# Patient Record
Sex: Female | Born: 1935
Health system: Southern US, Community
[De-identification: ages and names within clinical notes are randomized; demographics above are authoritative.]

## PROBLEM LIST (undated history)

## (undated) DIAGNOSIS — M51369 Other intervertebral disc degeneration, lumbar region without mention of lumbar back pain or lower extremity pain: Secondary | ICD-10-CM

## (undated) DIAGNOSIS — M5030 Other cervical disc degeneration, unspecified cervical region: Secondary | ICD-10-CM

## (undated) DIAGNOSIS — G2581 Restless legs syndrome: Secondary | ICD-10-CM

## (undated) DIAGNOSIS — R7301 Impaired fasting glucose: Secondary | ICD-10-CM

## (undated) DIAGNOSIS — M47814 Spondylosis without myelopathy or radiculopathy, thoracic region: Secondary | ICD-10-CM

## (undated) DIAGNOSIS — H3530 Unspecified macular degeneration: Secondary | ICD-10-CM

## (undated) DIAGNOSIS — I10 Essential (primary) hypertension: Secondary | ICD-10-CM

## (undated) DIAGNOSIS — E785 Hyperlipidemia, unspecified: Secondary | ICD-10-CM

## (undated) DIAGNOSIS — M5136 Other intervertebral disc degeneration, lumbar region: Secondary | ICD-10-CM

## (undated) DIAGNOSIS — M797 Fibromyalgia: Secondary | ICD-10-CM

## (undated) DIAGNOSIS — E041 Nontoxic single thyroid nodule: Secondary | ICD-10-CM

## (undated) HISTORY — PX: CATARACT EXTRACTION: SUR2

## (undated) HISTORY — DX: Essential (primary) hypertension: I10

## (undated) HISTORY — DX: Other intervertebral disc degeneration, lumbar region: M51.36

## (undated) HISTORY — DX: Hyperlipidemia, unspecified: E78.5

## (undated) HISTORY — PX: TOTAL ABDOMINAL HYSTERECTOMY: SHX209

## (undated) HISTORY — DX: Fibromyalgia: M79.7

## (undated) HISTORY — DX: Restless legs syndrome: G25.81

## (undated) HISTORY — DX: Spondylosis without myelopathy or radiculopathy, thoracic region: M47.814

## (undated) HISTORY — DX: Impaired fasting glucose: R73.01

## (undated) HISTORY — DX: Other intervertebral disc degeneration, lumbar region without mention of lumbar back pain or lower extremity pain: M51.369

## (undated) HISTORY — DX: Unspecified macular degeneration: H35.30

## (undated) HISTORY — DX: Nontoxic single thyroid nodule: E04.1

## (undated) HISTORY — DX: Other cervical disc degeneration, unspecified cervical region: M50.30

---

## 1998-03-10 ENCOUNTER — Other Ambulatory Visit: Admission: RE | Admit: 1998-03-10 | Discharge: 1998-03-10 | Payer: Self-pay | Admitting: Obstetrics and Gynecology

## 1998-03-25 ENCOUNTER — Encounter: Payer: Self-pay | Admitting: Gynecology

## 1998-03-31 ENCOUNTER — Inpatient Hospital Stay (HOSPITAL_COMMUNITY): Admission: RE | Admit: 1998-03-31 | Discharge: 1998-04-02 | Payer: Self-pay | Admitting: Obstetrics and Gynecology

## 2001-07-05 ENCOUNTER — Other Ambulatory Visit: Admission: RE | Admit: 2001-07-05 | Discharge: 2001-07-05 | Payer: Self-pay | Admitting: Obstetrics and Gynecology

## 2001-09-10 ENCOUNTER — Encounter: Payer: Self-pay | Admitting: Cardiology

## 2001-09-10 ENCOUNTER — Encounter: Admission: RE | Admit: 2001-09-10 | Discharge: 2001-09-10 | Payer: Self-pay | Admitting: Cardiology

## 2002-09-24 ENCOUNTER — Encounter: Payer: Self-pay | Admitting: Cardiology

## 2002-09-24 ENCOUNTER — Encounter: Admission: RE | Admit: 2002-09-24 | Discharge: 2002-09-24 | Payer: Self-pay | Admitting: Cardiology

## 2014-07-15 DIAGNOSIS — I10 Essential (primary) hypertension: Secondary | ICD-10-CM | POA: Diagnosis not present

## 2014-07-15 DIAGNOSIS — E041 Nontoxic single thyroid nodule: Secondary | ICD-10-CM | POA: Diagnosis not present

## 2014-07-15 DIAGNOSIS — E119 Type 2 diabetes mellitus without complications: Secondary | ICD-10-CM | POA: Diagnosis not present

## 2014-07-15 DIAGNOSIS — E785 Hyperlipidemia, unspecified: Secondary | ICD-10-CM | POA: Diagnosis not present

## 2014-11-27 DIAGNOSIS — H3531 Nonexudative age-related macular degeneration: Secondary | ICD-10-CM | POA: Diagnosis not present

## 2014-11-27 DIAGNOSIS — Z961 Presence of intraocular lens: Secondary | ICD-10-CM | POA: Diagnosis not present

## 2014-11-27 DIAGNOSIS — H26492 Other secondary cataract, left eye: Secondary | ICD-10-CM | POA: Diagnosis not present

## 2014-11-27 DIAGNOSIS — H35341 Macular cyst, hole, or pseudohole, right eye: Secondary | ICD-10-CM | POA: Diagnosis not present

## 2015-01-19 DIAGNOSIS — Z9181 History of falling: Secondary | ICD-10-CM | POA: Diagnosis not present

## 2015-01-19 DIAGNOSIS — E041 Nontoxic single thyroid nodule: Secondary | ICD-10-CM | POA: Diagnosis not present

## 2015-01-19 DIAGNOSIS — E119 Type 2 diabetes mellitus without complications: Secondary | ICD-10-CM | POA: Diagnosis not present

## 2015-01-19 DIAGNOSIS — Z6825 Body mass index (BMI) 25.0-25.9, adult: Secondary | ICD-10-CM | POA: Diagnosis not present

## 2015-01-19 DIAGNOSIS — E785 Hyperlipidemia, unspecified: Secondary | ICD-10-CM | POA: Diagnosis not present

## 2015-01-19 DIAGNOSIS — Z139 Encounter for screening, unspecified: Secondary | ICD-10-CM | POA: Diagnosis not present

## 2015-01-19 DIAGNOSIS — Z1389 Encounter for screening for other disorder: Secondary | ICD-10-CM | POA: Diagnosis not present

## 2015-01-19 DIAGNOSIS — I10 Essential (primary) hypertension: Secondary | ICD-10-CM | POA: Diagnosis not present

## 2015-03-20 DIAGNOSIS — Z23 Encounter for immunization: Secondary | ICD-10-CM | POA: Diagnosis not present

## 2015-07-27 DIAGNOSIS — Z6825 Body mass index (BMI) 25.0-25.9, adult: Secondary | ICD-10-CM | POA: Diagnosis not present

## 2015-07-27 DIAGNOSIS — E041 Nontoxic single thyroid nodule: Secondary | ICD-10-CM | POA: Diagnosis not present

## 2015-07-27 DIAGNOSIS — R7301 Impaired fasting glucose: Secondary | ICD-10-CM | POA: Diagnosis not present

## 2015-07-27 DIAGNOSIS — I10 Essential (primary) hypertension: Secondary | ICD-10-CM | POA: Diagnosis not present

## 2015-07-27 DIAGNOSIS — E785 Hyperlipidemia, unspecified: Secondary | ICD-10-CM | POA: Diagnosis not present

## 2015-11-19 DIAGNOSIS — L820 Inflamed seborrheic keratosis: Secondary | ICD-10-CM | POA: Diagnosis not present

## 2015-12-10 DIAGNOSIS — H353131 Nonexudative age-related macular degeneration, bilateral, early dry stage: Secondary | ICD-10-CM | POA: Diagnosis not present

## 2015-12-10 DIAGNOSIS — H35341 Macular cyst, hole, or pseudohole, right eye: Secondary | ICD-10-CM | POA: Diagnosis not present

## 2015-12-10 DIAGNOSIS — Z961 Presence of intraocular lens: Secondary | ICD-10-CM | POA: Diagnosis not present

## 2016-01-25 DIAGNOSIS — Z1389 Encounter for screening for other disorder: Secondary | ICD-10-CM | POA: Diagnosis not present

## 2016-01-25 DIAGNOSIS — I10 Essential (primary) hypertension: Secondary | ICD-10-CM | POA: Diagnosis not present

## 2016-01-25 DIAGNOSIS — R7301 Impaired fasting glucose: Secondary | ICD-10-CM | POA: Diagnosis not present

## 2016-01-25 DIAGNOSIS — E041 Nontoxic single thyroid nodule: Secondary | ICD-10-CM | POA: Diagnosis not present

## 2016-01-25 DIAGNOSIS — E785 Hyperlipidemia, unspecified: Secondary | ICD-10-CM | POA: Diagnosis not present

## 2016-01-25 DIAGNOSIS — Z9181 History of falling: Secondary | ICD-10-CM | POA: Diagnosis not present

## 2016-04-08 DIAGNOSIS — Z23 Encounter for immunization: Secondary | ICD-10-CM | POA: Diagnosis not present

## 2016-07-27 DIAGNOSIS — E041 Nontoxic single thyroid nodule: Secondary | ICD-10-CM | POA: Diagnosis not present

## 2016-07-27 DIAGNOSIS — Z6825 Body mass index (BMI) 25.0-25.9, adult: Secondary | ICD-10-CM | POA: Diagnosis not present

## 2016-07-27 DIAGNOSIS — E785 Hyperlipidemia, unspecified: Secondary | ICD-10-CM | POA: Diagnosis not present

## 2016-07-27 DIAGNOSIS — I10 Essential (primary) hypertension: Secondary | ICD-10-CM | POA: Diagnosis not present

## 2016-07-27 DIAGNOSIS — R7301 Impaired fasting glucose: Secondary | ICD-10-CM | POA: Diagnosis not present

## 2016-12-29 DIAGNOSIS — H353132 Nonexudative age-related macular degeneration, bilateral, intermediate dry stage: Secondary | ICD-10-CM | POA: Diagnosis not present

## 2016-12-29 DIAGNOSIS — H43811 Vitreous degeneration, right eye: Secondary | ICD-10-CM | POA: Diagnosis not present

## 2016-12-29 DIAGNOSIS — Z961 Presence of intraocular lens: Secondary | ICD-10-CM | POA: Diagnosis not present

## 2016-12-29 DIAGNOSIS — H35341 Macular cyst, hole, or pseudohole, right eye: Secondary | ICD-10-CM | POA: Diagnosis not present

## 2017-01-25 DIAGNOSIS — Z139 Encounter for screening, unspecified: Secondary | ICD-10-CM | POA: Diagnosis not present

## 2017-01-25 DIAGNOSIS — I10 Essential (primary) hypertension: Secondary | ICD-10-CM | POA: Diagnosis not present

## 2017-01-25 DIAGNOSIS — Z6824 Body mass index (BMI) 24.0-24.9, adult: Secondary | ICD-10-CM | POA: Diagnosis not present

## 2017-01-25 DIAGNOSIS — R7301 Impaired fasting glucose: Secondary | ICD-10-CM | POA: Diagnosis not present

## 2017-01-25 DIAGNOSIS — E785 Hyperlipidemia, unspecified: Secondary | ICD-10-CM | POA: Diagnosis not present

## 2017-01-25 DIAGNOSIS — E041 Nontoxic single thyroid nodule: Secondary | ICD-10-CM | POA: Diagnosis not present

## 2017-05-04 DIAGNOSIS — Z23 Encounter for immunization: Secondary | ICD-10-CM | POA: Diagnosis not present

## 2017-07-31 DIAGNOSIS — R7301 Impaired fasting glucose: Secondary | ICD-10-CM | POA: Diagnosis not present

## 2017-07-31 DIAGNOSIS — Z6824 Body mass index (BMI) 24.0-24.9, adult: Secondary | ICD-10-CM | POA: Diagnosis not present

## 2017-07-31 DIAGNOSIS — E785 Hyperlipidemia, unspecified: Secondary | ICD-10-CM | POA: Diagnosis not present

## 2017-07-31 DIAGNOSIS — I10 Essential (primary) hypertension: Secondary | ICD-10-CM | POA: Diagnosis not present

## 2017-07-31 DIAGNOSIS — E041 Nontoxic single thyroid nodule: Secondary | ICD-10-CM | POA: Diagnosis not present

## 2017-07-31 DIAGNOSIS — Z9181 History of falling: Secondary | ICD-10-CM | POA: Diagnosis not present

## 2017-07-31 DIAGNOSIS — Z1389 Encounter for screening for other disorder: Secondary | ICD-10-CM | POA: Diagnosis not present

## 2017-07-31 DIAGNOSIS — R079 Chest pain, unspecified: Secondary | ICD-10-CM | POA: Diagnosis not present

## 2017-07-31 DIAGNOSIS — Z1331 Encounter for screening for depression: Secondary | ICD-10-CM | POA: Diagnosis not present

## 2017-08-14 DIAGNOSIS — K219 Gastro-esophageal reflux disease without esophagitis: Secondary | ICD-10-CM | POA: Diagnosis not present

## 2017-08-14 DIAGNOSIS — R42 Dizziness and giddiness: Secondary | ICD-10-CM | POA: Diagnosis not present

## 2017-09-15 DIAGNOSIS — G2581 Restless legs syndrome: Secondary | ICD-10-CM | POA: Insufficient documentation

## 2017-09-15 DIAGNOSIS — M5030 Other cervical disc degeneration, unspecified cervical region: Secondary | ICD-10-CM | POA: Insufficient documentation

## 2017-09-15 DIAGNOSIS — E782 Mixed hyperlipidemia: Secondary | ICD-10-CM | POA: Insufficient documentation

## 2017-09-15 DIAGNOSIS — I10 Essential (primary) hypertension: Secondary | ICD-10-CM | POA: Insufficient documentation

## 2017-09-15 DIAGNOSIS — M47814 Spondylosis without myelopathy or radiculopathy, thoracic region: Secondary | ICD-10-CM | POA: Insufficient documentation

## 2017-09-15 DIAGNOSIS — E041 Nontoxic single thyroid nodule: Secondary | ICD-10-CM | POA: Insufficient documentation

## 2017-09-15 DIAGNOSIS — M797 Fibromyalgia: Secondary | ICD-10-CM | POA: Insufficient documentation

## 2017-09-15 DIAGNOSIS — M5136 Other intervertebral disc degeneration, lumbar region: Secondary | ICD-10-CM | POA: Insufficient documentation

## 2017-09-15 DIAGNOSIS — R7301 Impaired fasting glucose: Secondary | ICD-10-CM | POA: Insufficient documentation

## 2017-09-15 DIAGNOSIS — H3530 Unspecified macular degeneration: Secondary | ICD-10-CM | POA: Insufficient documentation

## 2017-09-19 DIAGNOSIS — E785 Hyperlipidemia, unspecified: Secondary | ICD-10-CM | POA: Diagnosis not present

## 2017-09-19 DIAGNOSIS — N959 Unspecified menopausal and perimenopausal disorder: Secondary | ICD-10-CM | POA: Diagnosis not present

## 2017-09-19 DIAGNOSIS — Z0000 Encounter for general adult medical examination without abnormal findings: Secondary | ICD-10-CM | POA: Diagnosis not present

## 2017-09-19 DIAGNOSIS — Z9181 History of falling: Secondary | ICD-10-CM | POA: Diagnosis not present

## 2017-09-19 DIAGNOSIS — Z1331 Encounter for screening for depression: Secondary | ICD-10-CM | POA: Diagnosis not present

## 2017-09-19 DIAGNOSIS — Z1231 Encounter for screening mammogram for malignant neoplasm of breast: Secondary | ICD-10-CM | POA: Diagnosis not present

## 2017-09-19 DIAGNOSIS — Z136 Encounter for screening for cardiovascular disorders: Secondary | ICD-10-CM | POA: Diagnosis not present

## 2017-09-26 ENCOUNTER — Encounter: Payer: Self-pay | Admitting: Internal Medicine

## 2017-09-26 ENCOUNTER — Ambulatory Visit: Payer: Medicare HMO | Admitting: Internal Medicine

## 2017-09-26 VITALS — BP 156/62 | HR 76 | Ht 59.0 in | Wt 131.2 lb

## 2017-09-26 DIAGNOSIS — R079 Chest pain, unspecified: Secondary | ICD-10-CM | POA: Diagnosis not present

## 2017-09-26 DIAGNOSIS — E782 Mixed hyperlipidemia: Secondary | ICD-10-CM

## 2017-09-26 DIAGNOSIS — I10 Essential (primary) hypertension: Secondary | ICD-10-CM | POA: Diagnosis not present

## 2017-09-26 HISTORY — DX: Chest pain, unspecified: R07.9

## 2017-09-26 MED ORDER — CARVEDILOL 12.5 MG PO TABS: 12.5000 mg | ORAL_TABLET | Freq: Two times a day (BID) | ORAL | 5 refills | Status: DC

## 2017-09-26 NOTE — Progress Notes (Signed)
OFFICE CONSULT NOTE  Chief Complaint:  Chest pain  Primary Care Physician: Karen Fusi, MD  HPI:  Karen Mendez is a 84 y.o. female who is being seen today for the evaluation of chest pain at the request of Karen Fusi, MD. This is a pleasant female with a history of HTN, fibromyalgia, and chronic back pain who presents for evaluation of chest pain. She reports over the past several weeks having increased chest pressure.  She says sometimes is worse after eating, but has noticed that when walking up and down her driveway.  She does have significant chronic pain.  She has not been interested in taking any medications for it.  She has long-standing hypertension has been on labetalol and amiloride.  Recently her PCP started her on low-dose aspirin and set her up for evaluation.  EKG today shows sinus rhythm without ischemic changes and first-degree AV block.  He felt that her symptoms might be related to reflux and recommended omeprazole, but she was hesitant to take the medication.  Family history significant for heart disease in both parents which she is at live by more than 20 years.  Recent lab work was reviewed from March 2019 showed total cholesterol 210, HDL 68, LDL 120 and triglycerides 782.  PMHx:  Past Medical History:  Diagnosis Date  . Degeneration of cervical intervertebral disc   . Elevated fasting blood sugar   . Fibromyalgia   . Hyperlipidemia   . Hypertension   . Lumbar disc narrowing   . Macular degeneration   . Restless leg syndrome   . Thoracic spondylosis   . Uninodular goiter     FAMHx:  Family History  Problem Relation Age of Onset  . Heart attack Mother   . Hypertension Mother   . Heart attack Father     SOCHx:   reports that she has never smoked. She has never used smokeless tobacco. She reports that she does not drink alcohol or use drugs.  ALLERGIES:  Allergies  Allergen Reactions  . Etodolac Other (See Comments)  . Nefazodone Other  (See Comments)  . Penicillins Rash  . Sulfamethoxazole Rash    ROS: Pertinent items noted in HPI and remainder of comprehensive ROS otherwise negative.  HOME MEDS: Current Outpatient Medications on File Prior to Visit  Medication Sig Dispense Refill  . amiloride-hydrochlorothiazide (MODURETIC) 5-50 MG tablet Take 1 tablet by mouth daily.    Marland Kitchen aspirin EC 81 MG tablet Take 81 mg by mouth daily.    . Calcium Carbonate-Vitamin D (CALTRATE 600+D) 600-400 MG-UNIT tablet Take 1 tablet by mouth daily.    . Multiple Vitamins-Minerals (PRESERVISION/LUTEIN PO) Take 1 capsule by mouth 2 (two) times daily.     No current facility-administered medications on file prior to visit.     LABS/IMAGING: No results found for this or any previous visit (from the past 48 hour(s)). No results found.  LIPID PANEL: No results found for: CHOL, TRIG, HDL, CHOLHDL, VLDL, LDLCALC, LDLDIRECT  WEIGHTS: Wt Readings from Last 3 Encounters:  09/26/17 131 lb 3.2 oz (59.5 kg)    VITALS: BP (!) 156/62 (BP Location: Right Arm)   Pulse 76   Ht  (1.499 m)   Wt 131 lb 3.2 oz (59.5 kg)   BMI 26.50 kg/m   EXAM: General appearance: alert, appears stated age and no distress Neck: no carotid bruit, no JVD and thyroid not enlarged, symmetric, no tenderness/mass/nodules Lungs: clear to auscultation bilaterally Heart: regular rate and  rhythm Abdomen: soft, non-tender; bowel sounds normal; no masses,  no organomegaly Extremities: extremities normal, atraumatic, no cyanosis or edema Pulses: 2+ and symmetric Skin: Skin color, texture, turgor normal. No rashes or lesions Neurologic: Grossly normal Psych: Pleasant  EKG: Sinus rhythm with sinus arrhythmia first-degree AV block at 76- personally reviewed  ASSESSMENT: 1. Atypical chest pain 2. Possible GERD 3. Uncontrolled hypertension  PLAN: 1.   Karen Mendez is describing somewhat atypical chest pain.  The typical features are that it somewhat worse with  exertion relieved by rest, but also occurs without exertion, sometimes after eating and has been present recently more frequently.  Some of the symptoms might be significant for GERD and I agree that she should try medication, but she was reluctant to take a PPI after reading side effects.  I recommended an H2 blocker over-the-counter.  Blood pressure is not at goal today.  She may benefit from more cardioselective beta-blocker such as carvedilol.  I recommend stopping labetalol and switching her to carvedilol 12.5 mg twice daily.  I encouraged her to take daily aspirin.  She may also benefit from low-dose statin therapy, but she is not interested at this time.  I recommended against noninvasive stress testing as she is not a good candidate for cardiac catheterization given her age and we would not likely pursue additional testing if she had abnormal stress testing.  If this is angina, medical therapy would be preferred.  Thanks for the kind referral.  Plan to see her back in 1 to 2 months and will reassess her symptoms at that time.  Karen Nose, MD, [facility], FACP  [facility]  Medical Director of the Advanced Lipid Disorders &  Cardiovascular Risk Reduction Clinic Diplomate of the American Board of Clinical Lipidology Attending Cardiologist  Direct Dial: [phone]  Fax: [phone]  Website:  [email or web address]  Karen Mendez 09/26/2017, 1:12 PM

## 2017-09-26 NOTE — Patient Instructions (Addendum)
Medication Instructions:   STOP labetalol  START carvedilol 12.5mg  twice daily TAKE aspirin  every day TRY pepcid or zantac daily - over the counter   Labwork:  NONE  Testing/Procedures:  NONE  Follow-Up:  Your physician recommends that you schedule a follow-up appointment in: TWO MONTHS with Dr. Rennis Golden.   If you need a refill on your cardiac medications before your next appointment, please call your pharmacy.  Any Other Special Instructions Will Be Listed Below (If Applicable).

## 2017-10-03 ENCOUNTER — Encounter: Payer: Self-pay | Admitting: *Deleted

## 2017-11-29 ENCOUNTER — Ambulatory Visit: Payer: Medicare HMO | Admitting: Internal Medicine

## 2018-01-24 ENCOUNTER — Ambulatory Visit: Payer: Medicare HMO | Admitting: Internal Medicine

## 2018-01-24 ENCOUNTER — Encounter: Payer: Self-pay | Admitting: Internal Medicine

## 2018-01-24 VITALS — BP 122/70 | HR 69 | Ht 63.0 in | Wt 133.2 lb

## 2018-01-24 DIAGNOSIS — I2089 Other forms of angina pectoris: Secondary | ICD-10-CM

## 2018-01-24 DIAGNOSIS — E782 Mixed hyperlipidemia: Secondary | ICD-10-CM

## 2018-01-24 DIAGNOSIS — I10 Essential (primary) hypertension: Secondary | ICD-10-CM | POA: Diagnosis not present

## 2018-01-24 DIAGNOSIS — I208 Other forms of angina pectoris: Secondary | ICD-10-CM

## 2018-01-24 NOTE — Progress Notes (Signed)
OFFICE CONSULT NOTE  Chief Complaint:  Chest pain  Primary Care Physician: [name], MD  HPI:  Karen Mendez is a 84 y.o. female who is being seen today for the evaluation of chest pain at the request of [name], MD. This is a pleasant female with a history of HTN, fibromyalgia, and chronic back pain who presents for evaluation of chest pain. She reports over the past several weeks having increased chest pressure.  She says sometimes is worse after eating, but has noticed that when walking up and down her driveway.  She does have significant chronic pain.  She has not been interested in taking any medications for it.  She has long-standing hypertension has been on labetalol and amiloride.  Recently her PCP started her on low-dose aspirin and set her up for evaluation.  EKG today shows sinus rhythm without ischemic changes and first-degree AV block.  He felt that her symptoms might be related to reflux and recommended omeprazole, but she was hesitant to take the medication.  Family history significant for heart disease in both parents which she is at live by more than 20 years.  Recent lab work was reviewed from March 2019 showed total cholesterol 210, HDL 68, LDL 120 and triglycerides 130.  01/25/2018  Karen Mendez returns today for follow-up.  Overall she seems to be doing well.  She occasionally gets some chest discomfort which may be her fibromyalgia.  EKG today shows a sinus rhythm at 69 with small P waves.  No new ischemic changes.  Blood pressures well controlled.  She is on carvedilol and combination amiloride/hydrochlorothiazide.  Also takes low-dose aspirin.   PMHx:  Past Medical History:  Diagnosis Date  . Degeneration of cervical intervertebral disc   . Elevated fasting blood sugar   . Fibromyalgia   . Hyperlipidemia   . Hypertension   . Lumbar disc narrowing   . Macular degeneration   . Restless leg syndrome   . Thoracic spondylosis   . Uninodular  goiter     FAMHx:  Family History  Problem Relation Age of Onset  . Heart attack Mother   . Hypertension Mother   . Heart attack Father   . Congestive Heart Failure Sister   . Lung cancer Sister   . Melanoma Child     SOCHx:   reports that she has never smoked. She has never used smokeless tobacco. She reports that she does not drink alcohol or use drugs.  ALLERGIES:  Allergies  Allergen Reactions  . Etodolac Other (See Comments)  . Nefazodone Other (See Comments)  . Penicillins Rash  . Sulfamethoxazole Rash    ROS: Pertinent items noted in HPI and remainder of comprehensive ROS otherwise negative.  HOME MEDS: Current Outpatient Medications on File Prior to Visit  Medication Sig Dispense Refill  . amiloride-hydrochlorothiazide (MODURETIC) 5-50 MG tablet Take 1 tablet by mouth daily.    Marland Kitchen aspirin EC 81 MG tablet Take 81 mg by mouth daily.    . Calcium Carbonate-Vitamin D (CALTRATE 600+D) 600-400 MG-UNIT tablet Take 1 tablet by mouth daily.    . carvedilol (COREG) 12.5 MG tablet Take 1 tablet (12.5 mg total) by mouth 2 (two) times daily. 60 tablet 5  . Multiple Vitamins-Minerals (PRESERVISION/LUTEIN PO) Take 1 capsule by mouth 2 (two) times daily.     No current facility-administered medications on file prior to visit.     LABS/IMAGING: No results found for this or any previous visit (from the  past 48 hour(s)). No results found.  LIPID PANEL: No results found for: CHOL, TRIG, HDL, CHOLHDL, VLDL, LDLCALC, LDLDIRECT  WEIGHTS: Wt Readings from Last 3 Encounters:  01/24/18 133 lb 3.2 oz (60.4 kg)  09/26/17 131 lb 3.2 oz (59.5 kg)    VITALS: BP 122/70   Pulse 69   Ht 5\' 3"  (1.6 m)   Wt 133 lb 3.2 oz (60.4 kg)   BMI 23.60 kg/m   EXAM: General appearance: alert, appears stated age and no distress Neck: no carotid bruit, no JVD and thyroid not enlarged, symmetric, no tenderness/mass/nodules Lungs: clear to auscultation bilaterally Heart: regular rate and  rhythm Abdomen: soft, non-tender; bowel sounds normal; no masses,  no organomegaly Extremities: extremities normal, atraumatic, no cyanosis or edema Pulses: 2+ and symmetric Skin: Skin color, texture, turgor normal. No rashes or lesions Neurologic: Grossly normal Psych: Pleasant  EKG: Sinus rhythm with with poor R wave progression anteriorly at 69-personally reviewed  ASSESSMENT: 1. Atypical chest pain 2. Possible GERD 3. Hypertension  PLAN: 1.   Karen Mendez is really not describing any more chest pain.  I suspect this may be related to her fibromyalgia, however, cannot r/o ischemia given some exertional component. She is not a cath candidate and is not interested in an ischemia work-up.  She had some possible reflux symptoms which is being evaluated.  Her blood pressure is now much better controlled.  We will continue her current medications and she can see me back months or sooner as necessary.  Chrystie NoseKenneth C. Jaaziah Schulke, MD, [facility] SystemFACC, FACP  [place]  [facility]  Medical Director of the Advanced Lipid Disorders &  Cardiovascular Risk Reduction Clinic Diplomate of the American Board of Clinical Lipidology Attending Cardiologist  Direct Dial: [phone]  Fax: [phone]  Website:  [email or web address]  Hernan Turnage C Ahmar Pickrell 01/24/2018, 4:21 PM

## 2018-01-24 NOTE — Patient Instructions (Signed)
Your physician wants you to follow-up in: 6 months with Dr. Hilty. You will receive a reminder letter in the mail two months in advance. If you don't receive a letter, please call our office to schedule the follow-up appointment.    

## 2018-01-25 ENCOUNTER — Encounter: Payer: Self-pay | Admitting: Internal Medicine

## 2018-01-25 DIAGNOSIS — I208 Other forms of angina pectoris: Secondary | ICD-10-CM | POA: Insufficient documentation

## 2018-01-25 DIAGNOSIS — I2089 Other forms of angina pectoris: Secondary | ICD-10-CM

## 2018-01-25 HISTORY — DX: Other forms of angina pectoris: I20.89

## 2018-01-25 HISTORY — DX: Other forms of angina pectoris: I20.8

## 2018-02-06 DIAGNOSIS — R079 Chest pain, unspecified: Secondary | ICD-10-CM | POA: Diagnosis not present

## 2018-02-06 DIAGNOSIS — Z139 Encounter for screening, unspecified: Secondary | ICD-10-CM | POA: Diagnosis not present

## 2018-02-06 DIAGNOSIS — Z1339 Encounter for screening examination for other mental health and behavioral disorders: Secondary | ICD-10-CM | POA: Diagnosis not present

## 2018-02-06 DIAGNOSIS — E785 Hyperlipidemia, unspecified: Secondary | ICD-10-CM | POA: Diagnosis not present

## 2018-02-06 DIAGNOSIS — E041 Nontoxic single thyroid nodule: Secondary | ICD-10-CM | POA: Diagnosis not present

## 2018-02-06 DIAGNOSIS — I10 Essential (primary) hypertension: Secondary | ICD-10-CM | POA: Diagnosis not present

## 2018-02-06 DIAGNOSIS — R7301 Impaired fasting glucose: Secondary | ICD-10-CM | POA: Diagnosis not present

## 2018-03-08 DIAGNOSIS — H43811 Vitreous degeneration, right eye: Secondary | ICD-10-CM | POA: Diagnosis not present

## 2018-03-08 DIAGNOSIS — H26492 Other secondary cataract, left eye: Secondary | ICD-10-CM | POA: Diagnosis not present

## 2018-03-08 DIAGNOSIS — H35341 Macular cyst, hole, or pseudohole, right eye: Secondary | ICD-10-CM | POA: Diagnosis not present

## 2018-03-08 DIAGNOSIS — Z961 Presence of intraocular lens: Secondary | ICD-10-CM | POA: Diagnosis not present

## 2018-03-08 DIAGNOSIS — H353132 Nonexudative age-related macular degeneration, bilateral, intermediate dry stage: Secondary | ICD-10-CM | POA: Diagnosis not present

## 2018-03-23 ENCOUNTER — Other Ambulatory Visit: Payer: Self-pay | Admitting: Internal Medicine

## 2018-09-19 ENCOUNTER — Other Ambulatory Visit: Payer: Self-pay | Admitting: Internal Medicine

## 2018-09-19 NOTE — Telephone Encounter (Signed)
Carvedilol 12.5 mg refilled. 

## 2018-09-24 DIAGNOSIS — Z0000 Encounter for general adult medical examination without abnormal findings: Secondary | ICD-10-CM | POA: Diagnosis not present

## 2018-09-24 DIAGNOSIS — Z136 Encounter for screening for cardiovascular disorders: Secondary | ICD-10-CM | POA: Diagnosis not present

## 2018-09-24 DIAGNOSIS — Z1331 Encounter for screening for depression: Secondary | ICD-10-CM | POA: Diagnosis not present

## 2018-09-24 DIAGNOSIS — E785 Hyperlipidemia, unspecified: Secondary | ICD-10-CM | POA: Diagnosis not present

## 2018-09-24 DIAGNOSIS — Z9181 History of falling: Secondary | ICD-10-CM | POA: Diagnosis not present

## 2019-03-20 ENCOUNTER — Other Ambulatory Visit: Payer: Self-pay | Admitting: Internal Medicine

## 2019-03-20 MED ORDER — CARVEDILOL 12.5 MG PO TABS: 12.5000 mg | ORAL_TABLET | Freq: Two times a day (BID) | ORAL | 0 refills | Status: DC

## 2019-07-28 ENCOUNTER — Other Ambulatory Visit: Payer: Self-pay | Admitting: Internal Medicine

## 2019-07-30 ENCOUNTER — Telehealth: Payer: Self-pay | Admitting: Internal Medicine

## 2019-07-30 MED ORDER — CARVEDILOL 12.5 MG PO TABS: 12.5000 mg | ORAL_TABLET | Freq: Two times a day (BID) | ORAL | 0 refills | Status: DC

## 2019-07-30 NOTE — Telephone Encounter (Signed)
New message   Patient needs a new prescription for carvedilol (COREG) 12.5 MG tablet sent to [id number] - [place], [place] - [address]

## 2019-07-30 NOTE — Telephone Encounter (Signed)
Spoke with pt daughter, aware she will need to make an appointment for refills. She reports the patient is unable to leave the home due to pain and age. Virtual visit scheduled for 09/02/19 with dr Rennis Golden. Daughter reports she is hard of hearing but she will be there to assist with appointment. Refills to last until appointment sent to the pharmacy.

## 2019-09-02 ENCOUNTER — Telehealth (INDEPENDENT_AMBULATORY_CARE_PROVIDER_SITE_OTHER): Payer: Medicare HMO | Admitting: Internal Medicine

## 2019-09-02 ENCOUNTER — Encounter: Payer: Self-pay | Admitting: Internal Medicine

## 2019-09-02 VITALS — Ht 63.0 in | Wt 125.0 lb

## 2019-09-02 DIAGNOSIS — M797 Fibromyalgia: Secondary | ICD-10-CM

## 2019-09-02 DIAGNOSIS — M47814 Spondylosis without myelopathy or radiculopathy, thoracic region: Secondary | ICD-10-CM

## 2019-09-02 DIAGNOSIS — I208 Other forms of angina pectoris: Secondary | ICD-10-CM

## 2019-09-02 DIAGNOSIS — I10 Essential (primary) hypertension: Secondary | ICD-10-CM

## 2019-09-02 DIAGNOSIS — E782 Mixed hyperlipidemia: Secondary | ICD-10-CM

## 2019-09-02 NOTE — Progress Notes (Signed)
Virtual Visit via Telephone Note   This visit type was conducted due to national recommendations for restrictions regarding the COVID-19 Pandemic (e.g. social distancing) in an effort to limit this patient's exposure and mitigate transmission in our community.  Due to her co-morbid illnesses, this patient is at least at moderate risk for complications without adequate follow up.  This format is felt to be most appropriate for this patient at this time.  The patient did not have access to video technology/had technical difficulties with video requiring transitioning to audio format only (telephone).  All issues noted in this document were discussed and addressed.  No physical exam could be performed with this format.  Please refer to the patient's chart for her  consent to telehealth for [facility].   Evaluation Performed:  Telephone follow-up  Date:  09/02/2019   ID:  Karen Mendez, DOB 02/11/1923, MRN [id number]  Patient Location:  [address] [place] [address]  Provider location:   [address] [place], [place] [address]  PCP:  [name], MD  Cardiologist:  No primary care provider on file. Electrophysiologist:  None   Chief Complaint:  Torso burning  History of Present Illness:    Karen Mendez is a 86 y.o. female who presents via audio/video conferencing for a telehealth visit today.  Karen Mendez returns today for follow-up via telephone visit.  Overall she says she is doing well except she occasionally gets symptoms of burning across her torso.  She denies any rash or any lesion and does have a history of fibromyalgia.  She also has a thoracic spondylolysis, and therefore possibly could have been radiculopathy.  She has not discussed this with her primary provider and generally says she prays to have relief from the pain.  She is not able to travel into the office.  She was not able to get her blood pressure checked today.  She remains on blood  pressure medications.  I encouraged her to follow-up with her PCP but she reports she has not seen her primary care in some time.  The patient does not have symptoms concerning for COVID-19 infection (fever, chills, cough, or new SHORTNESS OF BREATH).    Prior CV studies:   The following studies were reviewed today:  Chart reviewed  PMHx:  Past Medical History:  Diagnosis Date  . Degeneration of cervical intervertebral disc   . Elevated fasting blood sugar   . Fibromyalgia   . Hyperlipidemia   . Hypertension   . Lumbar disc narrowing   . Macular degeneration   . Restless leg syndrome   . Thoracic spondylosis   . Uninodular goiter     Past Surgical History:  Procedure Laterality Date  . CATARACT EXTRACTION Right   . CATARACT EXTRACTION Left   . TOTAL ABDOMINAL HYSTERECTOMY      FAMHx:  Family History  Problem Relation Age of Onset  . Heart attack Mother   . Hypertension Mother   . Heart attack Father   . Congestive Heart Failure Sister   . Lung cancer Sister   . Melanoma Child     SOCHx:   reports that she has never smoked. She has never used smokeless tobacco. She reports that she does not drink alcohol or use drugs.  ALLERGIES:  Allergies  Allergen Reactions  . Etodolac Other (See Comments)  . Nefazodone Other (See Comments)  . Penicillins Rash  . Sulfamethoxazole Rash    MEDS:  Current Meds  Medication Sig  . amiloride-hydrochlorothiazide (MODURETIC) 5-50 MG tablet Take 1 tablet by mouth daily.  Marland Kitchen aspirin EC 81 MG tablet Take 81 mg by mouth daily.  . Calcium Carbonate-Vitamin D (CALTRATE 600+D) 600-400 MG-UNIT tablet Take 1 tablet by mouth daily.  . carvedilol (COREG) 12.5 MG tablet Take 1 tablet (12.5 mg total) by mouth 2 (two) times daily.  . Multiple Vitamins-Minerals (PRESERVISION/LUTEIN PO) Take 1 capsule by mouth 2 (two) times daily.     ROS: Pertinent items noted in HPI and remainder of comprehensive ROS otherwise negative.  Labs/Other  Tests and Data Reviewed:    Recent Labs: No results found for requested labs within last 8760 hours.   Recent Lipid Panel No results found for: CHOL, TRIG, HDL, CHOLHDL, LDLCALC, LDLDIRECT  Wt Readings from Last 3 Encounters:  09/02/19 125 lb (56.7 kg)  01/24/18 133 lb 3.2 oz (60.4 kg)  09/26/17 131 lb 3.2 oz (59.5 kg)     Exam:    Vital Signs:  Ht 5\' 3"  (1.6 m)   Wt 125 lb (56.7 kg)   BMI 22.14 kg/m    Exam deferred due to telephone visit  ASSESSMENT & PLAN:    1. Burning chest discomfort-possible stable angina 2. GERD 3. Hypertension  [name] describes persistent burning in her torso which might be stable angina versus GERD.  This has been a longstanding issue.  She also has hypertension but was not able to get a blood pressure check today.  She remains on blood pressure medications.  She has not seen her PCP for at least a year.  She is not able to get out of the house without family members.  Plan follow-up with me annually via virtual visit or sooner as necessary.  COVID-19 Education: The signs and symptoms of COVID-19 were discussed with the patient and how to seek care for testing (follow up with PCP or arrange E-visit).  The importance of social distancing was discussed today.  Patient Risk:   After full review of this patients clinical status, I feel that they are at least moderate risk at this time.  Time:   Today, I have spent 25 minutes with the patient with telehealth technology discussing chest discomfort, possible reflux symptoms, hypertension.     Medication Adjustments/Labs and Tests Ordered: Current medicines are reviewed at length with the patient today.  Concerns regarding medicines are outlined above.   Tests Ordered: No orders of the defined types were placed in this encounter.   Medication Changes: No orders of the defined types were placed in this encounter.   Disposition:  in 1 year(s)  Pixie Casino, MD, [facility] [place] Director of the Advanced Lipid Disorders &  Cardiovascular Risk Reduction Clinic Diplomate of the American Board of Clinical Lipidology Attending Cardiologist  Direct Dial: [phone]  Fax: [phone]  Website:  [email or web address]  Pixie Casino, MD  09/02/2019 1:38 PM

## 2019-09-02 NOTE — Patient Instructions (Addendum)
Medication Instructions:  Your physician recommends that you continue on your current medications as directed. Please refer to the Current Medication list given to you today.  *If you need a refill on your cardiac medications before your next appointment, please call your pharmacy*  Follow-Up: At [facility], you and your health needs are our priority.  As part of our continuing mission to provide you with exceptional heart care, we have created designated Provider Care Teams.  These Care Teams include your primary Cardiologist (physician) and Advanced Practice Providers (APPs -  Physician Assistants and Nurse Practitioners) who all work together to provide you with the care you need, when you need it.  We recommend signing up for the patient portal called "MyChart".  Sign up information is provided on this After Visit Summary.  MyChart is used to connect with patients for Virtual Visits (Telemedicine).  Patients are able to view lab/test results, encounter notes, upcoming appointments, etc.  Non-urgent messages can be sent to your provider as well.   To learn more about what you can do with MyChart, go to [email or web address].    Your next appointment:   12 month(s)  The format for your next appointment:   Either In Person or Virtual  Provider:   You may see Dr. Zoila Shutter or one of the following Advanced Practice Providers on your designated Care Team:    Azalee Course, PA-C  Micah Flesher, [place] or   Judy Pimple, [place]    Other Instructions

## 2019-11-01 ENCOUNTER — Encounter (HOSPITAL_COMMUNITY): Payer: Self-pay

## 2019-11-01 ENCOUNTER — Other Ambulatory Visit: Payer: Self-pay

## 2019-11-01 ENCOUNTER — Emergency Department (HOSPITAL_COMMUNITY)
Admission: EM | Admit: 2019-11-01 | Discharge: 2019-11-01 | Discharge status: Against Medical Advice | Payer: Medicare HMO | Source: Home / Self Care

## 2019-11-01 NOTE — ED Triage Notes (Signed)
Patient states she fell getting OOB last night and could not get up. Patient denies hitting her head or having LOC. Patient states she "hurts" all over."

## 2019-11-04 ENCOUNTER — Inpatient Hospital Stay (HOSPITAL_COMMUNITY)
Admission: EM | Admit: 2019-11-04 | Discharge: 2019-11-08 | DRG: 812 | Disposition: A | Discharge status: Skilled Nursing Facility | Payer: Medicare HMO | Attending: Family Medicine | Admitting: Family Medicine

## 2019-11-04 ENCOUNTER — Encounter (HOSPITAL_COMMUNITY): Payer: Self-pay

## 2019-11-04 ENCOUNTER — Emergency Department (HOSPITAL_COMMUNITY): Payer: Medicare HMO

## 2019-11-04 ENCOUNTER — Other Ambulatory Visit: Payer: Self-pay

## 2019-11-04 DIAGNOSIS — Z66 Do not resuscitate: Secondary | ICD-10-CM | POA: Diagnosis present

## 2019-11-04 DIAGNOSIS — Z79899 Other long term (current) drug therapy: Secondary | ICD-10-CM

## 2019-11-04 DIAGNOSIS — M549 Dorsalgia, unspecified: Secondary | ICD-10-CM | POA: Diagnosis present

## 2019-11-04 DIAGNOSIS — D531 Other megaloblastic anemias, not elsewhere classified: Principal | ICD-10-CM | POA: Diagnosis present

## 2019-11-04 DIAGNOSIS — M797 Fibromyalgia: Secondary | ICD-10-CM | POA: Diagnosis present

## 2019-11-04 DIAGNOSIS — Z743 Need for continuous supervision: Secondary | ICD-10-CM | POA: Diagnosis not present

## 2019-11-04 DIAGNOSIS — S2232XA Fracture of one rib, left side, initial encounter for closed fracture: Secondary | ICD-10-CM | POA: Diagnosis present

## 2019-11-04 DIAGNOSIS — D539 Nutritional anemia, unspecified: Secondary | ICD-10-CM

## 2019-11-04 DIAGNOSIS — S2232XD Fracture of one rib, left side, subsequent encounter for fracture with routine healing: Secondary | ICD-10-CM | POA: Diagnosis not present

## 2019-11-04 DIAGNOSIS — H3530 Unspecified macular degeneration: Secondary | ICD-10-CM | POA: Diagnosis present

## 2019-11-04 DIAGNOSIS — Y92009 Unspecified place in unspecified non-institutional (private) residence as the place of occurrence of the external cause: Secondary | ICD-10-CM | POA: Diagnosis not present

## 2019-11-04 DIAGNOSIS — I129 Hypertensive chronic kidney disease with stage 1 through stage 4 chronic kidney disease, or unspecified chronic kidney disease: Secondary | ICD-10-CM | POA: Diagnosis present

## 2019-11-04 DIAGNOSIS — W010XXA Fall on same level from slipping, tripping and stumbling without subsequent striking against object, initial encounter: Secondary | ICD-10-CM | POA: Diagnosis present

## 2019-11-04 DIAGNOSIS — Z808 Family history of malignant neoplasm of other organs or systems: Secondary | ICD-10-CM

## 2019-11-04 DIAGNOSIS — R296 Repeated falls: Secondary | ICD-10-CM | POA: Diagnosis not present

## 2019-11-04 DIAGNOSIS — R54 Age-related physical debility: Secondary | ICD-10-CM | POA: Diagnosis present

## 2019-11-04 DIAGNOSIS — Z20822 Contact with and (suspected) exposure to covid-19: Secondary | ICD-10-CM | POA: Diagnosis not present

## 2019-11-04 DIAGNOSIS — R519 Headache, unspecified: Secondary | ICD-10-CM | POA: Diagnosis not present

## 2019-11-04 DIAGNOSIS — N1832 Chronic kidney disease, stage 3b: Secondary | ICD-10-CM | POA: Diagnosis present

## 2019-11-04 DIAGNOSIS — D649 Anemia, unspecified: Secondary | ICD-10-CM | POA: Diagnosis not present

## 2019-11-04 DIAGNOSIS — E440 Moderate protein-calorie malnutrition: Secondary | ICD-10-CM | POA: Diagnosis present

## 2019-11-04 DIAGNOSIS — Z8249 Family history of ischemic heart disease and other diseases of the circulatory system: Secondary | ICD-10-CM

## 2019-11-04 DIAGNOSIS — Z681 Body mass index (BMI) 19 or less, adult: Secondary | ICD-10-CM

## 2019-11-04 DIAGNOSIS — M5030 Other cervical disc degeneration, unspecified cervical region: Secondary | ICD-10-CM | POA: Diagnosis not present

## 2019-11-04 DIAGNOSIS — Z7982 Long term (current) use of aspirin: Secondary | ICD-10-CM | POA: Diagnosis not present

## 2019-11-04 DIAGNOSIS — R279 Unspecified lack of coordination: Secondary | ICD-10-CM | POA: Diagnosis not present

## 2019-11-04 DIAGNOSIS — E538 Deficiency of other specified B group vitamins: Secondary | ICD-10-CM | POA: Diagnosis not present

## 2019-11-04 DIAGNOSIS — E785 Hyperlipidemia, unspecified: Secondary | ICD-10-CM | POA: Diagnosis present

## 2019-11-04 DIAGNOSIS — R0902 Hypoxemia: Secondary | ICD-10-CM | POA: Diagnosis not present

## 2019-11-04 DIAGNOSIS — Z801 Family history of malignant neoplasm of trachea, bronchus and lung: Secondary | ICD-10-CM

## 2019-11-04 DIAGNOSIS — W19XXXA Unspecified fall, initial encounter: Secondary | ICD-10-CM | POA: Diagnosis not present

## 2019-11-04 DIAGNOSIS — R531 Weakness: Secondary | ICD-10-CM | POA: Diagnosis not present

## 2019-11-04 DIAGNOSIS — S2242XA Multiple fractures of ribs, left side, initial encounter for closed fracture: Secondary | ICD-10-CM | POA: Diagnosis not present

## 2019-11-04 DIAGNOSIS — Z4789 Encounter for other orthopedic aftercare: Secondary | ICD-10-CM | POA: Diagnosis not present

## 2019-11-04 DIAGNOSIS — Z03818 Encounter for observation for suspected exposure to other biological agents ruled out: Secondary | ICD-10-CM | POA: Diagnosis not present

## 2019-11-04 DIAGNOSIS — M5136 Other intervertebral disc degeneration, lumbar region: Secondary | ICD-10-CM | POA: Diagnosis present

## 2019-11-04 DIAGNOSIS — J440 Chronic obstructive pulmonary disease with acute lower respiratory infection: Secondary | ICD-10-CM | POA: Diagnosis not present

## 2019-11-04 DIAGNOSIS — R1312 Dysphagia, oropharyngeal phase: Secondary | ICD-10-CM | POA: Diagnosis not present

## 2019-11-04 DIAGNOSIS — M519 Unspecified thoracic, thoracolumbar and lumbosacral intervertebral disc disorder: Secondary | ICD-10-CM | POA: Diagnosis not present

## 2019-11-04 DIAGNOSIS — I10 Essential (primary) hypertension: Secondary | ICD-10-CM | POA: Diagnosis present

## 2019-11-04 DIAGNOSIS — G63 Polyneuropathy in diseases classified elsewhere: Secondary | ICD-10-CM | POA: Diagnosis present

## 2019-11-04 DIAGNOSIS — R2681 Unsteadiness on feet: Secondary | ICD-10-CM | POA: Diagnosis not present

## 2019-11-04 DIAGNOSIS — G8929 Other chronic pain: Secondary | ICD-10-CM | POA: Diagnosis present

## 2019-11-04 DIAGNOSIS — I340 Nonrheumatic mitral (valve) insufficiency: Secondary | ICD-10-CM | POA: Diagnosis not present

## 2019-11-04 DIAGNOSIS — D638 Anemia in other chronic diseases classified elsewhere: Secondary | ICD-10-CM | POA: Diagnosis present

## 2019-11-04 DIAGNOSIS — M4802 Spinal stenosis, cervical region: Secondary | ICD-10-CM | POA: Diagnosis not present

## 2019-11-04 DIAGNOSIS — Z741 Need for assistance with personal care: Secondary | ICD-10-CM | POA: Diagnosis not present

## 2019-11-04 DIAGNOSIS — R5381 Other malaise: Secondary | ICD-10-CM | POA: Diagnosis not present

## 2019-11-04 DIAGNOSIS — S3993XA Unspecified injury of pelvis, initial encounter: Secondary | ICD-10-CM | POA: Diagnosis not present

## 2019-11-04 DIAGNOSIS — W19XXXD Unspecified fall, subsequent encounter: Secondary | ICD-10-CM | POA: Diagnosis not present

## 2019-11-04 DIAGNOSIS — R2689 Other abnormalities of gait and mobility: Secondary | ICD-10-CM | POA: Diagnosis not present

## 2019-11-04 DIAGNOSIS — R52 Pain, unspecified: Secondary | ICD-10-CM | POA: Diagnosis not present

## 2019-11-04 DIAGNOSIS — R0781 Pleurodynia: Secondary | ICD-10-CM | POA: Diagnosis not present

## 2019-11-04 DIAGNOSIS — M6281 Muscle weakness (generalized): Secondary | ICD-10-CM | POA: Diagnosis not present

## 2019-11-04 DIAGNOSIS — R41841 Cognitive communication deficit: Secondary | ICD-10-CM | POA: Diagnosis not present

## 2019-11-04 DIAGNOSIS — I959 Hypotension, unspecified: Secondary | ICD-10-CM | POA: Diagnosis not present

## 2019-11-04 LAB — IRON AND TIBC
Iron: 74 ug/dL (ref 28–170)
Saturation Ratios: 30 % (ref 10.4–31.8)
TIBC: 245 ug/dL — ABNORMAL LOW (ref 250–450)
UIBC: 171 ug/dL

## 2019-11-04 LAB — CBC WITH DIFFERENTIAL/PLATELET
Abs Immature Granulocytes: 0.05 10*3/uL (ref 0.00–0.07)
Basophils Absolute: 0 10*3/uL (ref 0.0–0.1)
Basophils Relative: 0 %
Eosinophils Absolute: 0 10*3/uL (ref 0.0–0.5)
Eosinophils Relative: 0 %
HCT: 16 % — ABNORMAL LOW (ref 36.0–46.0)
Hemoglobin: 5.4 g/dL — CL (ref 12.0–15.0)
Immature Granulocytes: 1 %
Lymphocytes Relative: 15 %
Lymphs Abs: 1 10*3/uL (ref 0.7–4.0)
MCH: 52.4 pg — ABNORMAL HIGH (ref 26.0–34.0)
MCHC: 33.8 g/dL (ref 30.0–36.0)
MCV: 155.3 fL — ABNORMAL HIGH (ref 80.0–100.0)
Monocytes Absolute: 0.2 10*3/uL (ref 0.1–1.0)
Monocytes Relative: 4 %
Neutro Abs: 5.3 10*3/uL (ref 1.7–7.7)
Neutrophils Relative %: 80 %
Platelets: 167 10*3/uL (ref 150–400)
RBC: 1.03 MIL/uL — ABNORMAL LOW (ref 3.87–5.11)
RDW: 19.8 % — ABNORMAL HIGH (ref 11.5–15.5)
WBC: 6.6 10*3/uL (ref 4.0–10.5)
nRBC: 0.5 % — ABNORMAL HIGH (ref 0.0–0.2)

## 2019-11-04 LAB — POC OCCULT BLOOD, ED: Fecal Occult Bld: NEGATIVE

## 2019-11-04 LAB — MAGNESIUM: Magnesium: 2.3 mg/dL (ref 1.7–2.4)

## 2019-11-04 LAB — COMPREHENSIVE METABOLIC PANEL
ALT: 13 U/L (ref 0–44)
AST: 18 U/L (ref 15–41)
Albumin: 3.7 g/dL (ref 3.5–5.0)
Alkaline Phosphatase: 33 U/L — ABNORMAL LOW (ref 38–126)
Anion gap: 9 (ref 5–15)
BUN: 41 mg/dL — ABNORMAL HIGH (ref 8–23)
CO2: 23 mmol/L (ref 22–32)
Calcium: 8.2 mg/dL — ABNORMAL LOW (ref 8.9–10.3)
Chloride: 106 mmol/L (ref 98–111)
Creatinine, Ser: 1.39 mg/dL — ABNORMAL HIGH (ref 0.44–1.00)
GFR calc Af Amer: 37 mL/min — ABNORMAL LOW (ref >60)
GFR calc non Af Amer: 32 mL/min — ABNORMAL LOW (ref >60)
Glucose, Bld: 126 mg/dL — ABNORMAL HIGH (ref 70–99)
Potassium: 4.3 mmol/L (ref 3.5–5.1)
Sodium: 138 mmol/L (ref 135–145)
Total Bilirubin: 2.4 mg/dL — ABNORMAL HIGH (ref 0.3–1.2)
Total Protein: 5.7 g/dL — ABNORMAL LOW (ref 6.5–8.1)

## 2019-11-04 LAB — RETICULOCYTES
Immature Retic Fract: 25.6 % — ABNORMAL HIGH (ref 2.3–15.9)
RBC.: 1.03 MIL/uL — ABNORMAL LOW (ref 3.87–5.11)
Retic Count, Absolute: 32.4 10*3/uL (ref 19.0–186.0)
Retic Ct Pct: 3.2 % — ABNORMAL HIGH (ref 0.4–3.1)

## 2019-11-04 LAB — PREPARE RBC (CROSSMATCH)

## 2019-11-04 LAB — FOLATE: Folate: 8.8 ng/mL (ref >5.9)

## 2019-11-04 LAB — PHOSPHORUS: Phosphorus: 3.8 mg/dL (ref 2.5–4.6)

## 2019-11-04 LAB — FERRITIN: Ferritin: 237 ng/mL (ref 11–307)

## 2019-11-04 LAB — CK: Total CK: 121 U/L (ref 38–234)

## 2019-11-04 LAB — SARS CORONAVIRUS 2 BY RT PCR (HOSPITAL ORDER, PERFORMED IN ~~LOC~~ HOSPITAL LAB): SARS Coronavirus 2: NEGATIVE

## 2019-11-04 LAB — VITAMIN B12: Vitamin B-12: <50 pg/mL — ABNORMAL LOW (ref 180–914)

## 2019-11-04 MED ORDER — CYANOCOBALAMIN 1000 MCG/ML IJ SOLN: 1000.0000 ug | Freq: Once | INTRAMUSCULAR | Status: DC

## 2019-11-04 MED ORDER — ADULT MULTIVITAMIN W/MINERALS CH
1.0000 | ORAL_TABLET | Freq: Every day | ORAL | Status: DC
  Administered 2019-11-05 – 2019-11-08 (×4): 1 via ORAL
  Filled 2019-11-04 (×4): qty 1

## 2019-11-04 MED ORDER — VITAMIN B-12 1000 MCG PO TABS
1000.0000 ug | ORAL_TABLET | Freq: Every day | ORAL | Status: DC
  Administered 2019-11-05: 1000 ug via ORAL
  Filled 2019-11-04: qty 1

## 2019-11-04 MED ORDER — SODIUM CHLORIDE 0.9 % IV BOLUS
500.0000 mL | Freq: Once | INTRAVENOUS | Status: AC
  Administered 2019-11-04: 500 mL via INTRAVENOUS

## 2019-11-04 MED ORDER — SODIUM CHLORIDE 0.9 % IV SOLN
10.0000 mL/h | Freq: Once | INTRAVENOUS | Status: AC
  Administered 2019-11-04: 10 mL/h via INTRAVENOUS

## 2019-11-04 MED ORDER — CALCIUM CARBONATE-VITAMIN D 600-400 MG-UNIT PO TABS: 1.0000 | ORAL_TABLET | Freq: Every day | ORAL | Status: DC

## 2019-11-04 MED ORDER — ACETAMINOPHEN 325 MG PO TABS
650.0000 mg | ORAL_TABLET | Freq: Once | ORAL | Status: AC
  Administered 2019-11-04: 650 mg via ORAL
  Filled 2019-11-04: qty 2

## 2019-11-04 MED ORDER — CALCIUM CARBONATE-VITAMIN D 500-200 MG-UNIT PO TABS
1.0000 | ORAL_TABLET | Freq: Every day | ORAL | Status: DC
  Administered 2019-11-05 – 2019-11-08 (×4): 1 via ORAL
  Filled 2019-11-04 (×4): qty 1

## 2019-11-04 MED ORDER — ACETAMINOPHEN 325 MG PO TABS
650.0000 mg | ORAL_TABLET | Freq: Four times a day (QID) | ORAL | Status: DC | PRN
  Administered 2019-11-06 – 2019-11-08 (×5): 650 mg via ORAL
  Filled 2019-11-04 (×6): qty 2

## 2019-11-04 NOTE — ED Triage Notes (Signed)
Multiple falls since last Friday. Lives at home by self. Walker to ambulate. Children frequently come by to check on patient. Pt sts generalized pain.

## 2019-11-04 NOTE — ED Provider Notes (Signed)
[facility] DEPT Provider Note   CSN: [id number] Arrival date & time: 11/04/19  1911     History Chief Complaint  Patient presents with  . Multiple Falls    Karen Mendez is a 86 y.o. female hx of HTN, HL, here presenting with recurrent falls.  Patient is from home and lives by herself.  Patient states that she has been falling over the last 3 to 4 days.  Patient walks with a walker and just feels weak and falls on the floor.  Family checks in on her today and she is complaining of some left rib pain so patient was brought in for evaluation.  Patient denies actual syncope.  Patient is not currently on blood thinners.  The history is provided by the patient.       Past Medical History:  Diagnosis Date  . Degeneration of cervical intervertebral disc   . Elevated fasting blood sugar   . Fibromyalgia   . Hyperlipidemia   . Hypertension   . Lumbar disc narrowing   . Macular degeneration   . Restless leg syndrome   . Thoracic spondylosis   . Uninodular goiter     Patient Active Problem List   Diagnosis Date Noted  . Stable angina ([place]) 01/25/2018  . Chest pain 09/26/2017  . Fibromyalgia   . Macular degeneration   . Essential hypertension   . Uninodular goiter   . Mixed hyperlipidemia   . Elevated fasting blood sugar   . Lumbar disc narrowing   . Restless leg syndrome   . Thoracic spondylosis   . Degeneration of cervical intervertebral disc     Past Surgical History:  Procedure Laterality Date  . CATARACT EXTRACTION Right   . CATARACT EXTRACTION Left   . TOTAL ABDOMINAL HYSTERECTOMY       OB History   No obstetric history on file.     Family History  Problem Relation Age of Onset  . Heart attack Mother   . Hypertension Mother   . Heart attack Father   . Congestive Heart Failure Sister   . Lung cancer Sister   . Melanoma Child     Social History   Tobacco Use  . Smoking status: Never Smoker  . Smokeless tobacco: Never  Used  Substance Use Topics  . Alcohol use: Never  . Drug use: Never    Home Medications Prior to Admission medications   Medication Sig Start Date End Date Taking? Authorizing Provider  amiloride-hydrochlorothiazide (MODURETIC) 5-50 MG tablet Take 1 tablet by mouth daily.    [provider]  aspirin EC 81 MG tablet Take 81 mg by mouth daily.    [provider]  Calcium Carbonate-Vitamin D (CALTRATE 600+D) 600-400 MG-UNIT tablet Take 1 tablet by mouth daily.    [provider]  carvedilol (COREG) 12.5 MG tablet Take 1 tablet (12.5 mg total) by mouth 2 (two) times daily. 07/30/19   Hilty, Nadean Corwin, MD  Multiple Vitamins-Minerals (PRESERVISION/LUTEIN PO) Take 1 capsule by mouth 2 (two) times daily.    [provider]    Allergies    Etodolac, Nefazodone, Penicillins, and Sulfamethoxazole  Review of Systems   Review of Systems  Neurological: Positive for weakness.  All other systems reviewed and are negative.   Physical Exam Updated Vital Signs BP (!) 102/40   Pulse 82   Temp 98.7 F (37.1 C) (Oral)   Resp 18   SpO2 100%   Physical Exam Vitals and nursing note  reviewed.  Constitutional:      Comments: Chronically ill, pale   HENT:     Head: Normocephalic.     Comments: No obvious scalp hematoma     Mouth/Throat:     Mouth: Mucous membranes are moist.  Eyes:     Extraocular Movements: Extraocular movements intact.     Pupils: Pupils are equal, round, and reactive to light.  Cardiovascular:     Rate and Rhythm: Normal rate and regular rhythm.     Pulses: Normal pulses.     Heart sounds: Normal heart sounds.  Pulmonary:     Effort: Pulmonary effort is normal.     Breath sounds: Normal breath sounds.     Comments: + L rib tenderness  Abdominal:     General: Abdomen is flat.     Palpations: Abdomen is soft.  Musculoskeletal:        General: Normal range of motion.     Cervical back: Normal range of motion.  Skin:    General: Skin  is warm.     Capillary Refill: Capillary refill takes less than 2 seconds.  Neurological:     General: No focal deficit present.     Mental Status: She is oriented to person, place, and time.  Psychiatric:        Mood and Affect: Mood normal.     ED Results / Procedures / Treatments   Labs (all labs ordered are listed, but only abnormal results are displayed) Labs Reviewed  CBC WITH DIFFERENTIAL/PLATELET - Abnormal; Notable for the following components:      Result Value   RBC 1.03 (*)    Hemoglobin 5.4 (*)    HCT 16.0 (*)    MCV 155.3 (*)    MCH 52.4 (*)    RDW 19.8 (*)    nRBC 0.5 (*)    All other components within normal limits  COMPREHENSIVE METABOLIC PANEL - Abnormal; Notable for the following components:   Glucose, Bld 126 (*)    BUN 41 (*)    Creatinine, Ser 1.39 (*)    Calcium 8.2 (*)    Total Protein 5.7 (*)    Alkaline Phosphatase 33 (*)    Total Bilirubin 2.4 (*)    GFR calc non Af Amer 32 (*)    GFR calc Af Amer 37 (*)    All other components within normal limits  CK  VITAMIN B12  FOLATE  IRON AND TIBC  FERRITIN  RETICULOCYTES  POC OCCULT BLOOD, ED  TYPE AND SCREEN  PREPARE RBC (CROSSMATCH)    EKG None  Radiology DG Ribs Unilateral W/Chest Left  Result Date: 11/04/2019 CLINICAL DATA:  86 year old female with multiple falls in the last 3 days. Left rib pain. EXAM: LEFT RIBS AND CHEST - 3+ VIEW COMPARISON:  None. FINDINGS: AP semi upright view of the chest. Lung volumes and mediastinal contours are within normal limits. Visualized tracheal air column is within normal limits. No pneumothorax, pulmonary edema, pleural effusion or confluent pulmonary opacity identified. Levoconvex thoracic and partially visible dextroconvex lumbar scoliosis. Bone mineralization is within normal limits for age. Rib marker is at the lower left rib costochondral junction. An acute costochondral fracture cannot be excluded, especially at the 8th/9th level. Furthermore, there is a  subtle, minimally displaced anterior left 4th rib fracture suspected (arrow). No other left rib fracture identified. Other visible osseous structures appear grossly intact. Paucity of bowel gas in the visible abdomen. IMPRESSION: 1. Minimally displaced anterior left 4th rib fracture  suspected. And cannot exclude acute costochondral fracture at the left 8th/9th level corresponding to the marked area of concern. 2. No superimposed acute cardiopulmonary abnormality. Electronically Signed   By: Odessa Fleming M.D.   On: 11/04/2019 20:09   DG Pelvis 1-2 Views  Result Date: 11/04/2019 CLINICAL DATA:  Status post multiple falls. EXAM: PELVIS - 1-2 VIEW COMPARISON:  None. FINDINGS: There is no evidence of pelvic fracture or diastasis. Mild degenerative changes seen involving both hips and the visualized portion of the lower lumbar spine. No pelvic bone lesions are seen. A 1.2 cm radiopaque foreign body is seen overlying the left iliac wing. There is mild vascular calcification. IMPRESSION: 1. No acute osseous abnormality. Electronically Signed   By: Aram Candela M.D.   On: 11/04/2019 20:08   CT Head Wo Contrast  Result Date: 11/04/2019 CLINICAL DATA:  Multiple falls since last week EXAM: CT HEAD WITHOUT CONTRAST TECHNIQUE: Contiguous axial images were obtained from the base of the skull through the vertex without intravenous contrast. COMPARISON:  None. FINDINGS: Brain: Hypodensities are seen throughout the periventricular white matter consistent with age-indeterminate small vessel ischemic changes, favor chronic. Otherwise no signs of acute infarct or hemorrhage. There is prominence of the lateral ventricles out of proportion to the degree of cerebral atrophy, which could reflect normal pressure hydrocephalus. Remaining midline structures are unremarkable. No acute extra-axial fluid collections. No mass effect. Vascular: No hyperdense vessel or unexpected calcification. Skull: Normal. Negative for fracture or focal  lesion. Sinuses/Orbits: No acute finding. Other: None. IMPRESSION: 1. Likely chronic small-vessel ischemic changes throughout the white matter. 2. Prominence of the lateral ventricles out of proportion to the degree of cerebral atrophy, which could reflect normal pressure hydrocephalus. 3. No acute infarct or hemorrhage. Electronically Signed   By: Sharlet Salina M.D.   On: 11/04/2019 20:22   CT Cervical Spine Wo Contrast  Result Date: 11/04/2019 CLINICAL DATA:  Multiple falls since last week EXAM: CT CERVICAL SPINE WITHOUT CONTRAST TECHNIQUE: Multidetector CT imaging of the cervical spine was performed without intravenous contrast. Multiplanar CT image reconstructions were also generated. COMPARISON:  None. FINDINGS: Alignment: There is mild anterolisthesis of C3 relative to C4, due to prominent facet hypertrophic changes. Slight straightening of the cervical spine due to multilevel spondylosis and facet hypertrophy. Skull base and vertebrae: No acute displaced cervical spine fracture. Soft tissues and spinal canal: No prevertebral fluid or swelling. No visible canal hematoma. Disc levels: There is extensive multilevel spondylosis most pronounced at C4-5, C5-6, and C6-7. There is mild symmetrical neural foraminal narrowing at these levels. Prominent facet hypertrophy at C3-4, along with a mild anterolisthesis described above, results in significant bilateral neural foraminal encroachment. Upper chest: Airway is patent.  Lung apices are clear. Other: Reconstructed images demonstrate no additional findings. IMPRESSION: 1. Extensive multilevel cervical spondylosis and facet hypertrophy. No acute fracture. Electronically Signed   By: Sharlet Salina M.D.   On: 11/04/2019 20:24    Procedures Procedures (including critical care time)  CRITICAL CARE Performed by: Richardean Canal   Total critical care time: 30 minutes  Critical care time was exclusive of separately billable procedures and treating other  patients.  Critical care was necessary to treat or prevent imminent or life-threatening deterioration.  Critical care was time spent personally by me on the following activities: development of treatment plan with patient and/or surrogate as well as nursing, discussions with consultants, evaluation of patient's response to treatment, examination of patient, obtaining history from patient or surrogate, ordering and  performing treatments and interventions, ordering and review of laboratory studies, ordering and review of radiographic studies, pulse oximetry and re-evaluation of patient's condition.   Medications Ordered in ED Medications  acetaminophen (TYLENOL) tablet 650 mg (has no administration in time range)  0.9 %  sodium chloride infusion (has no administration in time range)  sodium chloride 0.9 % bolus 500 mL (500 mLs Intravenous New Bag/Given 11/04/19 1950)    ED Course  I have reviewed the triage vital signs and the nursing notes.  Pertinent labs & imaging results that were available during my care of the patient were reviewed by me and considered in my medical decision making (see chart for details).    MDM Rules/Calculators/A&P                      DARCY BARBARA is a 86 y.o. female here with recurrent falls.  Patient does appear pale so I did consider symptomatic anemia. Also she has not been eating and drinking much and has been laying on the floor we will also consider rhabdomyolysis.  Will get CBC, CMP, CK level, CT head and CT cervical spine and chest x-ray.  8:52 PM Her hemoglobin is 5.4.  Patient's guaiac is negative and patient denies any black stools or blood in her stool. Patient has no hematuria or vomiting any blood.  I think likely anemia chronic disease.  We will add on anemia panel.  Will transfuse 2 units PRBC.  Of note, patient does have 1 rib fracture but is not hypoxic and has no oxygen requirement.  Hospitalist to admit for symptomatic anemia.  Final Clinical  Impression(s) / ED Diagnoses Final diagnoses:  None    Rx / DC Orders ED Discharge Orders    None       Charlynne Pander, MD 11/04/19 2054

## 2019-11-04 NOTE — H&P (Addendum)
[facility] History and Physical  Karen Mendez ZJQ:[id number] DOB: 1923/02/08 DOA: 11/04/2019  Referring EDP: Darl Householder PCP: Nicoletta Dress, MD   Chief Complaint: Fall  HPI: Karen Mendez is a 86 y.o. female with PMH of HTN, HLD, fibromyalgia and degenerative disc disease presents to ER after a fall and admitted for further workup; found to have Hgb 5.4.  History provided by patient and her daughter. Patient lives independently and able to perform all daily tasks. Daughter reports a few falls over the years but none for a long time. Patient fell on Friday and then last night. Last night's fall described as a slip on the hardwood floor. Patient unsure if she felt dizzy prior but denies LOC or hitting head. She laid on the floor all night until her daughter fell this morning. Overall not in much pain, some pain over left side which has been mostly relieved with Tylenol. Daughter reports noting more paleness and weakness over the last 6 weeks. Compared to last year at this time, patient eats a lot less that she did previously. Patient denies hematochezia, melena. Reports an episode of diarrhea several days ago but none in the last few days. Denies vomiting. Denies vaginal bleeding. Reports normal colonoscopies years ago. Only reports dizziness when standing up and with walking around, none at rest. Denies chest pain, SOB, fever, chills, cough, dysuria, confusion. She has chronic back pain.   In the ED: Vitals stable on room air. Labs remarkable for Hgb 5.4, RBC 1.03, Hct 16.0, MCV 155.3, Plt 167, Cr 1.39, Protein 5.7, CK WNL. Hemoccult negative.   Imaging of pelvis, left chest/ribs and CT Spine/Head remarkable for only acute non-displaced left 4th rib fracture.   EDP ordered 2 u PRBCs and called for admission for symptomatic anemia and further workup.  Review of Systems:  All other systems negative unless noted above in HPI.   Past Medical History:  Diagnosis Date  . Chest pain 09/26/2017   . Degeneration of cervical intervertebral disc   . Elevated fasting blood sugar   . Fibromyalgia   . Hyperlipidemia   . Hypertension   . Lumbar disc narrowing   . Macular degeneration   . Restless leg syndrome   . Stable angina ([place]) 01/25/2018  . Thoracic spondylosis   . Uninodular goiter    Past Surgical History:  Procedure Laterality Date  . CATARACT EXTRACTION Right   . CATARACT EXTRACTION Left   . TOTAL ABDOMINAL HYSTERECTOMY     Social History:  reports that she has never smoked. She has never used smokeless tobacco. She reports that she does not drink alcohol or use drugs.  Allergies  Allergen Reactions  . Etodolac Other (See Comments)  . Nefazodone Other (See Comments)  . Penicillins Rash  . Sulfamethoxazole Rash    Family History  Problem Relation Age of Onset  . Heart attack Mother   . Hypertension Mother   . Heart attack Father   . Congestive Heart Failure Sister   . Lung cancer Sister   . Melanoma Child     Prior to Admission medications   Medication Sig Start Date End Date Taking? Authorizing Provider  amiloride-hydrochlorothiazide (MODURETIC) 5-50 MG tablet Take 1 tablet by mouth daily.    [provider]  aspirin EC 81 MG tablet Take 81 mg by mouth daily.    [provider]  Calcium Carbonate-Vitamin D (CALTRATE 600+D) 600-400 MG-UNIT tablet Take 1 tablet by mouth daily.    [provider]  carvedilol (COREG) 12.5 MG tablet Take 1 tablet (12.5 mg total) by mouth 2 (two) times daily. 07/30/19   Hilty, Lisette Abu, MD  Multiple Vitamins-Minerals (PRESERVISION/LUTEIN PO) Take 1 capsule by mouth 2 (two) times daily.    [provider]   Physical Exam: Vitals:   11/04/19 1919 11/04/19 1924 11/04/19 2120  BP:  (!) 102/40 (!) 123/39  Pulse:  82 74  Resp:  18 17  Temp:  98.7 F (37.1 C)   TempSrc:  Oral   SpO2: 95% 100% 100%    Wt Readings from Last 3 Encounters:  11/01/19 56.7 kg  09/02/19 56.7 kg  01/24/18 60.4 kg      . General:  Appears calm and comfortable. AAOx4. Appears pale and frail.  . Eyes: EOMI, mild scleral icterus  . ENT: grossly normal hearing, lips & tongue . Neck: normal ROM . Cardiovascular: RRR, no m/r/g. No LE edema. Marland Kitchen Respiratory: CTA bilaterally, no w/r/r. Normal respiratory effort. . Abdomen: soft, ntnd . Skin: no rash or induration seen on limited exam; small scattered ecchymosis on arms presumed secondary to thin skin; no hematomas or large areas of bruising noted from fall  . Musculoskeletal: grossly normal tone BUE/BLE . Psychiatric: grossly normal mood and affect, speech fluent and appropriate . Neurologic: grossly non-focal.          Labs on Admission:  Basic Metabolic Panel: Recent Labs  Lab 11/04/19 1941  NA 138  K 4.3  CL 106  CO2 23  GLUCOSE 126*  BUN 41*  CREATININE 1.39*  CALCIUM 8.2*   Liver Function Tests: Recent Labs  Lab 11/04/19 1941  AST 18  ALT 13  ALKPHOS 33*  BILITOT 2.4*  PROT 5.7*  ALBUMIN 3.7   No results for input(s): LIPASE, AMYLASE in the last 168 hours. No results for input(s): AMMONIA in the last 168 hours. CBC: Recent Labs  Lab 11/04/19 1941  WBC 6.6  NEUTROABS 5.3  HGB 5.4*  HCT 16.0*  MCV 155.3*  PLT 167   Cardiac Enzymes: Recent Labs  Lab 11/04/19 1941  CKTOTAL 121    BNP (last 3 results) No results for input(s): BNP in the last 8760 hours.  ProBNP (last 3 results) No results for input(s): PROBNP in the last 8760 hours.  CBG: No results for input(s): GLUCAP in the last 168 hours.  Radiological Exams on Admission: DG Ribs Unilateral W/Chest Left  Result Date: 11/04/2019 CLINICAL DATA:  86 year old female with multiple falls in the last 3 days. Left rib pain. EXAM: LEFT RIBS AND CHEST - 3+ VIEW COMPARISON:  None. FINDINGS: AP semi upright view of the chest. Lung volumes and mediastinal contours are within normal limits. Visualized tracheal air column is within normal limits. No pneumothorax, pulmonary  edema, pleural effusion or confluent pulmonary opacity identified. Levoconvex thoracic and partially visible dextroconvex lumbar scoliosis. Bone mineralization is within normal limits for age. Rib marker is at the lower left rib costochondral junction. An acute costochondral fracture cannot be excluded, especially at the 8th/9th level. Furthermore, there is a subtle, minimally displaced anterior left 4th rib fracture suspected (arrow). No other left rib fracture identified. Other visible osseous structures appear grossly intact. Paucity of bowel gas in the visible abdomen. IMPRESSION: 1. Minimally displaced anterior left 4th rib fracture suspected. And cannot exclude acute costochondral fracture at the left 8th/9th level corresponding to the marked area of concern. 2. No superimposed acute cardiopulmonary abnormality. Electronically Signed   By: Odessa Fleming M.D.   On:  11/04/2019 20:09   DG Pelvis 1-2 Views  Result Date: 11/04/2019 CLINICAL DATA:  Status post multiple falls. EXAM: PELVIS - 1-2 VIEW COMPARISON:  None. FINDINGS: There is no evidence of pelvic fracture or diastasis. Mild degenerative changes seen involving both hips and the visualized portion of the lower lumbar spine. No pelvic bone lesions are seen. A 1.2 cm radiopaque foreign body is seen overlying the left iliac wing. There is mild vascular calcification. IMPRESSION: 1. No acute osseous abnormality. Electronically Signed   By: Aram Candela M.D.   On: 11/04/2019 20:08   CT Head Wo Contrast  Result Date: 11/04/2019 CLINICAL DATA:  Multiple falls since last week EXAM: CT HEAD WITHOUT CONTRAST TECHNIQUE: Contiguous axial images were obtained from the base of the skull through the vertex without intravenous contrast. COMPARISON:  None. FINDINGS: Brain: Hypodensities are seen throughout the periventricular white matter consistent with age-indeterminate small vessel ischemic changes, favor chronic. Otherwise no signs of acute infarct or hemorrhage.  There is prominence of the lateral ventricles out of proportion to the degree of cerebral atrophy, which could reflect normal pressure hydrocephalus. Remaining midline structures are unremarkable. No acute extra-axial fluid collections. No mass effect. Vascular: No hyperdense vessel or unexpected calcification. Skull: Normal. Negative for fracture or focal lesion. Sinuses/Orbits: No acute finding. Other: None. IMPRESSION: 1. Likely chronic small-vessel ischemic changes throughout the white matter. 2. Prominence of the lateral ventricles out of proportion to the degree of cerebral atrophy, which could reflect normal pressure hydrocephalus. 3. No acute infarct or hemorrhage. Electronically Signed   By: Sharlet Salina M.D.   On: 11/04/2019 20:22   CT Cervical Spine Wo Contrast  Result Date: 11/04/2019 CLINICAL DATA:  Multiple falls since last week EXAM: CT CERVICAL SPINE WITHOUT CONTRAST TECHNIQUE: Multidetector CT imaging of the cervical spine was performed without intravenous contrast. Multiplanar CT image reconstructions were also generated. COMPARISON:  None. FINDINGS: Alignment: There is mild anterolisthesis of C3 relative to C4, due to prominent facet hypertrophic changes. Slight straightening of the cervical spine due to multilevel spondylosis and facet hypertrophy. Skull base and vertebrae: No acute displaced cervical spine fracture. Soft tissues and spinal canal: No prevertebral fluid or swelling. No visible canal hematoma. Disc levels: There is extensive multilevel spondylosis most pronounced at C4-5, C5-6, and C6-7. There is mild symmetrical neural foraminal narrowing at these levels. Prominent facet hypertrophy at C3-4, along with a mild anterolisthesis described above, results in significant bilateral neural foraminal encroachment. Upper chest: Airway is patent.  Lung apices are clear. Other: Reconstructed images demonstrate no additional findings. IMPRESSION: 1. Extensive multilevel cervical  spondylosis and facet hypertrophy. No acute fracture. Electronically Signed   By: Sharlet Salina M.D.   On: 11/04/2019 20:24    EKG: None on admission; will obtain  Assessment/Plan Principal Problem:   Fall Active Problems:   Fibromyalgia   Essential hypertension   Lumbar disc narrowing   Degeneration of cervical intervertebral disc   Macrocytic anemia   Left rib fracture  86 y.o. female with PMH of HTN, HLD, fibromyalgia and degenerative disc disease presents to ER after a fall and admitted for further workup; found to have Hgb 5.4.  Fall Left Rib Fracture  - Presumed secondary to chronic macrocytic anemia that has slowly been worsening over time, possibly exacerbated by poor diet over last year - Rule out BP medication related, syncope, GI bleed  - Hemoccult negative and no hx of hematochezia, hematemesis, melena; patient reports she would likely decline colonoscopy regardless; GI not  consulted as more likely causes for anemia present  - Iron studies, folate and B12 pending  - Not syncopal by history but will also obtain Echo and keep on Tele for 24 hours  - PT and OT consults - Nutrition consults  - Tylenol for pain; declines opioids  - Will hold DVT ppx for now due to acute anemia and low likelihood of GI bleed; would need Heparin if added due to CrCl - Mag and Phos ordered on admission   Macrocytic Anemia - Hgb 5.4, RBC 1.03, Hct 16.0, MCV 155.3 - Iron studies, Folate and B12 pending - Patient amenable to blood transfusion, 2 units ordered with repeat CBC in AM - Nutrition consult, suspect B12 or folate deficiency   Likely CKD - no labs present for comparison - If CMP baseline for patient, has CKD Stage 3b which could also contribute to anemia with etiology of chronic disease - trend kidney function  HTN - As above, hold home Coreg and Amiloride-HCTZ as BP's low normal for age and DBPs running 39-40 - Monitor BP's, goal for age loosely <  140/90  Fibromyalgia Degenerative Disc Disease - Tylenol PRN as patient takes at home  Code Status: DNR/DNI. Declines chest compressions and intubation. Okay with NIV, pressors, ICU-level care.  DVT Prophylaxis: SCDs; unlikely to have acute bleed by history; likely chronic anemia, but will hold for now-would need Heparin if added due to CrCl Family Communication: Daughter, Efraim Kaufmann, at bedside Disposition Plan: Admit to inpatient. Patient requiring blood products and further workup. PT/OT consulted. Patient is at high risk for further decompensation due to age and co-morbidities.    Time spent: 70 minutes  Joselyn Arrow, MD [facility] Pager [phone]

## 2019-11-05 ENCOUNTER — Inpatient Hospital Stay (HOSPITAL_COMMUNITY): Payer: Medicare HMO

## 2019-11-05 DIAGNOSIS — S2232XD Fracture of one rib, left side, subsequent encounter for fracture with routine healing: Secondary | ICD-10-CM

## 2019-11-05 DIAGNOSIS — I10 Essential (primary) hypertension: Secondary | ICD-10-CM

## 2019-11-05 DIAGNOSIS — I340 Nonrheumatic mitral (valve) insufficiency: Secondary | ICD-10-CM

## 2019-11-05 DIAGNOSIS — E440 Moderate protein-calorie malnutrition: Secondary | ICD-10-CM | POA: Insufficient documentation

## 2019-11-05 DIAGNOSIS — D539 Nutritional anemia, unspecified: Secondary | ICD-10-CM

## 2019-11-05 DIAGNOSIS — W19XXXD Unspecified fall, subsequent encounter: Secondary | ICD-10-CM

## 2019-11-05 DIAGNOSIS — M5030 Other cervical disc degeneration, unspecified cervical region: Secondary | ICD-10-CM

## 2019-11-05 LAB — COMPREHENSIVE METABOLIC PANEL
ALT: 11 U/L (ref 0–44)
AST: 15 U/L (ref 15–41)
Albumin: 3.5 g/dL (ref 3.5–5.0)
Alkaline Phosphatase: 33 U/L — ABNORMAL LOW (ref 38–126)
Anion gap: 9 (ref 5–15)
BUN: 39 mg/dL — ABNORMAL HIGH (ref 8–23)
CO2: 23 mmol/L (ref 22–32)
Calcium: 8 mg/dL — ABNORMAL LOW (ref 8.9–10.3)
Chloride: 109 mmol/L (ref 98–111)
Creatinine, Ser: 1.45 mg/dL — ABNORMAL HIGH (ref 0.44–1.00)
GFR calc Af Amer: 35 mL/min — ABNORMAL LOW (ref >60)
GFR calc non Af Amer: 31 mL/min — ABNORMAL LOW (ref >60)
Glucose, Bld: 100 mg/dL — ABNORMAL HIGH (ref 70–99)
Potassium: 4.3 mmol/L (ref 3.5–5.1)
Sodium: 141 mmol/L (ref 135–145)
Total Bilirubin: 3.7 mg/dL — ABNORMAL HIGH (ref 0.3–1.2)
Total Protein: 5.5 g/dL — ABNORMAL LOW (ref 6.5–8.1)

## 2019-11-05 LAB — CBC
HCT: 27.7 % — ABNORMAL LOW (ref 36.0–46.0)
HCT: 28.3 % — ABNORMAL LOW (ref 36.0–46.0)
Hemoglobin: 9.6 g/dL — ABNORMAL LOW (ref 12.0–15.0)
Hemoglobin: 9.8 g/dL — ABNORMAL LOW (ref 12.0–15.0)
MCH: 36.6 pg — ABNORMAL HIGH (ref 26.0–34.0)
MCH: 36.6 pg — ABNORMAL HIGH (ref 26.0–34.0)
MCHC: 34.7 g/dL (ref 30.0–36.0)
MCHC: 34.6 g/dL (ref 30.0–36.0)
MCV: 105.7 fL — ABNORMAL HIGH (ref 80.0–100.0)
MCV: 105.6 fL — ABNORMAL HIGH (ref 80.0–100.0)
Platelets: 153 10*3/uL (ref 150–400)
Platelets: 163 10*3/uL (ref 150–400)
RBC: 2.62 MIL/uL — ABNORMAL LOW (ref 3.87–5.11)
RBC: 2.68 MIL/uL — ABNORMAL LOW (ref 3.87–5.11)
WBC: 7.2 10*3/uL (ref 4.0–10.5)
WBC: 8.8 10*3/uL (ref 4.0–10.5)
nRBC: 0 % (ref 0.0–0.2)
nRBC: 0 % (ref 0.0–0.2)

## 2019-11-05 LAB — ECHOCARDIOGRAM COMPLETE
Height: 62 in
Weight: 1499.13 oz

## 2019-11-05 LAB — ABO/RH: ABO/RH(D): A POS

## 2019-11-05 MED ORDER — ENSURE ENLIVE PO LIQD
237.0000 mL | Freq: Two times a day (BID) | ORAL | Status: DC
  Administered 2019-11-05 – 2019-11-07 (×4): 237 mL via ORAL

## 2019-11-05 NOTE — Evaluation (Signed)
Physical Therapy Evaluation Patient Details Name: Karen Mendez MRN: [id number] DOB: 1923/04/30 Today's Date: 11/05/2019   History of Present Illness  86 y.o. female with PMH of HTN, HLD, fibromyalgia and degenerative disc disease presents to ER after a fall and admitted for further worku.  Found to have Hgb 5.4-s/p transfusion, L 4th rib fracture.  Clinical Impression  On eval, pt required Min assist for mobility. She was only able to take a few ambulatory steps in the room with use of a RW. Pt presents with general weakness, decreased activity tolerance, and impaired gait and balance. Pt c/o dizziness with attempted ambulation. High fall risk with near fall. Poor safety awareness at times. Pt reports she lives at home alone. At this time she does not appear able to safely manage at home alone. Recommendation is for ST SNF.     Follow Up Recommendations SNF    Equipment Recommendations  None recommended by PT    Recommendations for Other Services       Precautions / Restrictions Precautions Precautions: Fall Restrictions Weight Bearing Restrictions: No      Mobility  Bed Mobility Overal bed mobility: Needs Assistance Bed Mobility: Sit to Supine       Sit to supine: Min guard   General bed mobility comments: increased time and effort. cues required  Transfers Overall transfer level: Needs assistance Equipment used: Rolling walker (2 wheeled) Transfers: Sit to/from Stand Sit to Stand: Min assist         General transfer comment: Assist to power up, stabilize, control descent. Poor safety awareness with pt attempting to sit before safely positioned. Cues for safety, hand placement.  Ambulation/Gait Ambulation/Gait assistance: Min assist Gait Distance (Feet): 4 Feet Assistive device: Rolling walker (2 wheeled) Gait Pattern/deviations: Step-through pattern;Decreased stride length;Trunk flexed     General Gait Details: Pt only able to tolerate walking 4 feet forwards  then backwards. She c/o fatigue, weakness, and dizziness-unable to safely attempt any further ambulation. High fall risk.  Stairs            Wheelchair Mobility    Modified Rankin (Stroke Patients Only)       Balance Overall balance assessment: Needs assistance Sitting-balance support: Bilateral upper extremity supported;Feet supported Sitting balance-Leahy Scale: Fair     Standing balance support: Bilateral upper extremity supported Standing balance-Leahy Scale: Poor Standing balance comment: high fall risk                             Pertinent Vitals/Pain Pain Assessment: Faces Faces Pain Scale: Hurts little more Pain Location: buttock, lower back Pain Descriptors / Indicators: Aching;Grimacing;Discomfort Pain Intervention(s): Repositioned;Monitored during session    Home Living Family/patient expects to be discharged to:: Private residence Living Arrangements: Alone Available Help at Discharge: Family;Available PRN/intermittently Type of Home: House       Home Layout: Two level;Able to live on main level with bedroom/bathroom Home Equipment: Walker - 4 wheels;Cane - single point;Shower seat;Grab bars - toilet;Grab bars - tub/shower      Prior Function Level of Independence: Needs assistance   Gait / Transfers Assistance Needed: reports mod I with cane or rollator, uses rollator more often  ADL's / Homemaking Assistance Needed: patient reports mod I with basic self care, has granddaughter that assists with groceries, laundry, cleaning        Hand Dominance   Dominant Hand: Right    Extremity/Trunk Assessment   Upper Extremity Assessment Upper Extremity  Assessment: Overall WFL for tasks assessed    Lower Extremity Assessment Lower Extremity Assessment: Generalized weakness    Cervical / Trunk Assessment Cervical / Trunk Assessment: Kyphotic  Communication   Communication: No difficulties  Cognition Arousal/Alertness:  Awake/alert Behavior During Therapy: WFL for tasks assessed/performed Overall Cognitive Status: No family/caregiver present to determine baseline cognitive functioning                                 General Comments: slow to respond at times. appears mildly confused.      General Comments General comments (skin integrity, edema, etc.): patient reporting dizziness BP in chair 127/59, unable to obtain reading in standing     Exercises     Assessment/Plan    PT Assessment Patient needs continued PT services  PT Problem List Decreased strength;Decreased mobility;Decreased balance;Decreased activity tolerance;Decreased knowledge of use of DME       PT Treatment Interventions DME instruction;Gait training;Therapeutic activities;Therapeutic exercise;Patient/family education;Balance training;Functional mobility training    PT Goals (Current goals can be found in the Care Plan section)  Acute Rehab PT Goals Patient Stated Goal: "i hope i won't be here long. i just want to go home." PT Goal Formulation: With patient Time For Goal Achievement: 11/19/19 Potential to Achieve Goals: Good    Frequency Min 3X/week   Barriers to discharge Decreased caregiver support      Co-evaluation               AM-PAC PT "6 Clicks" Mobility  Outcome Measure Help needed turning from your back to your side while in a flat bed without using bedrails?: A Little Help needed moving from lying on your back to sitting on the side of a flat bed without using bedrails?: A Little Help needed moving to and from a bed to a chair (including a wheelchair)?: A Little Help needed standing up from a chair using your arms (e.g., wheelchair or bedside chair)?: A Little Help needed to walk in hospital room?: A Lot Help needed climbing 3-5 steps with a railing? : A Lot 6 Click Score: 16    End of Session Equipment Utilized During Treatment: Gait belt Activity Tolerance: Patient limited by  fatigue Patient left: in bed;with call bell/phone within reach;with bed alarm set   PT Visit Diagnosis: Muscle weakness (generalized) (M62.81);Difficulty in walking, not elsewhere classified (R26.2);History of falling (Z91.81)    Time: 8182-9937 PT Time Calculation (min) (ACUTE ONLY): 13 min   Charges:   PT Evaluation $PT Eval Low Complexity: 1 Low            Keziah Drotar P, PT Acute Rehabilitation

## 2019-11-05 NOTE — Evaluation (Signed)
Occupational Therapy Evaluation Patient Details Name: Karen Mendez MRN: [id number] DOB: 28-Sep-1922 Today's Date: 11/05/2019    History of Present Illness 86 y.o. female with PMH of HTN, HLD, fibromyalgia and degenerative disc disease presents to ER after a fall and admitted for further workup; found to have Hgb 5.4. Pt s/p transfusion hem 9.6. Patient with L 4th rib fracture.   Clinical Impression   Patient with functional deficits listed below impacting safety and independence with self care. Patient in recliner upon arrival, reporting "my rear end is sore." Patient require multiple attempts to scoot forward in chair, reporting dizziness. BP taken 127/59. Patient min A to stand with cues for body mechanics, pt with kyphotic posture and decreased standing tolerance unable to obtain standing BP. Pt repositioned in chair for comfort. Will continue to follow.    Follow Up Recommendations  SNF    Equipment Recommendations  Other (comment)(TBD)       Precautions / Restrictions Precautions Precautions: Fall Restrictions Weight Bearing Restrictions: No      Mobility Bed Mobility               General bed mobility comments: pt in recliner upon arrival  Transfers Overall transfer level: Needs assistance Equipment used: Rolling walker (2 wheeled) Transfers: Sit to/from Stand Sit to Stand: Min assist         General transfer comment: min A to power up to standing, decreased strength and standing tolerance. sits quickly back into chair with poor safety awareness.     Balance Overall balance assessment: Needs assistance Sitting-balance support: Bilateral upper extremity supported;Feet supported Sitting balance-Leahy Scale: Fair     Standing balance support: Bilateral upper extremity supported;During functional activity Standing balance-Leahy Scale: Poor Standing balance comment: reliant on external support                           ADL either performed or  assessed with clinical judgement   ADL Overall ADL's : Needs assistance/impaired     Grooming: Set up;Sitting   Upper Body Bathing: Set up;Sitting   Lower Body Bathing: Moderate assistance;Sitting/lateral leans;Sit to/from stand Lower Body Bathing Details (indicate cue type and reason): poor activity tolerance Upper Body Dressing : Set up;Sitting   Lower Body Dressing: Moderate assistance;Sit to/from stand;Sitting/lateral leans   Toilet Transfer: Minimal assistance;RW;Cueing for safety;Cueing for sequencing Toilet Transfer Details (indicate cue type and reason): simulated with functional mobility, poor standing tolerance and kyphotic posture over walker Toileting- Clothing Manipulation and Hygiene: Moderate assistance;Sit to/from stand;Sitting/lateral lean       Functional mobility during ADLs: Minimal assistance;Rolling walker;Cueing for sequencing;Cueing for safety General ADL Comments: patient requiring increased assistance with self care due to decreased strength, activity tolerance, safety, balance                  Pertinent Vitals/Pain Pain Assessment: Faces Faces Pain Scale: Hurts little more Pain Location: buttock, lower back Pain Descriptors / Indicators: Aching;Grimacing;Discomfort Pain Intervention(s): Repositioned;Monitored during session     Hand Dominance Right   Extremity/Trunk Assessment Upper Extremity Assessment Upper Extremity Assessment: Generalized weakness   Lower Extremity Assessment Lower Extremity Assessment: Defer to PT evaluation       Communication Communication Communication: No difficulties   Cognition Arousal/Alertness: Awake/alert Behavior During Therapy: WFL for tasks assessed/performed Overall Cognitive Status: No family/caregiver present to determine baseline cognitive functioning  General Comments: patient oriented to self, place, situation. Knows the year but stated its Feburary.  When asked to recall month again ~5 mins later patient able to remember "I said Aggie Cosier but you told me its June."    General Comments  patient reporting dizziness BP in chair 127/59, unable to obtain reading in standing             Home Living Family/patient expects to be discharged to:: Private residence Living Arrangements: Alone Available Help at Discharge: Family;Available PRN/intermittently Type of Home: House       Home Layout: Two level;Able to live on main level with bedroom/bathroom     Bathroom Shower/Tub: Producer, television/film/video: Standard     Home Equipment: Environmental consultant - 4 wheels;Cane - single point;Shower seat;Grab bars - toilet;Grab bars - tub/shower          Prior Functioning/Environment Level of Independence: Needs assistance  Gait / Transfers Assistance Needed: reports mod I with cane or rollator, uses rollator more often ADL's / Homemaking Assistance Needed: patient reports mod I with basic self care, has granddaughter that assists with groceries, laundry, cleaning            OT Problem List: Decreased strength;Decreased activity tolerance;Impaired balance (sitting and/or standing);Decreased cognition;Decreased safety awareness;Decreased knowledge of use of DME or AE;Pain      OT Treatment/Interventions: Self-care/ADL training;Therapeutic exercise;Energy conservation;DME and/or AE instruction;Therapeutic activities;Cognitive remediation/compensation;Patient/family education;Balance training    OT Goals(Current goals can be found in the care plan section) Acute Rehab OT Goals Patient Stated Goal: "I feel weak" OT Goal Formulation: With patient Time For Goal Achievement: 11/19/19 Potential to Achieve Goals: Good  OT Frequency: Min 2X/week   Barriers to D/C: Decreased caregiver support  patient reports her DTR/GrandDTR work, unable stay 24/7          AM-PAC OT "6 Clicks" Daily Activity     Outcome Measure Help from another person eating  meals?: None Help from another person taking care of personal grooming?: A Little Help from another person toileting, which includes using toliet, bedpan, or urinal?: A Lot Help from another person bathing (including washing, rinsing, drying)?: A Lot Help from another person to put on and taking off regular upper body clothing?: A Little Help from another person to put on and taking off regular lower body clothing?: A Lot 6 Click Score: 16   End of Session Equipment Utilized During Treatment: Rolling walker  Activity Tolerance: Patient limited by fatigue Patient left: in chair;with call bell/phone within reach;with chair alarm set  OT Visit Diagnosis: Other abnormalities of gait and mobility (R26.89);Muscle weakness (generalized) (M62.81)                Time: 5364-6803 OT Time Calculation (min): 20 min Charges:  OT General Charges $OT Visit: 1 Visit OT Evaluation $OT Eval Moderate Complexity: 1 Mod  Marlyce Huge OT Pager: [phone]  Carmelia Roller 11/05/2019, 12:27 PM

## 2019-11-05 NOTE — Progress Notes (Signed)
Initial Nutrition Assessment  DOCUMENTATION CODES:   Non-severe (moderate) malnutrition in context of chronic illness, Underweight  INTERVENTION:  - continue Ensure Enlive BID, each supplement provides 350 kcal and 20 grams of protein. - will order Magic Cup BID with meals, each supplement provides 290 kcal and 9 grams of protein. - will order 1 tablet multivitamin with minerals/day.  NUTRITION DIAGNOSIS:   Moderate Malnutrition related to chronic illness as evidenced by mild muscle depletion, moderate fat depletion, moderate muscle depletion.  GOAL:   Patient will meet greater than or equal to 90% of their needs  MONITOR:   PO intake, Supplement acceptance, Labs, Weight trends  REASON FOR ASSESSMENT:   Malnutrition Screening Tool, Consult Assessment of nutrition requirement/status  ASSESSMENT:   86 y.o. female with medical history of HTN, HLD, fibromyalgia, and degenerative disc disease. She presented to the ED after a fall at home. In the ED, she was found to have Hgb of 5.4.  No intakes documented since admission. Patient laying in bed and daughter is on the phone at bedside. Limited discussion at this time. Patient does not have any overt chewing difficulties, no swallowing difficulties. She lives alone and prepares items for herself or receives items from family. She is able to self-feed without difficulty. Her appetite is significantly decreased over the past year and she is eating ~50% of the amount that she was a year ago.  Per chart review, weight yesterday was 94 lb and weight on 4/5 was documented as 125 lb. This would indicates 31 lb weight loss (24.8% body weight) in the past 2 months.  Per notes: - L rib fx - macrocytic anemia - s/p fall at home   Labs reviewed; vitamin B12: <50 pg/ml, BUN: 39 mg/dl, creatinine: 3.47 mg/dl, GFR: 31 ml/min. Medications reviewed; 1 tablet oscal-D/day, 1000 mcg oral cyanocobalamin/day.     NUTRITION - FOCUSED PHYSICAL  EXAM:    Most Recent Value  Orbital Region  Mild depletion  Upper Arm Region  Moderate depletion  Thoracic and Lumbar Region  Unable to assess  Buccal Region  Moderate depletion  Temple Region  Moderate depletion  Clavicle Bone Region  Moderate depletion  Clavicle and Acromion Bone Region  Moderate depletion  Scapular Bone Region  Unable to assess  Dorsal Hand  Moderate depletion  Patellar Region  Mild depletion  Anterior Thigh Region  Unable to assess  Posterior Calf Region  Mild depletion  Edema (RD Assessment)  None  Hair  Reviewed  Eyes  Reviewed  Mouth  Unable to assess  Skin  Reviewed  Nails  Reviewed       Diet Order:   Diet Order            Diet regular Room service appropriate? Yes; Fluid consistency: Thin  Diet effective now              EDUCATION NEEDS:   No education needs have been identified at this time  Skin:  Skin Assessment: Reviewed RN Assessment  Last BM:  PTA/unknown  Height:   Ht Readings from Last 1 Encounters:  11/04/19 5\' 2"  (1.575 m)    Weight:   Wt Readings from Last 1 Encounters:  11/04/19 42.5 kg    Estimated Nutritional Needs:  Kcal:  1300-1500 kcal Protein:  65-75 grams Fluid:  >/= 1.6 L/day     01/04/20, MS, RD, LDN, CNSC Inpatient Clinical Dietitian RD pager # available in [facility]  After hours/weekend pager # available in [facility]

## 2019-11-05 NOTE — Progress Notes (Addendum)
PROGRESS NOTE    Karen Mendez  OYD:[id number] DOB: [birth year] DOA: 11/04/2019 PCP: Nicoletta Dress, MD    Chief Complaint  Patient presents with  . Multiple Falls    Brief Narrative:  Karen Mendez is a 86 y.o. female with PMH of HTN, HLD, fibromyalgia and degenerative disc disease presents to ER after a fall and admitted for further workup; found to have Hgb 5.4.  History was provided by patient and her daughter. Patient lives independently and able to perform all daily tasks. Daughter reports a few falls over the years but none for a long time. Patient fell on Friday and then night before presentation. Described as a slip on the hardwood floor. Patient unsure if she felt dizzy prior but denies LOC or hitting head. She laid on the floor all night until her daughter found her in the morning. Has some pain over left side which has been mostly relieved with Tylenol. Daughter reports noting more paleness and weakness over the last 6 weeks. Compared to last year at this time, patient eats a lot less that she did previously. Patient denies hematochezia, melena. Reports an episode of diarrhea several days ago but none in the last few days. Denies vomiting. Denies vaginal bleeding. Reports normal colonoscopies years ago. Only reports dizziness when standing up and with walking around, none at rest. Denies chest pain, SOB, fever, chills, cough, dysuria, confusion. She has chronic back pain.   In the ED: Vitals stable on room air. Labs remarkable for Hgb 5.4, RBC 1.03, Hct 16.0, MCV 155.3, Plt 167, Cr 1.39, Protein 5.7, CK WNL. Hemoccult negative.   Imaging of pelvis, left chest/ribs and CT Spine/Head remarkable for only acute non-displaced left 4th rib fracture.   EDP ordered 2 u PRBCs and called for admission for symptomatic anemia.   Assessment & Plan:   Principal Problem:   Fall Active Problems:   Fibromyalgia   Essential hypertension   Lumbar disc narrowing   Degeneration of  cervical intervertebral disc   Macrocytic anemia   Left rib fracture   Malnutrition of moderate degree  Fall Left Rib Fracture  - Presumed secondary to weakness from the chronic macrocytic anemia that has slowly been worsening over time, possibly exacerbated by poor diet over last year - Rule out BP medication related, syncope, GI bleed  - Hemoccult negative and no hx of hematochezia, hematemesis, melena; patient reports she would likely decline colonoscopy regardless; GI not consulted as more likely causes for anemia present  - Iron studies, folate and B12 pending  -Orthostatic vitals negative.  Ordered Echo, monitor on Tele for now  - PT and OT consulted - Nutrition consult  - Tylenol for pain; declines opioids  - Will hold DVT ppx for now due to acute anemia and low likelihood of GI bleed; would need Heparin if added due to CrCl - Mag and Phos normal   Macrocytic Anemia - Hgb 5.4, RBC 1.03, Hct 16.0, MCV 155.3 - Iron studies, Folate and B12 pending - S/p 2 units PRBC, repeat posttransfusion labs. - Nutrition consult, suspect B12 or folate deficiency?  Likely CKD - no labs present for comparison - If CMP baseline for patient, has CKD Stage 3b which could also contribute to anemia with etiology of chronic disease - trend kidney function  HTN - As above, hold home Coreg and Amiloride-HCTZ as BP's low normal for age and DBPs running 39-50 Monitor BP's, goal for age loosely < 140/90  Fibromyalgia Degenerative Disc Disease -  Tylenol PRN as patient takes at home  Code Status: DNR/DNI. Declines chest compressions and intubation. Okay with NIV, pressors, ICU-level care.  DVT Prophylaxis: SCDs; unlikely to have acute bleed by history; likely chronic anemia, but will hold for now-would need Heparin if added due to CrCl Family Communication:  Discussed with daughter Efraim KaufmannMelissa over the phone. Disposition Plan:  Unknown at this time.  Patient requiring blood products and further  workup. PT/OT consulted. Patient is at high risk for further decompensation due to age and co-morbidities.  Disposition:   Status is: Inpatient  Remains inpatient appropriate because:Severe anemia, gait instability, fall   Dispo: The patient is from: Home              Anticipated d/c is to: Unknown at this time              Anticipated d/c date is: 2 days              Patient currently is not medically stable to d/c.   Consultants:   None   Procedures:   Echo ordered   Antimicrobials:   None    Subjective: She is complaining of dizziness and some rib pain.  Objective: Vitals:   11/05/19 1032 11/05/19 1034 11/05/19 1039 11/05/19 1325  BP: (!) 117/51 128/61 (!) 143/66 (!) 139/48  Pulse: 89 80 95 79  Resp:    18  Temp: 98.3 F (36.8 C)   98.3 F (36.8 C)  TempSrc: Oral   Oral  SpO2: 100%   100%  Weight:      Height:        Intake/Output Summary (Last 24 hours) at 11/05/2019 1709 Last data filed at 11/05/2019 0730 Gross per 24 hour  Intake 669 ml  Output --  Net 669 ml   Filed Weights   11/04/19 2254  Weight: 42.5 kg    Examination:  General exam: [place], complaining of dizziness Respiratory system: Decreased breath sound lower lobes otherwise clear to auscultation  Cardiovascular system: S1 & S2, no murmur. No pedal edema. Gastrointestinal system: Abdomen is nondistended, soft and nontender. No masses felt. Normal bowel sounds heard. Central nervous system: She has debility with generalized weakness.  Gait instability. Extremities: Upper extremity grip strength okay.  No lower extremity edema.  Has age related muscle wasting of the lower extremity thigh area. Skin: No rashes Psychiatry: Mood & affect appropriate.     Data Reviewed: I have personally reviewed following labs and imaging studies  CBC: Recent Labs  Lab 11/04/19 1941 11/05/19 0959 11/05/19 1337  WBC 6.6 7.2 8.8  NEUTROABS 5.3  --   --   HGB 5.4* 9.6* 9.8*  HCT  16.0* 27.7* 28.3*  MCV 155.3* 105.7* 105.6*  PLT 167 153 163    Basic Metabolic Panel: Recent Labs  Lab 11/04/19 1941 11/05/19 0959  NA 138 141  K 4.3 4.3  CL 106 109  CO2 23 23  GLUCOSE 126* 100*  BUN 41* 39*  CREATININE 1.39* 1.45*  CALCIUM 8.2* 8.0*  MG 2.3  --   PHOS 3.8  --     GFR: Estimated Creatinine Clearance: 15.6 mL/min (A) (by C-G formula based on SCr of 1.45 mg/dL (H)).  Liver Function Tests: Recent Labs  Lab 11/04/19 1941 11/05/19 0959  AST 18 15  ALT 13 11  ALKPHOS 33* 33*  BILITOT 2.4* 3.7*  PROT 5.7* 5.5*  ALBUMIN 3.7 3.5    CBG: No results for input(s): GLUCAP in the last  168 hours.   Recent Results (from the past 240 hour(s))  SARS Coronavirus 2 by RT PCR (hospital order, performed in [facility] hospital lab) Nasopharyngeal Nasopharyngeal Swab     Status: None   Collection Time: 11/04/19  8:53 PM   Specimen: Nasopharyngeal Swab  Result Value Ref Range Status   SARS Coronavirus 2 NEGATIVE NEGATIVE Final    Comment: (NOTE) SARS-CoV-2 target nucleic acids are NOT DETECTED. The SARS-CoV-2 RNA is generally detectable in upper and lower respiratory specimens during the acute phase of infection. The lowest concentration of SARS-CoV-2 viral copies this assay can detect is 250 copies / mL. A negative result does not preclude SARS-CoV-2 infection and should not be used as the sole basis for treatment or other patient management decisions.  A negative result may occur with improper specimen collection / handling, submission of specimen other than nasopharyngeal swab, presence of viral mutation(s) within the areas targeted by this assay, and inadequate number of viral copies (<250 copies / mL). A negative result must be combined with clinical observations, patient history, and epidemiological information. Fact Sheet for Patients:   BoilerBrush.com.cy Fact Sheet for Healthcare  Providers: https://pope.com/ This test is not yet approved or cleared  by the [place] FDA and has been authorized for detection and/or diagnosis of SARS-CoV-2 by FDA under an Emergency Use Authorization (EUA).  This EUA will remain in effect (meaning this test can be used) for the duration of the COVID-19 declaration under Section 564(b)(1) of the Act, 21 U.S.C. section 360bbb-3(b)(1), unless the authorization is terminated or revoked sooner. Performed at [facility], [address] [place], [place] [address]          Radiology Studies: DG Ribs Unilateral W/Chest Left  Result Date: 11/04/2019 CLINICAL DATA:  86 year old female with multiple falls in the last 3 days. Left rib pain. EXAM: LEFT RIBS AND CHEST - 3+ VIEW COMPARISON:  None. FINDINGS: AP semi upright view of the chest. Lung volumes and mediastinal contours are within normal limits. Visualized tracheal air column is within normal limits. No pneumothorax, pulmonary edema, pleural effusion or confluent pulmonary opacity identified. Levoconvex thoracic and partially visible dextroconvex lumbar scoliosis. Bone mineralization is within normal limits for age. Rib marker is at the lower left rib costochondral junction. An acute costochondral fracture cannot be excluded, especially at the 8th/9th level. Furthermore, there is a subtle, minimally displaced anterior left 4th rib fracture suspected (arrow). No other left rib fracture identified. Other visible osseous structures appear grossly intact. Paucity of bowel gas in the visible abdomen. IMPRESSION: 1. Minimally displaced anterior left 4th rib fracture suspected. And cannot exclude acute costochondral fracture at the left 8th/9th level corresponding to the marked area of concern. 2. No superimposed acute cardiopulmonary abnormality. Electronically Signed   By: Odessa Fleming M.D.   On: 11/04/2019 20:09   DG Pelvis 1-2 Views  Result Date:  11/04/2019 CLINICAL DATA:  Status post multiple falls. EXAM: PELVIS - 1-2 VIEW COMPARISON:  None. FINDINGS: There is no evidence of pelvic fracture or diastasis. Mild degenerative changes seen involving both hips and the visualized portion of the lower lumbar spine. No pelvic bone lesions are seen. A 1.2 cm radiopaque foreign body is seen overlying the left iliac wing. There is mild vascular calcification. IMPRESSION: 1. No acute osseous abnormality. Electronically Signed   By: Aram Candela M.D.   On: 11/04/2019 20:08   CT Head Wo Contrast  Result Date: 11/04/2019 CLINICAL DATA:  Multiple falls since last  week EXAM: CT HEAD WITHOUT CONTRAST TECHNIQUE: Contiguous axial images were obtained from the base of the skull through the vertex without intravenous contrast. COMPARISON:  None. FINDINGS: Brain: Hypodensities are seen throughout the periventricular white matter consistent with age-indeterminate small vessel ischemic changes, favor chronic. Otherwise no signs of acute infarct or hemorrhage. There is prominence of the lateral ventricles out of proportion to the degree of cerebral atrophy, which could reflect normal pressure hydrocephalus. Remaining midline structures are unremarkable. No acute extra-axial fluid collections. No mass effect. Vascular: No hyperdense vessel or unexpected calcification. Skull: Normal. Negative for fracture or focal lesion. Sinuses/Orbits: No acute finding. Other: None. IMPRESSION: 1. Likely chronic small-vessel ischemic changes throughout the white matter. 2. Prominence of the lateral ventricles out of proportion to the degree of cerebral atrophy, which could reflect normal pressure hydrocephalus. 3. No acute infarct or hemorrhage. Electronically Signed   By: Sharlet Salina M.D.   On: 11/04/2019 20:22   CT Cervical Spine Wo Contrast  Result Date: 11/04/2019 CLINICAL DATA:  Multiple falls since last week EXAM: CT CERVICAL SPINE WITHOUT CONTRAST TECHNIQUE: Multidetector CT  imaging of the cervical spine was performed without intravenous contrast. Multiplanar CT image reconstructions were also generated. COMPARISON:  None. FINDINGS: Alignment: There is mild anterolisthesis of C3 relative to C4, due to prominent facet hypertrophic changes. Slight straightening of the cervical spine due to multilevel spondylosis and facet hypertrophy. Skull base and vertebrae: No acute displaced cervical spine fracture. Soft tissues and spinal canal: No prevertebral fluid or swelling. No visible canal hematoma. Disc levels: There is extensive multilevel spondylosis most pronounced at C4-5, C5-6, and C6-7. There is mild symmetrical neural foraminal narrowing at these levels. Prominent facet hypertrophy at C3-4, along with a mild anterolisthesis described above, results in significant bilateral neural foraminal encroachment. Upper chest: Airway is patent.  Lung apices are clear. Other: Reconstructed images demonstrate no additional findings. IMPRESSION: 1. Extensive multilevel cervical spondylosis and facet hypertrophy. No acute fracture. Electronically Signed   By: Sharlet Salina M.D.   On: 11/04/2019 20:24   ECHOCARDIOGRAM COMPLETE  Result Date: 11/05/2019    ECHOCARDIOGRAM REPORT   Patient Name:   LORRIANN HANSMANN Date of Exam: 11/05/2019 Medical Rec #:  [id number]       Height:       62.0 in Accession #:    [id number]      Weight:       93.7 lb Date of Birth:  08-Dec-1922        BSA:          1.385 m Patient Age:    89 years        BP:           123/53 mmHg Patient Gender: F               HR:           76 bpm. Exam Location:  Inpatient Procedure: 2D Echo Indications:    Syncope R55  History:        Patient has no prior history of Echocardiogram examinations.                 Risk Factors:Hypertension and Dyslipidemia.  Sonographer:    Thurman Coyer RDCS (AE) Referring Phys: [id number] CHELSEA N FAIR IMPRESSIONS  1. Left ventricular ejection fraction, by estimation, is 55 to 60%. The left ventricle has  normal function. The left ventricle has no regional wall motion abnormalities. Left ventricular diastolic parameters are consistent with Grade  II diastolic dysfunction (pseudonormalization). Elevated left atrial pressure.  2. Right ventricular systolic function is normal. The right ventricular size is normal. There is moderately elevated pulmonary artery systolic pressure. The estimated right ventricular systolic pressure is 46.6 mmHg.  3. Left atrial size was mildly dilated.  4. The mitral valve is abnormal. Moderate mitral annular calcification. Mild to moderate mitral valve regurgitation.  5. The aortic valve was not well visualized. Aortic valve regurgitation is not visualized. No aortic stenosis is present.  6. The inferior vena cava is dilated in size with <50% respiratory variability, suggesting right atrial pressure of 15 mmHg. FINDINGS  Left Ventricle: Left ventricular ejection fraction, by estimation, is 55 to 60%. The left ventricle has normal function. The left ventricle has no regional wall motion abnormalities. The left ventricular internal cavity size was normal in size. There is  no left ventricular hypertrophy. Left ventricular diastolic parameters are consistent with Grade II diastolic dysfunction (pseudonormalization). Elevated left atrial pressure. Right Ventricle: The right ventricular size is normal. Right vetricular wall thickness was not assessed. Right ventricular systolic function is normal. There is moderately elevated pulmonary artery systolic pressure. The tricuspid regurgitant velocity is  2.81 m/s, and with an assumed right atrial pressure of 15 mmHg, the estimated right ventricular systolic pressure is 46.6 mmHg. Left Atrium: Left atrial size was mildly dilated. Right Atrium: Right atrial size was normal in size. Pericardium: There is no evidence of pericardial effusion. Mitral Valve: The mitral valve is abnormal. Moderate mitral annular calcification. Mild to moderate mitral valve  regurgitation. Tricuspid Valve: The tricuspid valve is normal in structure. Tricuspid valve regurgitation is trivial. Aortic Valve: The aortic valve was not well visualized. Aortic valve regurgitation is not visualized. No aortic stenosis is present. Pulmonic Valve: The pulmonic valve was not well visualized. Pulmonic valve regurgitation is not visualized. Aorta: The aortic root is normal in size and structure. Venous: The inferior vena cava is dilated in size with less than 50% respiratory variability, suggesting right atrial pressure of 15 mmHg. IAS/Shunts: The interatrial septum was not well visualized.  LEFT VENTRICLE PLAX 2D LVIDd:         4.80 cm  Diastology LVIDs:         3.70 cm  LV e' lateral:   10.10 cm/s LV PW:         0.80 cm  LV E/e' lateral: 9.2 LV IVS:        0.90 cm  LV e' medial:    5.33 cm/s LVOT diam:     1.70 cm  LV E/e' medial:  17.4 LV SV:         48 LV SV Index:   35 LVOT Area:     2.27 cm  RIGHT VENTRICLE RV S prime:     9.57 cm/s TAPSE (M-mode): 1.3 cm LEFT ATRIUM             Index       RIGHT ATRIUM           Index LA diam:        3.90 cm 2.82 cm/m  RA Area:     10.70 cm LA Vol (A2C):   53.6 ml 38.70 ml/m RA Volume:   19.90 ml  14.37 ml/m LA Vol (A4C):   50.3 ml 36.32 ml/m LA Biplane Vol: 53.6 ml 38.70 ml/m  AORTIC VALVE LVOT Vmax:   95.10 cm/s LVOT Vmean:  62.600 cm/s LVOT VTI:    0.213 m  AORTA Ao Root diam:  3.10 cm MITRAL VALVE                TRICUSPID VALVE MV Area (PHT): 3.89 cm     TR Peak grad:   31.6 mmHg MV Decel Time: 195 msec     TR Vmax:        281.00 cm/s MR Peak grad: 135.0 mmHg MR Mean grad: 83.0 mmHg     SHUNTS MR Vmax:      581.00 cm/s   Systemic VTI:  0.21 m MR Vmean:     422.0 cm/s    Systemic Diam: 1.70 cm MV E velocity: 93.00 cm/s MV A velocity: 110.00 cm/s MV E/A ratio:  0.85 Epifanio Lesches MD Electronically signed by Epifanio Lesches MD Signature Date/Time: 11/05/2019/11:16:40 AM    Final         Scheduled Meds: . calcium-vitamin D  1 tablet  Oral Q breakfast  . feeding supplement (ENSURE ENLIVE)  237 mL Oral BID BM  . multivitamin with minerals  1 tablet Oral Daily  . vitamin B-12  1,000 mcg Oral Daily   Continuous Infusions:   LOS: 1 day    Vonzella Nipple, MD [facility]   To contact the attending provider between 7A-7P or the covering provider during after hours 7P-7A, please log into the web site [email or web address] and access using universal [place] password for that web site. If you do not have the password, please call the hospital operator.  11/05/2019, 5:09 PM

## 2019-11-05 NOTE — Progress Notes (Signed)
  Echocardiogram 2D Echocardiogram has been performed.  Karen Mendez 11/05/2019, 10:09 AM

## 2019-11-05 NOTE — Progress Notes (Signed)
PT Cancellation Note  Patient Details Name: Karen Mendez MRN: [id number] DOB: 1922-06-05   Cancelled Treatment:    Reason Eval/Treat Not Completed: Medical issues which prohibited therapy--hgb 5.4. Will hold PT for now and check back another time.   Faye Ramsay, PT Acute Rehabilitation

## 2019-11-06 LAB — BPAM RBC
Blood Product Expiration Date: 202106202359
Blood Product Expiration Date: 202106232359
ISSUE DATE / TIME: 202106080005
ISSUE DATE / TIME: 202106080426
Unit Type and Rh: 6200
Unit Type and Rh: 6200

## 2019-11-06 LAB — COMPREHENSIVE METABOLIC PANEL
ALT: 11 U/L (ref 0–44)
AST: 13 U/L — ABNORMAL LOW (ref 15–41)
Albumin: 3.2 g/dL — ABNORMAL LOW (ref 3.5–5.0)
Alkaline Phosphatase: 31 U/L — ABNORMAL LOW (ref 38–126)
Anion gap: 9 (ref 5–15)
BUN: 33 mg/dL — ABNORMAL HIGH (ref 8–23)
CO2: 23 mmol/L (ref 22–32)
Calcium: 8.2 mg/dL — ABNORMAL LOW (ref 8.9–10.3)
Chloride: 106 mmol/L (ref 98–111)
Creatinine, Ser: 1.15 mg/dL — ABNORMAL HIGH (ref 0.44–1.00)
GFR calc Af Amer: 47 mL/min — ABNORMAL LOW (ref >60)
GFR calc non Af Amer: 40 mL/min — ABNORMAL LOW (ref >60)
Glucose, Bld: 97 mg/dL (ref 70–99)
Potassium: 4.1 mmol/L (ref 3.5–5.1)
Sodium: 138 mmol/L (ref 135–145)
Total Bilirubin: 2.1 mg/dL — ABNORMAL HIGH (ref 0.3–1.2)
Total Protein: 5 g/dL — ABNORMAL LOW (ref 6.5–8.1)

## 2019-11-06 LAB — CBC
HCT: 28 % — ABNORMAL LOW (ref 36.0–46.0)
Hemoglobin: 9.6 g/dL — ABNORMAL LOW (ref 12.0–15.0)
MCH: 36.1 pg — ABNORMAL HIGH (ref 26.0–34.0)
MCHC: 34.3 g/dL (ref 30.0–36.0)
MCV: 105.3 fL — ABNORMAL HIGH (ref 80.0–100.0)
Platelets: 147 10*3/uL — ABNORMAL LOW (ref 150–400)
RBC: 2.66 MIL/uL — ABNORMAL LOW (ref 3.87–5.11)
WBC: 6.3 10*3/uL (ref 4.0–10.5)
nRBC: 0 % (ref 0.0–0.2)

## 2019-11-06 LAB — TYPE AND SCREEN
ABO/RH(D): A POS
Antibody Screen: NEGATIVE
Unit division: 0
Unit division: 0

## 2019-11-06 MED ORDER — CYANOCOBALAMIN 1000 MCG/ML IJ SOLN
1000.0000 ug | Freq: Every day | INTRAMUSCULAR | Status: DC
  Administered 2019-11-06 – 2019-11-08 (×3): 1000 ug via INTRAMUSCULAR
  Filled 2019-11-06 (×3): qty 1

## 2019-11-06 MED ORDER — FOLIC ACID 1 MG PO TABS
1.0000 mg | ORAL_TABLET | Freq: Every day | ORAL | Status: DC
  Administered 2019-11-06 – 2019-11-08 (×3): 1 mg via ORAL
  Filled 2019-11-06 (×3): qty 1

## 2019-11-06 NOTE — Progress Notes (Signed)
PROGRESS NOTE  Karen Mendez  ZOX:[id number] DOB: 10/26/1922 DOA: 11/04/2019 PCP: Paulina FusiSchultz, Douglas E, MD   Brief Narrative: Karen BravoHilda W Sheltonis a 86 y.o.femalewith PMH ofHTN, HLD, fibromyalgia and degenerative disc disease presents to ER after a fall and admitted for further workup; found to have Hgb 5.4.  History was provided by patient and her daughter. Patient lives independently and able to perform all daily tasks. Daughter reports a few falls over the years but none for a long time. Patient fell on Friday and then night before presentation. Described as a slip on the hardwood floor. Patient unsure if she felt dizzy prior but denies LOC or hitting head. She laid on the floor all night until her daughter found her in the morning. Has some pain over left side which has been mostly relieved with Tylenol. Daughter reports noting more paleness and weakness over the last 6 weeks. Compared to last year at this time, patient eats a lot less that she did previously. Patient denies hematochezia, melena. Reports an episode of diarrhea several days ago but none in the last few days. Denies vomiting. Denies vaginal bleeding. Reports normal colonoscopies years ago. Only reports dizziness when standing up and with walking around, none at rest. Denies chest pain, SOB, fever, chills, cough, dysuria, confusion. She has chronic back pain.  In the WJ:XBJYNWED:Vitals stable on room air. Labs remarkable forHgb 5.4, RBC 1.03, Hct 16.0, MCV 155.3, Plt 167, Cr 1.39, Protein 5.7, CK WNL. Hemoccult negative.   Imaging of pelvis, left chest/ribs and CT Spine/Head remarkable for only acute non-displaced left 4th rib fracture.   EDP ordered 2 u PRBCs and called for admission for symptomatic anemia.   Assessment & Plan: Principal Problem:   Fall Active Problems:   Fibromyalgia   Essential hypertension   Lumbar disc narrowing   Degeneration of cervical intervertebral disc   Macrocytic anemia   Left rib fracture    Malnutrition of moderate degree  Fall at home, deconditioning: No arrhythmias reported on telemetry, no orthostatic vital signs, no severe valvular abnormality on echo. Very likely has element of neuropathy from B12 deficiency.  - Needs rehabilitation at SNF   Moderate protein calorie malnutrition, but severe vitamin B12 deficiency:  - Start IM B12 supplementation (this was undetectable!) and continue daily while admitted, recommend continuing weekly in addition to po supplementation at SNF.   Left Rib Fracture: Asymptomatic  - IS  Megaloblastic anemia and anemia of chronic disease: MCV 155! with hgb 5.4 on arrival. s/p 2u PRBCs and hgb stable. No evidence of active bleeding.  - Monitor intermittently.    Stage IIIb CKD: CKD Stage 3b which could also contribute to anemia with etiology of chronic disease - trend kidney function  HTN - As above, hold home Coreg and Amiloride-HCTZ as BP's low normal for age and DBPs running 39-50 Monitor BP's, goal for age loosely < 140/90  Fibromyalgia Degenerative Disc Disease - Tylenol PRN as patient takes at home  Moderate protein calorie malnutrition:  - Supplement protein as able.   DVT prophylaxis: SCDs Code Status: DNR Family Communication: None at bedside.  Disposition Plan:  Status is: Inpatient  Remains inpatient appropriate because:Unsafe d/c plan. Will require placement, SNF search underway   Dispo: The patient is from: Home              Anticipated d/c is to: SNF              Anticipated d/c date is: 1 day  Patient currently is medically stable to d/c.  Consultants:   None  Procedures:   None  Antimicrobials:  None   Subjective: No new complaints. Denies orthopnea, dyspnea, chest pain, leg swelling. Not eating much.   Objective: Vitals:   11/05/19 1325 11/05/19 2047 11/06/19 0618 11/06/19 1440  BP: (!) 139/48 129/60 124/61 (!) 138/54  Pulse: 79 84 75 65  Resp: 18 18 16    Temp: 98.3 F (36.8 C)  (!) 97.5 F (36.4 C) 97.6 F (36.4 C) 98.2 F (36.8 C)  TempSrc: Oral   Oral  SpO2: 100% 99% 97% 98%  Weight:      Height:       No intake or output data in the 24 hours ending 11/06/19 2039 Filed Weights   11/04/19 2254  Weight: 42.5 kg    Gen: 86 y.o. female in no distress Pulm: Non-labored breathing. Clear to auscultation bilaterally.  CV: Regular rate and rhythm. No murmur, rub, or gallop. No JVD, no pedal edema. GI: Abdomen soft, non-tender, non-distended, with normoactive bowel sounds. No organomegaly or masses felt. Ext: Warm, no deformities Skin: No rashes, lesions  or ulcers Neuro: Alert and oriented. No focal neurological deficits. Psych: Judgement and insight appear normal. Mood & affect appropriate.   Data Reviewed: I have personally reviewed following labs and imaging studies  CBC: Recent Labs  Lab 11/04/19 1941 11/05/19 0959 11/05/19 1337 11/06/19 0438  WBC 6.6 7.2 8.8 6.3  NEUTROABS 5.3  --   --   --   HGB 5.4* 9.6* 9.8* 9.6*  HCT 16.0* 27.7* 28.3* 28.0*  MCV 155.3* 105.7* 105.6* 105.3*  PLT 167 153 163 485*   Basic Metabolic Panel: Recent Labs  Lab 11/04/19 1941 11/05/19 0959 11/06/19 0438  NA 138 141 138  K 4.3 4.3 4.1  CL 106 109 106  CO2 23 23 23   GLUCOSE 126* 100* 97  BUN 41* 39* 33*  CREATININE 1.39* 1.45* 1.15*  CALCIUM 8.2* 8.0* 8.2*  MG 2.3  --   --   PHOS 3.8  --   --    GFR: Estimated Creatinine Clearance: 19.6 mL/min (A) (by C-G formula based on SCr of 1.15 mg/dL (H)). Liver Function Tests: Recent Labs  Lab 11/04/19 1941 11/05/19 0959 11/06/19 0438  AST 18 15 13*  ALT 13 11 11   ALKPHOS 33* 33* 31*  BILITOT 2.4* 3.7* 2.1*  PROT 5.7* 5.5* 5.0*  ALBUMIN 3.7 3.5 3.2*   No results for input(s): LIPASE, AMYLASE in the last 168 hours. No results for input(s): AMMONIA in the last 168 hours. Coagulation Profile: No results for input(s): INR, PROTIME in the last 168 hours. Cardiac Enzymes: Recent Labs  Lab 11/04/19 1941    CKTOTAL 121   BNP (last 3 results) No results for input(s): PROBNP in the last 8760 hours. HbA1C: No results for input(s): HGBA1C in the last 72 hours. CBG: No results for input(s): GLUCAP in the last 168 hours. Lipid Profile: No results for input(s): CHOL, HDL, LDLCALC, TRIG, CHOLHDL, LDLDIRECT in the last 72 hours. Thyroid Function Tests: No results for input(s): TSH, T4TOTAL, FREET4, T3FREE, THYROIDAB in the last 72 hours. Anemia Panel: Recent Labs    11/04/19 1941 11/04/19 2106  VITAMINB12  --  <50*  FOLATE  --  8.8  FERRITIN  --  237  TIBC  --  245*  IRON  --  74  RETICCTPCT 3.2*  --    Urine analysis: No results found for: COLORURINE, APPEARANCEUR, LABSPEC, [place], GLUCOSEU,  HGBUR, BILIRUBINUR, KETONESUR, PROTEINUR, UROBILINOGEN, NITRITE, LEUKOCYTESUR Recent Results (from the past 240 hour(s))  SARS Coronavirus 2 by RT PCR (hospital order, performed in [facility] hospital lab) Nasopharyngeal Nasopharyngeal Swab     Status: None   Collection Time: 11/04/19  8:53 PM   Specimen: Nasopharyngeal Swab  Result Value Ref Range Status   SARS Coronavirus 2 NEGATIVE NEGATIVE Final    Comment: (NOTE) SARS-CoV-2 target nucleic acids are NOT DETECTED. The SARS-CoV-2 RNA is generally detectable in upper and lower respiratory specimens during the acute phase of infection. The lowest concentration of SARS-CoV-2 viral copies this assay can detect is 250 copies / mL. A negative result does not preclude SARS-CoV-2 infection and should not be used as the sole basis for treatment or other patient management decisions.  A negative result may occur with improper specimen collection / handling, submission of specimen other than nasopharyngeal swab, presence of viral mutation(s) within the areas targeted by this assay, and inadequate number of viral copies (<250 copies / mL). A negative result must be combined with clinical observations, patient history, and epidemiological  information. Fact Sheet for Patients:   BoilerBrush.com.cy Fact Sheet for Healthcare Providers: https://pope.com/ This test is not yet approved or cleared  by the [place] FDA and has been authorized for detection and/or diagnosis of SARS-CoV-2 by FDA under an Emergency Use Authorization (EUA).  This EUA will remain in effect (meaning this test can be used) for the duration of the COVID-19 declaration under Section 564(b)(1) of the Act, 21 U.S.C. section 360bbb-3(b)(1), unless the authorization is terminated or revoked sooner. Performed at [facility], [address] [place], [place] [address]       Radiology Studies: ECHOCARDIOGRAM COMPLETE  Result Date: 11/05/2019    ECHOCARDIOGRAM REPORT   Patient Name:   MISHKA STEGEMANN Date of Exam: 11/05/2019 Medical Rec #:  [id number]       Height:       62.0 in Accession #:    [id number]      Weight:       93.7 lb Date of Birth:  17-Jul-1922        BSA:          1.385 m Patient Age:    86 years        BP:           123/53 mmHg Patient Gender: F               HR:           76 bpm. Exam Location:  Inpatient Procedure: 2D Echo Indications:    Syncope R55  History:        Patient has no prior history of Echocardiogram examinations.                 Risk Factors:Hypertension and Dyslipidemia.  Sonographer:    Thurman Coyer RDCS (AE) Referring Phys: [id number] CHELSEA N FAIR IMPRESSIONS  1. Left ventricular ejection fraction, by estimation, is 55 to 60%. The left ventricle has normal function. The left ventricle has no regional wall motion abnormalities. Left ventricular diastolic parameters are consistent with Grade II diastolic dysfunction (pseudonormalization). Elevated left atrial pressure.  2. Right ventricular systolic function is normal. The right ventricular size is normal. There is moderately elevated pulmonary artery systolic pressure. The estimated right ventricular systolic pressure  is 46.6 mmHg.  3. Left atrial size was mildly dilated.  4. The mitral valve is abnormal. Moderate mitral annular calcification. Mild  to moderate mitral valve regurgitation.  5. The aortic valve was not well visualized. Aortic valve regurgitation is not visualized. No aortic stenosis is present.  6. The inferior vena cava is dilated in size with <50% respiratory variability, suggesting right atrial pressure of 15 mmHg. FINDINGS  Left Ventricle: Left ventricular ejection fraction, by estimation, is 55 to 60%. The left ventricle has normal function. The left ventricle has no regional wall motion abnormalities. The left ventricular internal cavity size was normal in size. There is  no left ventricular hypertrophy. Left ventricular diastolic parameters are consistent with Grade II diastolic dysfunction (pseudonormalization). Elevated left atrial pressure. Right Ventricle: The right ventricular size is normal. Right vetricular wall thickness was not assessed. Right ventricular systolic function is normal. There is moderately elevated pulmonary artery systolic pressure. The tricuspid regurgitant velocity is  2.81 m/s, and with an assumed right atrial pressure of 15 mmHg, the estimated right ventricular systolic pressure is 46.6 mmHg. Left Atrium: Left atrial size was mildly dilated. Right Atrium: Right atrial size was normal in size. Pericardium: There is no evidence of pericardial effusion. Mitral Valve: The mitral valve is abnormal. Moderate mitral annular calcification. Mild to moderate mitral valve regurgitation. Tricuspid Valve: The tricuspid valve is normal in structure. Tricuspid valve regurgitation is trivial. Aortic Valve: The aortic valve was not well visualized. Aortic valve regurgitation is not visualized. No aortic stenosis is present. Pulmonic Valve: The pulmonic valve was not well visualized. Pulmonic valve regurgitation is not visualized. Aorta: The aortic root is normal in size and structure. Venous: The  inferior vena cava is dilated in size with less than 50% respiratory variability, suggesting right atrial pressure of 15 mmHg. IAS/Shunts: The interatrial septum was not well visualized.  LEFT VENTRICLE PLAX 2D LVIDd:         4.80 cm  Diastology LVIDs:         3.70 cm  LV e' lateral:   10.10 cm/s LV PW:         0.80 cm  LV E/e' lateral: 9.2 LV IVS:        0.90 cm  LV e' medial:    5.33 cm/s LVOT diam:     1.70 cm  LV E/e' medial:  17.4 LV SV:         48 LV SV Index:   35 LVOT Area:     2.27 cm  RIGHT VENTRICLE RV S prime:     9.57 cm/s TAPSE (M-mode): 1.3 cm LEFT ATRIUM             Index       RIGHT ATRIUM           Index LA diam:        3.90 cm 2.82 cm/m  RA Area:     10.70 cm LA Vol (A2C):   53.6 ml 38.70 ml/m RA Volume:   19.90 ml  14.37 ml/m LA Vol (A4C):   50.3 ml 36.32 ml/m LA Biplane Vol: 53.6 ml 38.70 ml/m  AORTIC VALVE LVOT Vmax:   95.10 cm/s LVOT Vmean:  62.600 cm/s LVOT VTI:    0.213 m  AORTA Ao Root diam: 3.10 cm MITRAL VALVE                TRICUSPID VALVE MV Area (PHT): 3.89 cm     TR Peak grad:   31.6 mmHg MV Decel Time: 195 msec     TR Vmax:        281.00 cm/s MR Peak  grad: 135.0 mmHg MR Mean grad: 83.0 mmHg     SHUNTS MR Vmax:      581.00 cm/s   Systemic VTI:  0.21 m MR Vmean:     422.0 cm/s    Systemic Diam: 1.70 cm MV E velocity: 93.00 cm/s MV A velocity: 110.00 cm/s MV E/A ratio:  0.85 Epifanio Lesches MD Electronically signed by Epifanio Lesches MD Signature Date/Time: 11/05/2019/11:16:40 AM    Final     Scheduled Meds: . calcium-vitamin D  1 tablet Oral Q breakfast  . cyanocobalamin  1,000 mcg Intramuscular Daily  . feeding supplement (ENSURE ENLIVE)  237 mL Oral BID BM  . folic acid  1 mg Oral Daily  . multivitamin with minerals  1 tablet Oral Daily   Continuous Infusions:   LOS: 2 days   Time spent: 25 minutes.  Tyrone Nine, MD [facility] [email or web address] 11/06/2019, 8:39 PM

## 2019-11-06 NOTE — NC FL2 (Signed)
[place] LEVEL OF CARE SCREENING TOOL     IDENTIFICATION  Patient Name: Karen Mendez Birthdate: 1923/04/12 Sex: female Admission Date (Current Location): 11/04/2019  [facility] [place] Number:  Herbalist and Address:  [facility],  [address] [place], [place]      Provider Number: [id number]  Attending Physician Name and Address:  Patrecia Pour, MD  Relative Name and Phone Number:       Current Level of Care: Hospital Recommended Level of Care: [place] Prior Approval Number:    Date Approved/Denied:   PASRR Number: #[id number]  Discharge Plan: SNF    Current Diagnoses: Patient Active Problem List   Diagnosis Date Noted  . Malnutrition of moderate degree 11/05/2019  . Fall 11/04/2019  . Macrocytic anemia 11/04/2019  . Left rib fracture 11/04/2019  . Fibromyalgia   . Macular degeneration   . Essential hypertension   . Uninodular goiter   . Mixed hyperlipidemia   . Elevated fasting blood sugar   . Lumbar disc narrowing   . Restless leg syndrome   . Thoracic spondylosis   . Degeneration of cervical intervertebral disc     Orientation RESPIRATION BLADDER Height & Weight     Self, Time, Situation, Place  Normal External catheter, Incontinent Weight: 42.5 kg Height:  5\' 2"  (157.5 cm)  BEHAVIORAL SYMPTOMS/MOOD NEUROLOGICAL BOWEL NUTRITION STATUS      Continent Diet(Regular)  AMBULATORY STATUS COMMUNICATION OF NEEDS Skin   Extensive Assist Verbally Other (Comment)(Dry, Flaky, Ecchymosis bilateral arms and legs)                       Personal Care Assistance Level of Assistance  Bathing, Feeding, Dressing Bathing Assistance: Maximum assistance Feeding assistance: Independent Dressing Assistance: Maximum assistance     Functional Limitations Info  Sight, Hearing, Speech Sight Info: Adequate Hearing Info: Adequate Speech Info: Adequate    SPECIAL CARE FACTORS FREQUENCY  PT (By licensed  PT), OT (By licensed OT)     PT Frequency: PT 5x week OT Frequency: OT 5x week            Contractures Contractures Info: Not present    Additional Factors Info  Code Status, Allergies Code Status Info: DNR Allergies Info: Etodolac, Nefazodone, Penicillins, Sulfamethoxazole           Current Medications (11/06/2019):  This is the current hospital active medication list Current Facility-Administered Medications  Medication Dose Route Frequency Provider Last Rate Last Admin  . acetaminophen (TYLENOL) tablet 650 mg  650 mg Oral Q6H PRN Fair, Marin Shutter, MD      . calcium-vitamin D (OSCAL WITH D) 500-200 MG-UNIT per tablet 1 tablet  1 tablet Oral Q breakfast Fair, Marin Shutter, MD   1 tablet at 11/06/19 1036  . cyanocobalamin ((VITAMIN B-12)) injection 1,000 mcg  1,000 mcg Intramuscular Daily Patrecia Pour, MD   1,000 mcg at 11/06/19 1137  . feeding supplement (ENSURE ENLIVE) (ENSURE ENLIVE) liquid 237 mL  237 mL Oral BID BM Fair, Marin Shutter, MD   237 mL at 11/06/19 1448  . folic acid (FOLVITE) tablet 1 mg  1 mg Oral Daily Patrecia Pour, MD   1 mg at 11/06/19 1036  . multivitamin with minerals tablet 1 tablet  1 tablet Oral Daily Chauncey Mann, MD   1 tablet at 11/06/19 1036     Discharge Medications: Please see discharge summary for a list of discharge  medications.  Relevant Imaging Results:  Relevant Lab Results:   Additional Information SS#[id number]  Geni Bers, RN

## 2019-11-06 NOTE — TOC Progression Note (Signed)
Transition of Care [facility]) - Progression Note    Patient Details  Name: Karen Mendez MRN: [id number] Date of Birth: 1922/06/19  [facility]) CM/SW Contact  Geni Bers, RN Phone Number: 11/06/2019, 4:56 PM  Clinical Narrative:     Spoke with pt's daughter concerning discharge to SNF. Daughter agreed. FL2 faxed to SNF facilities.   Expected Discharge Plan: Skilled Nursing Facility Barriers to Discharge: No Barriers Identified  Expected Discharge Plan and Services Expected Discharge Plan: Skilled Nursing Facility   Discharge Planning Services: CM Consult   Living arrangements for the past 2 months: Single Family Home                                       Social Determinants of Health (SDOH) Interventions    Readmission Risk Interventions No flowsheet data found.

## 2019-11-07 DIAGNOSIS — M797 Fibromyalgia: Secondary | ICD-10-CM

## 2019-11-07 MED ORDER — CARVEDILOL 3.125 MG PO TABS: 3.1250 mg | ORAL_TABLET | Freq: Two times a day (BID) | ORAL | Status: AC

## 2019-11-07 MED ORDER — VITAMIN B-12 1000 MCG PO TABS: 1000.0000 ug | ORAL_TABLET | Freq: Every day | ORAL | Status: AC

## 2019-11-07 NOTE — TOC Progression Note (Signed)
Transition of Care [facility]) - Progression Note    Patient Details  Name: Karen Mendez MRN: [id number] Date of Birth: 03-24-1923  Transition of Care [facility]) CM/SW Contact  Karen Bers, Karen Mendez Phone Number: 11/07/2019, 12:15 PM  Clinical Narrative:    Daughter changed and asked for [facility] Admission Coordinator was called with referral.    Expected Discharge Plan: Skilled Nursing Facility Barriers to Discharge: No Barriers Identified  Expected Discharge Plan and Services Expected Discharge Plan: Skilled Nursing Facility   Discharge Planning Services: CM Consult   Living arrangements for the past 2 months: Single Family Home Expected Discharge Date: 11/07/19                                     Social Determinants of Health (SDOH) Interventions    Readmission Risk Interventions No flowsheet data found.

## 2019-11-07 NOTE — Care Management Important Message (Signed)
Important Message  Patient Details IM Letter given to Ezekiel Ina RN Case Manager to present to the Patient Name: Karen Mendez MRN: [id number] Date of Birth: [birth year]   Medicare Important Message Given:  Yes     Caren Macadam 11/07/2019, 12:49 PM

## 2019-11-07 NOTE — TOC Progression Note (Signed)
Transition of Care [facility]) - Progression Note    Patient Details  Name: Karen Mendez MRN: [id number] Date of Birth: 10/31/1922  Transition of Care [facility]) CM/SW Contact  Geni Bers, RN Phone Number: 11/07/2019, 11:32 AM  Clinical Narrative:    SNF bed offers given to pt's daughter who did not approve of any of the three. Daughter asked [facility]. FL2 [facility]ed to [facility]. No answer from [facility] called left VM. Also called [facility]/declined related to pt's insurance.    Expected Discharge Plan: Skilled Nursing Facility Barriers to Discharge: No Barriers Identified  Expected Discharge Plan and Services Expected Discharge Plan: Skilled Nursing Facility   Discharge Planning Services: CM Consult   Living arrangements for the past 2 months: Single Family Home Expected Discharge Date: 11/07/19                                     Social Determinants of Health (SDOH) Interventions    Readmission Risk Interventions No flowsheet data found.

## 2019-11-07 NOTE — TOC Progression Note (Signed)
Transition of Care [facility]) - Progression Note    Patient Details  Name: Karen Mendez MRN: [id number] Date of Birth: 1922/06/30  Transition of Care [facility]) CM/SW Contact  Geni Bers, RN Phone Number: 11/07/2019, 11:53 AM  Clinical Narrative:     [name] selected [facility]. SNF will need to call for Insurance authorization. Pt is not [employer].   Expected Discharge Plan: Skilled Nursing Facility Barriers to Discharge: No Barriers Identified  Expected Discharge Plan and Services Expected Discharge Plan: Skilled Nursing Facility   Discharge Planning Services: CM Consult   Living arrangements for the past 2 months: Single Family Home Expected Discharge Date: 11/07/19                                     Social Determinants of Health (SDOH) Interventions    Readmission Risk Interventions No flowsheet data found.

## 2019-11-07 NOTE — Progress Notes (Signed)
Physical Therapy Treatment Patient Details Name: Karen Mendez MRN: [id number] DOB: [birth year] Today's Date: 11/07/2019    History of Present Illness 86 y.o. female with PMH of HTN, HLD, fibromyalgia and degenerative disc disease presents to ER after a fall and admitted for further worku.  Found to have Hgb 5.4-s/p transfusion, L 4th rib fracture.    PT Comments    Pt limited due to fatigue, dizziness, and reports in general "not feeling like doing anything."  Pt's VSS during PT.  She required cues for transfer and min A for transfers.  Only able to ambulate 3' at EOB prior to wanting to sit back down.  Will benefit from PT to progress mobility.  Note that did see pt in the evening, possibly may have more energy to participate earlier in the day.    Follow Up Recommendations  SNF     Equipment Recommendations  None recommended by PT    Recommendations for Other Services       Precautions / Restrictions Precautions Precautions: Fall    Mobility  Bed Mobility Overal bed mobility: Needs Assistance Bed Mobility: Sit to Supine;Supine to Sit     Supine to sit: Min assist Sit to supine: Min assist   General bed mobility comments: increased time and effort. cues required for transfer technique for pain control  Transfers Overall transfer level: Needs assistance Equipment used: Rolling walker (2 wheeled) Transfers: Sit to/from Stand Sit to Stand: Min assist         General transfer comment: Cues for safe hand placement and use of RW once standing.  Assist to power up and control descent.  Ambulation/Gait Ambulation/Gait assistance: Min assist Gait Distance (Feet): 3 Feet Assistive device: Rolling walker (2 wheeled) Gait Pattern/deviations: Step-to pattern;Shuffle;Trunk flexed Gait velocity: decreased   General Gait Details: Pt only able to takes steps toward [facility].  Required assist for steadying and use of RW.  Pt c/o dizziness and unable to ambulate further   Stairs              Wheelchair Mobility    Modified Rankin (Stroke Patients Only)       Balance Overall balance assessment: Needs assistance Sitting-balance support: Bilateral upper extremity supported;Feet supported Sitting balance-Leahy Scale: Fair Sitting balance - Comments: Able to maintain with supervision but did lean forward with elbows on knees   Standing balance support: Bilateral upper extremity supported Standing balance-Leahy Scale: Poor Standing balance comment: min A for balance                            Cognition Arousal/Alertness: Awake/alert Behavior During Therapy: Flat affect Overall Cognitive Status: Within Functional Limits for tasks assessed                                 General Comments: Slow to respond at times and appears mildly confused.  Pt reports not feeling well and in general with minimal communication.      Exercises      General Comments General comments (skin integrity, edema, etc.): Pt had c/o dizziness.  She alerted PT once sitting but reports it is present most of the time.  BP in sitting was 127/77.  O2 and HR stable.   Pt declined further activity due to fatigue and pastor here to visit.      Pertinent Vitals/Pain Pain Assessment: No/denies pain    Home  Living                      Prior Function            PT Goals (current goals can now be found in the care plan section) Acute Rehab PT Goals Patient Stated Goal: "i hope i won't be here long. i just want to go home." PT Goal Formulation: With patient Time For Goal Achievement: 11/19/19 Potential to Achieve Goals: Good Progress towards PT goals: Progressing toward goals    Frequency    Min 3X/week      PT Plan Current plan remains appropriate    Co-evaluation              AM-PAC PT "6 Clicks" Mobility   Outcome Measure  Help needed turning from your back to your side while in a flat bed without using bedrails?: A  Little Help needed moving from lying on your back to sitting on the side of a flat bed without using bedrails?: A Little Help needed moving to and from a bed to a chair (including a wheelchair)?: A Little Help needed standing up from a chair using your arms (e.g., wheelchair or bedside chair)?: A Little Help needed to walk in hospital room?: A Lot Help needed climbing 3-5 steps with a railing? : A Lot 6 Click Score: 16    End of Session Equipment Utilized During Treatment: Gait belt Activity Tolerance: Patient limited by fatigue Patient left: in bed;with call bell/phone within reach;with bed alarm set;with family/visitor present Nurse Communication: Mobility status PT Visit Diagnosis: Muscle weakness (generalized) (M62.81);Difficulty in walking, not elsewhere classified (R26.2);History of falling (Z91.81)     Time: 1650-1710 PT Time Calculation (min) (ACUTE ONLY): 20 min  Charges:  $Therapeutic Activity: 8-22 mins                     Anise Salvo, PT Acute Rehab Services Pager [phone] [facility] [phone]     Rayetta Humphrey 11/07/2019, 5:18 PM

## 2019-11-07 NOTE — Discharge Summary (Signed)
Physician Discharge Summary  [name] RXV:[id number] DOB: Dec 25, 1922 DOA: 11/04/2019  PCP: [name], MD  Admit date: 11/04/2019 Discharge date: 11/07/2019  Admitted From: Home Disposition: SNF  Recommendations for Outpatient Follow-up:  1. Follow up with PCP in 1-2 weeks 2. Please obtain BMP/CBC in one week 3. Continue B12 supplementation and monitoring.  Home Health: N/A Equipment/Devices: Per SNF Discharge Condition: Stable CODE STATUS: DNR Diet recommendation: Heart healthy  Brief/Interim Summary: Karen Mendez is a 86 y.o. female with a history of HTN, HLD, fibromyalgia, DDD who presented after a fall at home found to have hemoglobin of 5.4g/dl. A left 4th rib fracture was noted without any other fractures/dislocations on imaging. Vitamin B12 was undetectable and was supplemented. 2u PRBCs were given with improvement in hgb which has remained stable since with no evidence of bleeding. PT/OT have recommended rehabilitation at SNF which is being pursued.  Discharge Diagnoses:  Principal Problem:   Fall Active Problems:   Fibromyalgia   Essential hypertension   Lumbar disc narrowing   Degeneration of cervical intervertebral disc   Macrocytic anemia   Left rib fracture   Malnutrition of moderate degree  Fall at home, deconditioning: No arrhythmias reported on telemetry, no orthostatic vital signs, no severe valvular abnormality on echo. Very likely has element of neuropathy from B12 deficiency.  - Needs rehabilitation at SNF   Moderate protein calorie malnutrition, but severe vitamin B12 deficiency:  - Started IM B12 supplementation (this was undetectable!) and continued daily while admitted, recommend continuing weekly in addition to po supplementation at SNF.   Left rib fracture: Asymptomatic, nondisplaced - IS  Megaloblastic anemia and anemia of chronic disease: MCV 155! with hgb 5.4 on arrival. s/p 2u PRBCs and hgb stable. No evidence of active  bleeding.  - Monitor at follow up   Stage IIIb CKD: Contributing to anemia - Trend kidney function  HTN - As above, hold home Coreg and Amiloride-HCTZ as BP's low normal for age and DBPs running 39-50Monitor BP's, goal for age loosely <140/90  Fibromyalgia Degenerative Disc Disease - Tylenol PRN as patient takes at home  Moderate protein calorie malnutrition:  - Supplement protein as able.   Discharge Instructions  Allergies as of 11/07/2019      Reactions   Etodolac Other (See Comments)   Nefazodone Other (See Comments)   Penicillins Rash   Sulfamethoxazole Rash      Medication List    STOP taking these medications   amiloride-hydrochlorothiazide 5-50 MG tablet Commonly known as: MODURETIC     TAKE these medications   aspirin EC 81 MG tablet Take 81 mg by mouth daily.   carvedilol 3.125 MG tablet Commonly known as: COREG Take 1 tablet (3.125 mg total) by mouth 2 (two) times daily. What changed:   medication strength  how much to take   PRESERVISION/LUTEIN PO Take 1 capsule by mouth 2 (two) times daily.   vitamin B-12 1000 MCG tablet Commonly known as: CYANOCOBALAMIN Take 1 tablet (1,000 mcg total) by mouth daily.       Follow-up Information    [name], MD Follow up.   Specialty: Internal Medicine Contact information: [address] [place] [address] [phone]              Allergies  Allergen Reactions  . Etodolac Other (See Comments)  . Nefazodone Other (See Comments)  . Penicillins Rash  . Sulfamethoxazole Rash    Consultations:  None  Procedures/Studies: DG Ribs  Unilateral W/Chest Left  Result Date: 11/04/2019 CLINICAL DATA:  86 year old female with multiple falls in the last 3 days. Left rib pain. EXAM: LEFT RIBS AND CHEST - 3+ VIEW COMPARISON:  None. FINDINGS: AP semi upright view of the chest. Lung volumes and mediastinal contours are within normal limits. Visualized tracheal air column is  within normal limits. No pneumothorax, pulmonary edema, pleural effusion or confluent pulmonary opacity identified. Levoconvex thoracic and partially visible dextroconvex lumbar scoliosis. Bone mineralization is within normal limits for age. Rib marker is at the lower left rib costochondral junction. An acute costochondral fracture cannot be excluded, especially at the 8th/9th level. Furthermore, there is a subtle, minimally displaced anterior left 4th rib fracture suspected (arrow). No other left rib fracture identified. Other visible osseous structures appear grossly intact. Paucity of bowel gas in the visible abdomen. IMPRESSION: 1. Minimally displaced anterior left 4th rib fracture suspected. And cannot exclude acute costochondral fracture at the left 8th/9th level corresponding to the marked area of concern. 2. No superimposed acute cardiopulmonary abnormality. Electronically Signed   By: Odessa Fleming M.D.   On: 11/04/2019 20:09   DG Pelvis 1-2 Views  Result Date: 11/04/2019 CLINICAL DATA:  Status post multiple falls. EXAM: PELVIS - 1-2 VIEW COMPARISON:  None. FINDINGS: There is no evidence of pelvic fracture or diastasis. Mild degenerative changes seen involving both hips and the visualized portion of the lower lumbar spine. No pelvic bone lesions are seen. A 1.2 cm radiopaque foreign body is seen overlying the left iliac wing. There is mild vascular calcification. IMPRESSION: 1. No acute osseous abnormality. Electronically Signed   By: Aram Candela M.D.   On: 11/04/2019 20:08   CT Head Wo Contrast  Result Date: 11/04/2019 CLINICAL DATA:  Multiple falls since last week EXAM: CT HEAD WITHOUT CONTRAST TECHNIQUE: Contiguous axial images were obtained from the base of the skull through the vertex without intravenous contrast. COMPARISON:  None. FINDINGS: Brain: Hypodensities are seen throughout the periventricular white matter consistent with age-indeterminate small vessel ischemic changes, favor chronic.  Otherwise no signs of acute infarct or hemorrhage. There is prominence of the lateral ventricles out of proportion to the degree of cerebral atrophy, which could reflect normal pressure hydrocephalus. Remaining midline structures are unremarkable. No acute extra-axial fluid collections. No mass effect. Vascular: No hyperdense vessel or unexpected calcification. Skull: Normal. Negative for fracture or focal lesion. Sinuses/Orbits: No acute finding. Other: None. IMPRESSION: 1. Likely chronic small-vessel ischemic changes throughout the white matter. 2. Prominence of the lateral ventricles out of proportion to the degree of cerebral atrophy, which could reflect normal pressure hydrocephalus. 3. No acute infarct or hemorrhage. Electronically Signed   By: Sharlet Salina M.D.   On: 11/04/2019 20:22   CT Cervical Spine Wo Contrast  Result Date: 11/04/2019 CLINICAL DATA:  Multiple falls since last week EXAM: CT CERVICAL SPINE WITHOUT CONTRAST TECHNIQUE: Multidetector CT imaging of the cervical spine was performed without intravenous contrast. Multiplanar CT image reconstructions were also generated. COMPARISON:  None. FINDINGS: Alignment: There is mild anterolisthesis of C3 relative to C4, due to prominent facet hypertrophic changes. Slight straightening of the cervical spine due to multilevel spondylosis and facet hypertrophy. Skull base and vertebrae: No acute displaced cervical spine fracture. Soft tissues and spinal canal: No prevertebral fluid or swelling. No visible canal hematoma. Disc levels: There is extensive multilevel spondylosis most pronounced at C4-5, C5-6, and C6-7. There is mild symmetrical neural foraminal narrowing at these levels. Prominent facet hypertrophy at C3-4, along with  a mild anterolisthesis described above, results in significant bilateral neural foraminal encroachment. Upper chest: Airway is patent.  Lung apices are clear. Other: Reconstructed images demonstrate no additional findings.  IMPRESSION: 1. Extensive multilevel cervical spondylosis and facet hypertrophy. No acute fracture. Electronically Signed   By: Randa Ngo M.D.   On: 11/04/2019 20:24   ECHOCARDIOGRAM COMPLETE  Result Date: 11/05/2019    ECHOCARDIOGRAM REPORT   Patient Name:   SHALETA RUACHO Date of Exam: 11/05/2019 Medical Rec #:  [id number]       Height:       62.0 in Accession #:    [id number]      Weight:       93.7 lb Date of Birth:  1922-09-25        BSA:          1.385 m Patient Age:    86 years        BP:           123/53 mmHg Patient Gender: F               HR:           76 bpm. Exam Location:  Inpatient Procedure: 2D Echo Indications:    Syncope R55  History:        Patient has no prior history of Echocardiogram examinations.                 Risk Factors:Hypertension and Dyslipidemia.  Sonographer:    Mikki Santee RDCS (AE) Referring Phys: [id number] [place]  1. Left ventricular ejection fraction, by estimation, is 55 to 60%. The left ventricle has normal function. The left ventricle has no regional wall motion abnormalities. Left ventricular diastolic parameters are consistent with Grade II diastolic dysfunction (pseudonormalization). Elevated left atrial pressure.  2. Right ventricular systolic function is normal. The right ventricular size is normal. There is moderately elevated pulmonary artery systolic pressure. The estimated right ventricular systolic pressure is 07.3 mmHg.  3. Left atrial size was mildly dilated.  4. The mitral valve is abnormal. Moderate mitral annular calcification. Mild to moderate mitral valve regurgitation.  5. The aortic valve was not well visualized. Aortic valve regurgitation is not visualized. No aortic stenosis is present.  6. The inferior vena cava is dilated in size with <50% respiratory variability, suggesting right atrial pressure of 15 mmHg. FINDINGS  Left Ventricle: Left ventricular ejection fraction, by estimation, is 55 to 60%. The left ventricle has normal  function. The left ventricle has no regional wall motion abnormalities. The left ventricular internal cavity size was normal in size. There is  no left ventricular hypertrophy. Left ventricular diastolic parameters are consistent with Grade II diastolic dysfunction (pseudonormalization). Elevated left atrial pressure. Right Ventricle: The right ventricular size is normal. Right vetricular wall thickness was not assessed. Right ventricular systolic function is normal. There is moderately elevated pulmonary artery systolic pressure. The tricuspid regurgitant velocity is  2.81 m/s, and with an assumed right atrial pressure of 15 mmHg, the estimated right ventricular systolic pressure is 71.0 mmHg. Left Atrium: Left atrial size was mildly dilated. Right Atrium: Right atrial size was normal in size. Pericardium: There is no evidence of pericardial effusion. Mitral Valve: The mitral valve is abnormal. Moderate mitral annular calcification. Mild to moderate mitral valve regurgitation. Tricuspid Valve: The tricuspid valve is normal in structure. Tricuspid valve regurgitation is trivial. Aortic Valve: The aortic valve was not well visualized. Aortic valve regurgitation is not visualized. No  aortic stenosis is present. Pulmonic Valve: The pulmonic valve was not well visualized. Pulmonic valve regurgitation is not visualized. Aorta: The aortic root is normal in size and structure. Venous: The inferior vena cava is dilated in size with less than 50% respiratory variability, suggesting right atrial pressure of 15 mmHg. IAS/Shunts: The interatrial septum was not well visualized.  LEFT VENTRICLE PLAX 2D LVIDd:         4.80 cm  Diastology LVIDs:         3.70 cm  LV e' lateral:   10.10 cm/s LV PW:         0.80 cm  LV E/e' lateral: 9.2 LV IVS:        0.90 cm  LV e' medial:    5.33 cm/s LVOT diam:     1.70 cm  LV E/e' medial:  17.4 LV SV:         48 LV SV Index:   35 LVOT Area:     2.27 cm  RIGHT VENTRICLE RV S prime:     9.57 cm/s  TAPSE (M-mode): 1.3 cm LEFT ATRIUM             Index       RIGHT ATRIUM           Index LA diam:        3.90 cm 2.82 cm/m  RA Area:     10.70 cm LA Vol (A2C):   53.6 ml 38.70 ml/m RA Volume:   19.90 ml  14.37 ml/m LA Vol (A4C):   50.3 ml 36.32 ml/m LA Biplane Vol: 53.6 ml 38.70 ml/m  AORTIC VALVE LVOT Vmax:   95.10 cm/s LVOT Vmean:  62.600 cm/s LVOT VTI:    0.213 m  AORTA Ao Root diam: 3.10 cm MITRAL VALVE                TRICUSPID VALVE MV Area (PHT): 3.89 cm     TR Peak grad:   31.6 mmHg MV Decel Time: 195 msec     TR Vmax:        281.00 cm/s MR Peak grad: 135.0 mmHg MR Mean grad: 83.0 mmHg     SHUNTS MR Vmax:      581.00 cm/s   Systemic VTI:  0.21 m MR Vmean:     422.0 cm/s    Systemic Diam: 1.70 cm MV E velocity: 93.00 cm/s MV A velocity: 110.00 cm/s MV E/A ratio:  0.85 Epifanio Lescheshristopher Schumann MD Electronically signed by Epifanio Lescheshristopher Schumann MD Signature Date/Time: 11/05/2019/11:16:40 AM    Final        Subjective: No significant pain, amenable to rehab, eager to get up and moving more but feels weak. No dyspnea on exertion. No bleeding. Eating only a little but no vomiting.  Discharge Exam: Vitals:   11/06/19 2210 11/07/19 0445  BP: (!) 138/45 (!) 138/57  Pulse: 77 68  Resp:  16  Temp: 98.3 F (36.8 C) 98.4 F (36.9 C)  SpO2: 98% 98%   General: Pt is alert, awake, not in acute distress Cardiovascular: RRR, S1/S2 +, no rubs, no gallops Respiratory: CTA bilaterally, no wheezing, no rhonchi Abdominal: Soft, NT, ND, bowel sounds + Extremities: No edema, no cyanosis  Labs: Basic Metabolic Panel: Recent Labs  Lab 11/04/19 1941 11/05/19 0959 11/06/19 0438  NA 138 141 138  K 4.3 4.3 4.1  CL 106 109 106  CO2 23 23 23   GLUCOSE 126* 100* 97  BUN 41* 39* 33*  CREATININE  1.39* 1.45* 1.15*  CALCIUM 8.2* 8.0* 8.2*  MG 2.3  --   --   PHOS 3.8  --   --    Liver Function Tests: Recent Labs  Lab 11/04/19 1941 11/05/19 0959 11/06/19 0438  AST 18 15 13*  ALT 13 11 11   ALKPHOS 33*  33* 31*  BILITOT 2.4* 3.7* 2.1*  PROT 5.7* 5.5* 5.0*  ALBUMIN 3.7 3.5 3.2*   No results for input(s): LIPASE, AMYLASE in the last 168 hours. No results for input(s): AMMONIA in the last 168 hours. CBC: Recent Labs  Lab 11/04/19 1941 11/05/19 0959 11/05/19 1337 11/06/19 0438  WBC 6.6 7.2 8.8 6.3  NEUTROABS 5.3  --   --   --   HGB 5.4* 9.6* 9.8* 9.6*  HCT 16.0* 27.7* 28.3* 28.0*  MCV 155.3* 105.7* 105.6* 105.3*  PLT 167 153 163 147*   Cardiac Enzymes: Recent Labs  Lab 11/04/19 1941  CKTOTAL 121   BNP: Invalid input(s): POCBNP CBG: No results for input(s): GLUCAP in the last 168 hours. D-Dimer No results for input(s): DDIMER in the last 72 hours. Hgb A1c No results for input(s): HGBA1C in the last 72 hours. Lipid Profile No results for input(s): CHOL, HDL, LDLCALC, TRIG, CHOLHDL, LDLDIRECT in the last 72 hours. Thyroid function studies No results for input(s): TSH, T4TOTAL, T3FREE, THYROIDAB in the last 72 hours.  Invalid input(s): FREET3 Anemia work up 01/04/20    11/04/19 1941 11/04/19 2106  VITAMINB12  --  <50*  FOLATE  --  8.8  FERRITIN  --  237  TIBC  --  245*  IRON  --  74  RETICCTPCT 3.2*  --    Urinalysis No results found for: COLORURINE, APPEARANCEUR, LABSPEC, PHURINE, GLUCOSEU, HGBUR, BILIRUBINUR, KETONESUR, PROTEINUR, UROBILINOGEN, NITRITE, LEUKOCYTESUR  Microbiology Recent Results (from the past 240 hour(s))  SARS Coronavirus 2 by RT PCR (hospital order, performed in [facility] hospital lab) Nasopharyngeal Nasopharyngeal Swab     Status: None   Collection Time: 11/04/19  8:53 PM   Specimen: Nasopharyngeal Swab  Result Value Ref Range Status   SARS Coronavirus 2 NEGATIVE NEGATIVE Final    Comment: (NOTE) SARS-CoV-2 target nucleic acids are NOT DETECTED. The SARS-CoV-2 RNA is generally detectable in upper and lower respiratory specimens during the acute phase of infection. The lowest concentration of SARS-CoV-2 viral copies this assay can  detect is 250 copies / mL. A negative result does not preclude SARS-CoV-2 infection and should not be used as the sole basis for treatment or other patient management decisions.  A negative result may occur with improper specimen collection / handling, submission of specimen other than nasopharyngeal swab, presence of viral mutation(s) within the areas targeted by this assay, and inadequate number of viral copies (<250 copies / mL). A negative result must be combined with clinical observations, patient history, and epidemiological information. Fact Sheet for Patients:   01/04/20 Fact Sheet for Healthcare Providers: BoilerBrush.com.cy This test is not yet approved or cleared  by the https://pope.com/ FDA and has been authorized for detection and/or diagnosis of SARS-CoV-2 by FDA under an Emergency Use Authorization (EUA).  This EUA will remain in effect (meaning this test can be used) for the duration of the COVID-19 declaration under Section 564(b)(1) of the Act, 21 U.S.C. section 360bbb-3(b)(1), unless the authorization is terminated or revoked sooner. Performed at [facility], [address] [place], [place]     Time coordinating discharge: Approximately 40 minutes  [id number]  Dario Guardian, MD  [facility] 11/07/2019, 7:35 AM

## 2019-11-08 DIAGNOSIS — R600 Localized edema: Secondary | ICD-10-CM | POA: Diagnosis not present

## 2019-11-08 DIAGNOSIS — Z4789 Encounter for other orthopedic aftercare: Secondary | ICD-10-CM | POA: Diagnosis not present

## 2019-11-08 DIAGNOSIS — Z741 Need for assistance with personal care: Secondary | ICD-10-CM | POA: Diagnosis not present

## 2019-11-08 DIAGNOSIS — U071 COVID-19: Secondary | ICD-10-CM | POA: Diagnosis not present

## 2019-11-08 DIAGNOSIS — R2681 Unsteadiness on feet: Secondary | ICD-10-CM | POA: Diagnosis not present

## 2019-11-08 DIAGNOSIS — G8929 Other chronic pain: Secondary | ICD-10-CM | POA: Diagnosis not present

## 2019-11-08 DIAGNOSIS — R5381 Other malaise: Secondary | ICD-10-CM | POA: Diagnosis not present

## 2019-11-08 DIAGNOSIS — F329 Major depressive disorder, single episode, unspecified: Secondary | ICD-10-CM | POA: Diagnosis not present

## 2019-11-08 DIAGNOSIS — R7989 Other specified abnormal findings of blood chemistry: Secondary | ICD-10-CM | POA: Diagnosis not present

## 2019-11-08 DIAGNOSIS — R279 Unspecified lack of coordination: Secondary | ICD-10-CM | POA: Diagnosis not present

## 2019-11-08 DIAGNOSIS — J440 Chronic obstructive pulmonary disease with acute lower respiratory infection: Secondary | ICD-10-CM | POA: Diagnosis not present

## 2019-11-08 DIAGNOSIS — M5030 Other cervical disc degeneration, unspecified cervical region: Secondary | ICD-10-CM | POA: Diagnosis not present

## 2019-11-08 DIAGNOSIS — M797 Fibromyalgia: Secondary | ICD-10-CM | POA: Diagnosis not present

## 2019-11-08 DIAGNOSIS — I10 Essential (primary) hypertension: Secondary | ICD-10-CM | POA: Diagnosis not present

## 2019-11-08 DIAGNOSIS — Z7189 Other specified counseling: Secondary | ICD-10-CM | POA: Diagnosis not present

## 2019-11-08 DIAGNOSIS — N1832 Chronic kidney disease, stage 3b: Secondary | ICD-10-CM | POA: Diagnosis not present

## 2019-11-08 DIAGNOSIS — R1312 Dysphagia, oropharyngeal phase: Secondary | ICD-10-CM | POA: Diagnosis not present

## 2019-11-08 DIAGNOSIS — H00011 Hordeolum externum right upper eyelid: Secondary | ICD-10-CM | POA: Diagnosis not present

## 2019-11-08 DIAGNOSIS — E559 Vitamin D deficiency, unspecified: Secondary | ICD-10-CM | POA: Diagnosis not present

## 2019-11-08 DIAGNOSIS — Z03818 Encounter for observation for suspected exposure to other biological agents ruled out: Secondary | ICD-10-CM | POA: Diagnosis not present

## 2019-11-08 DIAGNOSIS — Z20822 Contact with and (suspected) exposure to covid-19: Secondary | ICD-10-CM | POA: Diagnosis not present

## 2019-11-08 DIAGNOSIS — Z20828 Contact with and (suspected) exposure to other viral communicable diseases: Secondary | ICD-10-CM | POA: Diagnosis not present

## 2019-11-08 DIAGNOSIS — R2689 Other abnormalities of gait and mobility: Secondary | ICD-10-CM | POA: Diagnosis not present

## 2019-11-08 DIAGNOSIS — R1310 Dysphagia, unspecified: Secondary | ICD-10-CM | POA: Diagnosis not present

## 2019-11-08 DIAGNOSIS — E440 Moderate protein-calorie malnutrition: Secondary | ICD-10-CM | POA: Diagnosis not present

## 2019-11-08 DIAGNOSIS — M6281 Muscle weakness (generalized): Secondary | ICD-10-CM | POA: Diagnosis not present

## 2019-11-08 DIAGNOSIS — L89100 Pressure ulcer of unspecified part of back, unstageable: Secondary | ICD-10-CM | POA: Diagnosis not present

## 2019-11-08 DIAGNOSIS — N179 Acute kidney failure, unspecified: Secondary | ICD-10-CM | POA: Diagnosis not present

## 2019-11-08 DIAGNOSIS — R41841 Cognitive communication deficit: Secondary | ICD-10-CM | POA: Diagnosis not present

## 2019-11-08 DIAGNOSIS — H04123 Dry eye syndrome of bilateral lacrimal glands: Secondary | ICD-10-CM | POA: Diagnosis not present

## 2019-11-08 DIAGNOSIS — R42 Dizziness and giddiness: Secondary | ICD-10-CM | POA: Diagnosis not present

## 2019-11-08 DIAGNOSIS — Z743 Need for continuous supervision: Secondary | ICD-10-CM | POA: Diagnosis not present

## 2019-11-08 DIAGNOSIS — L89103 Pressure ulcer of unspecified part of back, stage 3: Secondary | ICD-10-CM | POA: Diagnosis not present

## 2019-11-08 DIAGNOSIS — E785 Hyperlipidemia, unspecified: Secondary | ICD-10-CM | POA: Diagnosis not present

## 2019-11-08 DIAGNOSIS — D649 Anemia, unspecified: Secondary | ICD-10-CM | POA: Diagnosis not present

## 2019-11-08 DIAGNOSIS — F4321 Adjustment disorder with depressed mood: Secondary | ICD-10-CM | POA: Diagnosis not present

## 2019-11-08 DIAGNOSIS — D519 Vitamin B12 deficiency anemia, unspecified: Secondary | ICD-10-CM | POA: Diagnosis not present

## 2019-11-08 DIAGNOSIS — W19XXXA Unspecified fall, initial encounter: Secondary | ICD-10-CM | POA: Diagnosis not present

## 2019-11-08 DIAGNOSIS — F3289 Other specified depressive episodes: Secondary | ICD-10-CM | POA: Diagnosis not present

## 2019-11-08 DIAGNOSIS — S2232XD Fracture of one rib, left side, subsequent encounter for fracture with routine healing: Secondary | ICD-10-CM | POA: Diagnosis not present

## 2019-11-08 LAB — SARS CORONAVIRUS 2 BY RT PCR (HOSPITAL ORDER, PERFORMED IN ~~LOC~~ HOSPITAL LAB): SARS Coronavirus 2: NEGATIVE

## 2019-11-08 NOTE — Progress Notes (Signed)
Patient discharge at this time via PTAR in no acute episode. VS stable. 

## 2019-11-08 NOTE — TOC Progression Note (Signed)
Transition of Care [facility]) - Progression Note    Patient Details  Name: Karen Mendez MRN: [id number] Date of Birth: [birth year]  [facility]) CM/SW Contact  Geni Bers, RN Phone Number: 11/08/2019, 4:16 PM  Clinical Narrative:     Transportation was called.Sharin Mons)  Expected Discharge Plan: Skilled Nursing Facility Barriers to Discharge: No Barriers Identified  Expected Discharge Plan and Services Expected Discharge Plan: Skilled Nursing Facility   Discharge Planning Services: CM Consult   Living arrangements for the past 2 months: Single Family Home Expected Discharge Date: 11/07/19                                     Social Determinants of Health (SDOH) Interventions    Readmission Risk Interventions No flowsheet data found.

## 2019-11-08 NOTE — Progress Notes (Signed)
Patient seen and examined today. She has no new complaints, eating a bit of breakfast without nausea or vomiting, sleeping well, but still feels weak. We are awaiting insurance authorization for discharge to SNF. I've added a repeat covid test since we have passed 3 days since the last one. She remains stable for discharge with no changes to DC summary authored 11/07/2019.   [name], MD 11/08/2019 12:41 PM

## 2019-11-08 NOTE — TOC Progression Note (Signed)
Transition of Care [facility]) - Progression Note    Patient Details  Name: Karen Mendez MRN: [id number] Date of Birth: 08-Oct-1922  Transition of Care [facility]) CM/SW Contact  Geni Bers, RN Phone Number: 11/08/2019, 11:03 AM  Clinical Narrative:     Delay with insurance giving SNF authorization for pt's admission. Waiting for insurance company.   Expected Discharge Plan: Skilled Nursing Facility Barriers to Discharge: No Barriers Identified  Expected Discharge Plan and Services Expected Discharge Plan: Skilled Nursing Facility   Discharge Planning Services: CM Consult   Living arrangements for the past 2 months: Single Family Home Expected Discharge Date: 11/07/19                                     Social Determinants of Health (SDOH) Interventions    Readmission Risk Interventions No flowsheet data found.

## 2019-11-08 NOTE — TOC Progression Note (Signed)
Transition of Care [facility]) - Progression Note    Patient Details  Name: SABRIAH HOBBINS MRN: [id number] Date of Birth: May 27, 1923  Transition of Care [facility]) CM/SW Contact  Geni Bers, RN Phone Number: 11/08/2019, 2:17 PM  Clinical Narrative:     Now waiting for COVID test result. SNF requested new COVID test pt has not been COVID vaccinated.   Expected Discharge Plan: Skilled Nursing Facility Barriers to Discharge: No Barriers Identified  Expected Discharge Plan and Services Expected Discharge Plan: Skilled Nursing Facility   Discharge Planning Services: CM Consult   Living arrangements for the past 2 months: Single Family Home Expected Discharge Date: 11/07/19                                     Social Determinants of Health (SDOH) Interventions    Readmission Risk Interventions No flowsheet data found.

## 2019-11-08 NOTE — Progress Notes (Signed)
Occupational Therapy Treatment Patient Details Name: Karen Mendez MRN: [id number] DOB: 12-01-1922 Today's Date: 11/08/2019    History of present illness 86 y.o. female with PMH of HTN, HLD, fibromyalgia and degenerative disc disease presents to ER after a fall and admitted for further worku.  Found to have Hgb 5.4-s/p transfusion, L 4th rib fracture.   OT comments  Treatment limited by patient wanting to return to bed and complaints of dizziness with sitting. Patient mod assist to transfer to side of bed but able to assist. Patient able to initiate movement to return to bed. Patient's motivation limited today. Cont POC.   Follow Up Recommendations  SNF    Equipment Recommendations   (defer to next venue)    Recommendations for Other Services      Precautions / Restrictions Precautions Precautions: Fall Restrictions Weight Bearing Restrictions: No       Mobility Bed Mobility Overal bed mobility: Needs Assistance Bed Mobility: Sit to Supine;Supine to Sit     Supine to sit: Mod assist     General bed mobility comments: Mod assist to initiate movement of legs and assistance with trunk lift off. Patient encoraged to attempt scooting herself forward to edge of bed - patient did attempt but gave up. Bed mat pulled to patient seated at edge with feet on floor. Patient maintained slumped position holding head. Reports dizziness and right eye discomfort. Dizziness improved but did not dissipate. Patient wanting to lay back down and did not attempt to stand though therapist encouraged getting out of bed. Patient able to scoot herself back into the middle of the bed and pull her legs onto the bed.  Transfers                      Balance                                           ADL either performed or assessed with clinical judgement   ADL       Grooming: Wash/dry face;Sitting                                 General ADL Comments:  Washed face seated at side of bed. Patient needed encouragement to participate.     Vision       Perception     Praxis      Cognition Arousal/Alertness: Awake/alert Behavior During Therapy: WFL for tasks assessed/performed Overall Cognitive Status: Within Functional Limits for tasks assessed                                          Exercises     Shoulder Instructions       General Comments      Pertinent Vitals/ Pain       Pain Assessment: Faces Faces Pain Scale: Hurts a little bit Pain Location: Rt eye Pain Descriptors / Indicators: Discomfort Pain Intervention(s): Monitored during session  Home Living                                          Prior Functioning/Environment  Frequency  Min 2X/week        Progress Toward Goals  OT Goals(current goals can now be found in the care plan section)        Plan Discharge plan remains appropriate    Co-evaluation                 AM-PAC OT "6 Clicks" Daily Activity     Outcome Measure   Help from another person eating meals?: None Help from another person taking care of personal grooming?: A Little Help from another person toileting, which includes using toliet, bedpan, or urinal?: A Lot Help from another person bathing (including washing, rinsing, drying)?: A Lot Help from another person to put on and taking off regular upper body clothing?: A Lot Help from another person to put on and taking off regular lower body clothing?: A Lot 6 Click Score: 15    End of Session    OT Visit Diagnosis: Other abnormalities of gait and mobility (R26.89);Muscle weakness (generalized) (M62.81)   Activity Tolerance Patient limited by fatigue   Patient Left in bed;with call bell/phone within reach;with bed alarm set   Nurse Communication  (right eye discomfort)        Time: 6568-1275 OT Time Calculation (min): 12 min  Charges: OT General Charges $OT  Visit: 1 Visit OT Treatments $Therapeutic Activity: 8-22 mins  Derl Barrow, OTR/L [place]  Office [phone] Pager: [place] 11/08/2019, 1:13 PM

## 2019-11-11 DIAGNOSIS — I10 Essential (primary) hypertension: Secondary | ICD-10-CM | POA: Diagnosis not present

## 2019-11-11 DIAGNOSIS — D519 Vitamin B12 deficiency anemia, unspecified: Secondary | ICD-10-CM | POA: Diagnosis not present

## 2019-11-11 DIAGNOSIS — N179 Acute kidney failure, unspecified: Secondary | ICD-10-CM | POA: Diagnosis not present

## 2019-11-11 DIAGNOSIS — E785 Hyperlipidemia, unspecified: Secondary | ICD-10-CM | POA: Diagnosis not present

## 2019-11-18 DIAGNOSIS — N1832 Chronic kidney disease, stage 3b: Secondary | ICD-10-CM | POA: Diagnosis not present

## 2019-11-18 DIAGNOSIS — D519 Vitamin B12 deficiency anemia, unspecified: Secondary | ICD-10-CM | POA: Diagnosis not present

## 2019-11-18 DIAGNOSIS — N179 Acute kidney failure, unspecified: Secondary | ICD-10-CM | POA: Diagnosis not present

## 2019-11-18 DIAGNOSIS — I10 Essential (primary) hypertension: Secondary | ICD-10-CM | POA: Diagnosis not present

## 2019-11-20 DIAGNOSIS — R42 Dizziness and giddiness: Secondary | ICD-10-CM | POA: Diagnosis not present

## 2019-11-20 DIAGNOSIS — I10 Essential (primary) hypertension: Secondary | ICD-10-CM | POA: Diagnosis not present

## 2019-11-27 DIAGNOSIS — I10 Essential (primary) hypertension: Secondary | ICD-10-CM | POA: Diagnosis not present

## 2019-11-27 DIAGNOSIS — G8929 Other chronic pain: Secondary | ICD-10-CM | POA: Diagnosis not present

## 2019-11-28 DIAGNOSIS — L89103 Pressure ulcer of unspecified part of back, stage 3: Secondary | ICD-10-CM | POA: Diagnosis not present

## 2019-12-03 DIAGNOSIS — N179 Acute kidney failure, unspecified: Secondary | ICD-10-CM | POA: Diagnosis not present

## 2019-12-03 DIAGNOSIS — F329 Major depressive disorder, single episode, unspecified: Secondary | ICD-10-CM | POA: Diagnosis not present

## 2019-12-03 DIAGNOSIS — N1832 Chronic kidney disease, stage 3b: Secondary | ICD-10-CM | POA: Diagnosis not present

## 2019-12-03 DIAGNOSIS — D519 Vitamin B12 deficiency anemia, unspecified: Secondary | ICD-10-CM | POA: Diagnosis not present

## 2019-12-03 DIAGNOSIS — H04123 Dry eye syndrome of bilateral lacrimal glands: Secondary | ICD-10-CM | POA: Diagnosis not present

## 2019-12-05 DIAGNOSIS — I10 Essential (primary) hypertension: Secondary | ICD-10-CM | POA: Diagnosis not present

## 2019-12-05 DIAGNOSIS — L89100 Pressure ulcer of unspecified part of back, unstageable: Secondary | ICD-10-CM | POA: Diagnosis not present

## 2019-12-05 DIAGNOSIS — E440 Moderate protein-calorie malnutrition: Secondary | ICD-10-CM | POA: Diagnosis not present

## 2019-12-05 DIAGNOSIS — D519 Vitamin B12 deficiency anemia, unspecified: Secondary | ICD-10-CM | POA: Diagnosis not present

## 2019-12-05 DIAGNOSIS — H00011 Hordeolum externum right upper eyelid: Secondary | ICD-10-CM | POA: Diagnosis not present

## 2019-12-09 DIAGNOSIS — G8929 Other chronic pain: Secondary | ICD-10-CM | POA: Diagnosis not present

## 2019-12-09 DIAGNOSIS — F329 Major depressive disorder, single episode, unspecified: Secondary | ICD-10-CM | POA: Diagnosis not present

## 2019-12-13 DIAGNOSIS — I10 Essential (primary) hypertension: Secondary | ICD-10-CM | POA: Diagnosis not present

## 2019-12-16 DIAGNOSIS — R600 Localized edema: Secondary | ICD-10-CM | POA: Diagnosis not present

## 2019-12-16 DIAGNOSIS — L89100 Pressure ulcer of unspecified part of back, unstageable: Secondary | ICD-10-CM | POA: Diagnosis not present

## 2019-12-16 DIAGNOSIS — R1310 Dysphagia, unspecified: Secondary | ICD-10-CM | POA: Diagnosis not present

## 2019-12-18 DIAGNOSIS — I10 Essential (primary) hypertension: Secondary | ICD-10-CM | POA: Diagnosis not present

## 2019-12-18 DIAGNOSIS — R600 Localized edema: Secondary | ICD-10-CM | POA: Diagnosis not present

## 2019-12-19 DIAGNOSIS — Z7189 Other specified counseling: Secondary | ICD-10-CM | POA: Diagnosis not present

## 2019-12-19 DIAGNOSIS — R42 Dizziness and giddiness: Secondary | ICD-10-CM | POA: Diagnosis not present

## 2019-12-19 DIAGNOSIS — I10 Essential (primary) hypertension: Secondary | ICD-10-CM | POA: Diagnosis not present

## 2019-12-19 DIAGNOSIS — R600 Localized edema: Secondary | ICD-10-CM | POA: Diagnosis not present

## 2019-12-23 DIAGNOSIS — R600 Localized edema: Secondary | ICD-10-CM | POA: Diagnosis not present

## 2019-12-23 DIAGNOSIS — I10 Essential (primary) hypertension: Secondary | ICD-10-CM | POA: Diagnosis not present

## 2019-12-23 DIAGNOSIS — L89100 Pressure ulcer of unspecified part of back, unstageable: Secondary | ICD-10-CM | POA: Diagnosis not present

## 2019-12-25 DIAGNOSIS — I10 Essential (primary) hypertension: Secondary | ICD-10-CM | POA: Diagnosis not present

## 2019-12-25 DIAGNOSIS — R600 Localized edema: Secondary | ICD-10-CM | POA: Diagnosis not present

## 2019-12-26 DIAGNOSIS — N179 Acute kidney failure, unspecified: Secondary | ICD-10-CM | POA: Diagnosis not present

## 2019-12-26 DIAGNOSIS — I10 Essential (primary) hypertension: Secondary | ICD-10-CM | POA: Diagnosis not present

## 2019-12-27 DIAGNOSIS — M797 Fibromyalgia: Secondary | ICD-10-CM | POA: Diagnosis not present

## 2019-12-27 DIAGNOSIS — D519 Vitamin B12 deficiency anemia, unspecified: Secondary | ICD-10-CM | POA: Diagnosis not present

## 2019-12-27 DIAGNOSIS — E440 Moderate protein-calorie malnutrition: Secondary | ICD-10-CM | POA: Diagnosis not present

## 2019-12-27 DIAGNOSIS — N179 Acute kidney failure, unspecified: Secondary | ICD-10-CM | POA: Diagnosis not present

## 2019-12-27 DIAGNOSIS — N1832 Chronic kidney disease, stage 3b: Secondary | ICD-10-CM | POA: Diagnosis not present

## 2019-12-27 DIAGNOSIS — I10 Essential (primary) hypertension: Secondary | ICD-10-CM | POA: Diagnosis not present

## 2019-12-27 DIAGNOSIS — E785 Hyperlipidemia, unspecified: Secondary | ICD-10-CM | POA: Diagnosis not present

## 2019-12-27 DIAGNOSIS — S2232XD Fracture of one rib, left side, subsequent encounter for fracture with routine healing: Secondary | ICD-10-CM | POA: Diagnosis not present

## 2019-12-30 DIAGNOSIS — L89100 Pressure ulcer of unspecified part of back, unstageable: Secondary | ICD-10-CM | POA: Diagnosis not present

## 2020-01-06 DIAGNOSIS — L89100 Pressure ulcer of unspecified part of back, unstageable: Secondary | ICD-10-CM | POA: Diagnosis not present

## 2020-01-06 DIAGNOSIS — F4321 Adjustment disorder with depressed mood: Secondary | ICD-10-CM | POA: Diagnosis not present

## 2020-01-20 DIAGNOSIS — L89100 Pressure ulcer of unspecified part of back, unstageable: Secondary | ICD-10-CM | POA: Diagnosis not present

## 2020-01-20 DIAGNOSIS — L89103 Pressure ulcer of unspecified part of back, stage 3: Secondary | ICD-10-CM | POA: Diagnosis not present

## 2020-02-06 ENCOUNTER — Other Ambulatory Visit: Payer: Self-pay

## 2020-02-06 DIAGNOSIS — Z4789 Encounter for other orthopedic aftercare: Secondary | ICD-10-CM | POA: Diagnosis not present

## 2020-02-06 DIAGNOSIS — L89103 Pressure ulcer of unspecified part of back, stage 3: Secondary | ICD-10-CM | POA: Diagnosis not present

## 2020-02-06 DIAGNOSIS — F3289 Other specified depressive episodes: Secondary | ICD-10-CM | POA: Diagnosis not present

## 2020-02-06 DIAGNOSIS — S2232XD Fracture of one rib, left side, subsequent encounter for fracture with routine healing: Secondary | ICD-10-CM | POA: Diagnosis not present

## 2020-02-06 NOTE — Patient Outreach (Signed)
[facility]) Care Management  02/06/2020  CORAYMA CASHATT [birth year] [id number]   Referral Date: 02/06/20 Referral Source: [employer] Report Date of Discharge: 02/04/20 Facility:  [facility] Insurance: [facility]   Referral received.  No outreach warranted at this time.  Transition of Care calls being completed via EMMI. RN CM will outreach patient for any red flags received.    Plan: RN CM will close case.    Bary Leriche, RN, MSN [facility] Care Management Care Management Coordinator Direct Line [phone] Toll Free: [phone]  Fax: [phone]

## 2020-02-17 DIAGNOSIS — F4321 Adjustment disorder with depressed mood: Secondary | ICD-10-CM | POA: Diagnosis not present

## 2020-02-19 ENCOUNTER — Other Ambulatory Visit: Payer: Self-pay

## 2020-02-19 NOTE — Patient Outreach (Signed)
[facility]) Care Management  02/19/2020  Karen Mendez [birth year] [id number]     Transition of Care Referral  Referral Date: 02/19/2020 Referral Source: [facility] Discharge Report Date of Discharge: 02/17/2020 Facility: [facility] Insurance: [facility]    Referral received. Transition of care calls being completed via EMMI-automated calls. RN CM will outreach patient for any red flags received.     Plan: RN CM will close case at this time.   Antionette Fairy, RN,BSN,CCM [facility] Care Management Telephonic Care Management Coordinator Direct Phone: [phone] Toll Free: [phone] Fax: [phone]

## 2020-02-21 DIAGNOSIS — R2681 Unsteadiness on feet: Secondary | ICD-10-CM | POA: Diagnosis not present

## 2020-02-21 DIAGNOSIS — E440 Moderate protein-calorie malnutrition: Secondary | ICD-10-CM | POA: Diagnosis not present

## 2020-02-21 DIAGNOSIS — M6281 Muscle weakness (generalized): Secondary | ICD-10-CM | POA: Diagnosis not present

## 2020-02-21 DIAGNOSIS — M5030 Other cervical disc degeneration, unspecified cervical region: Secondary | ICD-10-CM | POA: Diagnosis not present

## 2020-02-24 DIAGNOSIS — M6281 Muscle weakness (generalized): Secondary | ICD-10-CM | POA: Diagnosis not present

## 2020-02-24 DIAGNOSIS — F4321 Adjustment disorder with depressed mood: Secondary | ICD-10-CM | POA: Diagnosis not present

## 2020-02-27 DIAGNOSIS — S2232XD Fracture of one rib, left side, subsequent encounter for fracture with routine healing: Secondary | ICD-10-CM | POA: Diagnosis not present

## 2020-02-27 DIAGNOSIS — Z4789 Encounter for other orthopedic aftercare: Secondary | ICD-10-CM | POA: Diagnosis not present

## 2020-02-27 DIAGNOSIS — L89100 Pressure ulcer of unspecified part of back, unstageable: Secondary | ICD-10-CM | POA: Diagnosis not present

## 2020-02-28 DIAGNOSIS — Z03818 Encounter for observation for suspected exposure to other biological agents ruled out: Secondary | ICD-10-CM | POA: Diagnosis not present

## 2020-02-29 DIAGNOSIS — Z4789 Encounter for other orthopedic aftercare: Secondary | ICD-10-CM | POA: Diagnosis not present

## 2020-02-29 DIAGNOSIS — S2232XD Fracture of one rib, left side, subsequent encounter for fracture with routine healing: Secondary | ICD-10-CM | POA: Diagnosis not present

## 2020-02-29 DIAGNOSIS — L89100 Pressure ulcer of unspecified part of back, unstageable: Secondary | ICD-10-CM | POA: Diagnosis not present

## 2020-03-05 DIAGNOSIS — M797 Fibromyalgia: Secondary | ICD-10-CM | POA: Diagnosis not present

## 2020-03-05 DIAGNOSIS — S2232XA Fracture of one rib, left side, initial encounter for closed fracture: Secondary | ICD-10-CM | POA: Diagnosis not present

## 2020-03-05 DIAGNOSIS — W19XXXD Unspecified fall, subsequent encounter: Secondary | ICD-10-CM | POA: Diagnosis not present

## 2020-03-05 DIAGNOSIS — R2681 Unsteadiness on feet: Secondary | ICD-10-CM | POA: Diagnosis not present

## 2020-03-05 DIAGNOSIS — I10 Essential (primary) hypertension: Secondary | ICD-10-CM | POA: Diagnosis not present

## 2020-03-05 DIAGNOSIS — F3289 Other specified depressive episodes: Secondary | ICD-10-CM | POA: Diagnosis not present

## 2020-03-05 DIAGNOSIS — M5030 Other cervical disc degeneration, unspecified cervical region: Secondary | ICD-10-CM | POA: Diagnosis not present

## 2020-03-05 DIAGNOSIS — D649 Anemia, unspecified: Secondary | ICD-10-CM | POA: Diagnosis not present

## 2020-03-05 DIAGNOSIS — E440 Moderate protein-calorie malnutrition: Secondary | ICD-10-CM | POA: Diagnosis not present

## 2020-03-06 DIAGNOSIS — E440 Moderate protein-calorie malnutrition: Secondary | ICD-10-CM | POA: Diagnosis not present

## 2020-03-06 DIAGNOSIS — R2681 Unsteadiness on feet: Secondary | ICD-10-CM | POA: Diagnosis not present

## 2020-03-06 DIAGNOSIS — M797 Fibromyalgia: Secondary | ICD-10-CM | POA: Diagnosis not present

## 2020-03-06 DIAGNOSIS — D649 Anemia, unspecified: Secondary | ICD-10-CM | POA: Diagnosis not present

## 2020-03-06 DIAGNOSIS — I10 Essential (primary) hypertension: Secondary | ICD-10-CM | POA: Diagnosis not present

## 2020-03-06 DIAGNOSIS — W19XXXD Unspecified fall, subsequent encounter: Secondary | ICD-10-CM | POA: Diagnosis not present

## 2020-03-06 DIAGNOSIS — M5030 Other cervical disc degeneration, unspecified cervical region: Secondary | ICD-10-CM | POA: Diagnosis not present

## 2020-03-06 DIAGNOSIS — F3289 Other specified depressive episodes: Secondary | ICD-10-CM | POA: Diagnosis not present

## 2020-03-06 DIAGNOSIS — S2232XA Fracture of one rib, left side, initial encounter for closed fracture: Secondary | ICD-10-CM | POA: Diagnosis not present

## 2020-03-10 DIAGNOSIS — I10 Essential (primary) hypertension: Secondary | ICD-10-CM | POA: Diagnosis not present

## 2020-03-10 DIAGNOSIS — E876 Hypokalemia: Secondary | ICD-10-CM | POA: Diagnosis not present

## 2020-03-10 DIAGNOSIS — F329 Major depressive disorder, single episode, unspecified: Secondary | ICD-10-CM | POA: Diagnosis not present

## 2020-03-10 DIAGNOSIS — M545 Low back pain, unspecified: Secondary | ICD-10-CM | POA: Diagnosis not present

## 2020-03-11 DIAGNOSIS — I10 Essential (primary) hypertension: Secondary | ICD-10-CM | POA: Diagnosis not present

## 2020-03-11 DIAGNOSIS — D649 Anemia, unspecified: Secondary | ICD-10-CM | POA: Diagnosis not present

## 2020-03-11 DIAGNOSIS — M5030 Other cervical disc degeneration, unspecified cervical region: Secondary | ICD-10-CM | POA: Diagnosis not present

## 2020-03-11 DIAGNOSIS — S2232XA Fracture of one rib, left side, initial encounter for closed fracture: Secondary | ICD-10-CM | POA: Diagnosis not present

## 2020-03-11 DIAGNOSIS — W19XXXD Unspecified fall, subsequent encounter: Secondary | ICD-10-CM | POA: Diagnosis not present

## 2020-03-11 DIAGNOSIS — E440 Moderate protein-calorie malnutrition: Secondary | ICD-10-CM | POA: Diagnosis not present

## 2020-03-11 DIAGNOSIS — F3289 Other specified depressive episodes: Secondary | ICD-10-CM | POA: Diagnosis not present

## 2020-03-11 DIAGNOSIS — M797 Fibromyalgia: Secondary | ICD-10-CM | POA: Diagnosis not present

## 2020-03-11 DIAGNOSIS — R2681 Unsteadiness on feet: Secondary | ICD-10-CM | POA: Diagnosis not present

## 2020-03-12 DIAGNOSIS — S2232XA Fracture of one rib, left side, initial encounter for closed fracture: Secondary | ICD-10-CM | POA: Diagnosis not present

## 2020-03-12 DIAGNOSIS — E440 Moderate protein-calorie malnutrition: Secondary | ICD-10-CM | POA: Diagnosis not present

## 2020-03-12 DIAGNOSIS — M797 Fibromyalgia: Secondary | ICD-10-CM | POA: Diagnosis not present

## 2020-03-12 DIAGNOSIS — M5030 Other cervical disc degeneration, unspecified cervical region: Secondary | ICD-10-CM | POA: Diagnosis not present

## 2020-03-12 DIAGNOSIS — I10 Essential (primary) hypertension: Secondary | ICD-10-CM | POA: Diagnosis not present

## 2020-03-12 DIAGNOSIS — F3289 Other specified depressive episodes: Secondary | ICD-10-CM | POA: Diagnosis not present

## 2020-03-12 DIAGNOSIS — R2681 Unsteadiness on feet: Secondary | ICD-10-CM | POA: Diagnosis not present

## 2020-03-12 DIAGNOSIS — W19XXXD Unspecified fall, subsequent encounter: Secondary | ICD-10-CM | POA: Diagnosis not present

## 2020-03-12 DIAGNOSIS — D649 Anemia, unspecified: Secondary | ICD-10-CM | POA: Diagnosis not present

## 2020-03-13 DIAGNOSIS — M5030 Other cervical disc degeneration, unspecified cervical region: Secondary | ICD-10-CM | POA: Diagnosis not present

## 2020-03-13 DIAGNOSIS — R2681 Unsteadiness on feet: Secondary | ICD-10-CM | POA: Diagnosis not present

## 2020-03-13 DIAGNOSIS — W19XXXD Unspecified fall, subsequent encounter: Secondary | ICD-10-CM | POA: Diagnosis not present

## 2020-03-13 DIAGNOSIS — S2232XA Fracture of one rib, left side, initial encounter for closed fracture: Secondary | ICD-10-CM | POA: Diagnosis not present

## 2020-03-13 DIAGNOSIS — F3289 Other specified depressive episodes: Secondary | ICD-10-CM | POA: Diagnosis not present

## 2020-03-13 DIAGNOSIS — M797 Fibromyalgia: Secondary | ICD-10-CM | POA: Diagnosis not present

## 2020-03-13 DIAGNOSIS — I10 Essential (primary) hypertension: Secondary | ICD-10-CM | POA: Diagnosis not present

## 2020-03-13 DIAGNOSIS — D649 Anemia, unspecified: Secondary | ICD-10-CM | POA: Diagnosis not present

## 2020-03-13 DIAGNOSIS — E440 Moderate protein-calorie malnutrition: Secondary | ICD-10-CM | POA: Diagnosis not present

## 2020-03-16 DIAGNOSIS — M797 Fibromyalgia: Secondary | ICD-10-CM | POA: Diagnosis not present

## 2020-03-16 DIAGNOSIS — R2681 Unsteadiness on feet: Secondary | ICD-10-CM | POA: Diagnosis not present

## 2020-03-16 DIAGNOSIS — D649 Anemia, unspecified: Secondary | ICD-10-CM | POA: Diagnosis not present

## 2020-03-16 DIAGNOSIS — F3289 Other specified depressive episodes: Secondary | ICD-10-CM | POA: Diagnosis not present

## 2020-03-16 DIAGNOSIS — I10 Essential (primary) hypertension: Secondary | ICD-10-CM | POA: Diagnosis not present

## 2020-03-16 DIAGNOSIS — E440 Moderate protein-calorie malnutrition: Secondary | ICD-10-CM | POA: Diagnosis not present

## 2020-03-16 DIAGNOSIS — W19XXXD Unspecified fall, subsequent encounter: Secondary | ICD-10-CM | POA: Diagnosis not present

## 2020-03-16 DIAGNOSIS — S2232XA Fracture of one rib, left side, initial encounter for closed fracture: Secondary | ICD-10-CM | POA: Diagnosis not present

## 2020-03-16 DIAGNOSIS — M5030 Other cervical disc degeneration, unspecified cervical region: Secondary | ICD-10-CM | POA: Diagnosis not present

## 2020-03-17 DIAGNOSIS — R2681 Unsteadiness on feet: Secondary | ICD-10-CM | POA: Diagnosis not present

## 2020-03-17 DIAGNOSIS — E440 Moderate protein-calorie malnutrition: Secondary | ICD-10-CM | POA: Diagnosis not present

## 2020-03-17 DIAGNOSIS — M5030 Other cervical disc degeneration, unspecified cervical region: Secondary | ICD-10-CM | POA: Diagnosis not present

## 2020-03-17 DIAGNOSIS — W19XXXD Unspecified fall, subsequent encounter: Secondary | ICD-10-CM | POA: Diagnosis not present

## 2020-03-17 DIAGNOSIS — I10 Essential (primary) hypertension: Secondary | ICD-10-CM | POA: Diagnosis not present

## 2020-03-17 DIAGNOSIS — M797 Fibromyalgia: Secondary | ICD-10-CM | POA: Diagnosis not present

## 2020-03-17 DIAGNOSIS — D649 Anemia, unspecified: Secondary | ICD-10-CM | POA: Diagnosis not present

## 2020-03-17 DIAGNOSIS — F3289 Other specified depressive episodes: Secondary | ICD-10-CM | POA: Diagnosis not present

## 2020-03-17 DIAGNOSIS — S2232XA Fracture of one rib, left side, initial encounter for closed fracture: Secondary | ICD-10-CM | POA: Diagnosis not present

## 2020-03-19 DIAGNOSIS — I10 Essential (primary) hypertension: Secondary | ICD-10-CM | POA: Diagnosis not present

## 2020-03-19 DIAGNOSIS — E440 Moderate protein-calorie malnutrition: Secondary | ICD-10-CM | POA: Diagnosis not present

## 2020-03-19 DIAGNOSIS — R2681 Unsteadiness on feet: Secondary | ICD-10-CM | POA: Diagnosis not present

## 2020-03-19 DIAGNOSIS — W19XXXD Unspecified fall, subsequent encounter: Secondary | ICD-10-CM | POA: Diagnosis not present

## 2020-03-19 DIAGNOSIS — D649 Anemia, unspecified: Secondary | ICD-10-CM | POA: Diagnosis not present

## 2020-03-19 DIAGNOSIS — F3289 Other specified depressive episodes: Secondary | ICD-10-CM | POA: Diagnosis not present

## 2020-03-19 DIAGNOSIS — M5030 Other cervical disc degeneration, unspecified cervical region: Secondary | ICD-10-CM | POA: Diagnosis not present

## 2020-03-19 DIAGNOSIS — S2232XA Fracture of one rib, left side, initial encounter for closed fracture: Secondary | ICD-10-CM | POA: Diagnosis not present

## 2020-03-19 DIAGNOSIS — M797 Fibromyalgia: Secondary | ICD-10-CM | POA: Diagnosis not present

## 2020-03-20 DIAGNOSIS — M797 Fibromyalgia: Secondary | ICD-10-CM | POA: Diagnosis not present

## 2020-03-20 DIAGNOSIS — D649 Anemia, unspecified: Secondary | ICD-10-CM | POA: Diagnosis not present

## 2020-03-20 DIAGNOSIS — R2681 Unsteadiness on feet: Secondary | ICD-10-CM | POA: Diagnosis not present

## 2020-03-20 DIAGNOSIS — E440 Moderate protein-calorie malnutrition: Secondary | ICD-10-CM | POA: Diagnosis not present

## 2020-03-20 DIAGNOSIS — F3289 Other specified depressive episodes: Secondary | ICD-10-CM | POA: Diagnosis not present

## 2020-03-20 DIAGNOSIS — M5030 Other cervical disc degeneration, unspecified cervical region: Secondary | ICD-10-CM | POA: Diagnosis not present

## 2020-03-20 DIAGNOSIS — S2232XA Fracture of one rib, left side, initial encounter for closed fracture: Secondary | ICD-10-CM | POA: Diagnosis not present

## 2020-03-20 DIAGNOSIS — W19XXXD Unspecified fall, subsequent encounter: Secondary | ICD-10-CM | POA: Diagnosis not present

## 2020-03-20 DIAGNOSIS — I10 Essential (primary) hypertension: Secondary | ICD-10-CM | POA: Diagnosis not present

## 2020-03-23 DIAGNOSIS — S2232XA Fracture of one rib, left side, initial encounter for closed fracture: Secondary | ICD-10-CM | POA: Diagnosis not present

## 2020-03-23 DIAGNOSIS — F3289 Other specified depressive episodes: Secondary | ICD-10-CM | POA: Diagnosis not present

## 2020-03-23 DIAGNOSIS — I10 Essential (primary) hypertension: Secondary | ICD-10-CM | POA: Diagnosis not present

## 2020-03-23 DIAGNOSIS — E440 Moderate protein-calorie malnutrition: Secondary | ICD-10-CM | POA: Diagnosis not present

## 2020-03-23 DIAGNOSIS — M5030 Other cervical disc degeneration, unspecified cervical region: Secondary | ICD-10-CM | POA: Diagnosis not present

## 2020-03-23 DIAGNOSIS — W19XXXD Unspecified fall, subsequent encounter: Secondary | ICD-10-CM | POA: Diagnosis not present

## 2020-03-23 DIAGNOSIS — D649 Anemia, unspecified: Secondary | ICD-10-CM | POA: Diagnosis not present

## 2020-03-23 DIAGNOSIS — R2681 Unsteadiness on feet: Secondary | ICD-10-CM | POA: Diagnosis not present

## 2020-03-23 DIAGNOSIS — M797 Fibromyalgia: Secondary | ICD-10-CM | POA: Diagnosis not present

## 2020-03-25 DIAGNOSIS — M797 Fibromyalgia: Secondary | ICD-10-CM | POA: Diagnosis not present

## 2020-03-25 DIAGNOSIS — S2232XA Fracture of one rib, left side, initial encounter for closed fracture: Secondary | ICD-10-CM | POA: Diagnosis not present

## 2020-03-25 DIAGNOSIS — E440 Moderate protein-calorie malnutrition: Secondary | ICD-10-CM | POA: Diagnosis not present

## 2020-03-25 DIAGNOSIS — F3289 Other specified depressive episodes: Secondary | ICD-10-CM | POA: Diagnosis not present

## 2020-03-25 DIAGNOSIS — R2681 Unsteadiness on feet: Secondary | ICD-10-CM | POA: Diagnosis not present

## 2020-03-25 DIAGNOSIS — M5030 Other cervical disc degeneration, unspecified cervical region: Secondary | ICD-10-CM | POA: Diagnosis not present

## 2020-03-25 DIAGNOSIS — I10 Essential (primary) hypertension: Secondary | ICD-10-CM | POA: Diagnosis not present

## 2020-03-25 DIAGNOSIS — W19XXXD Unspecified fall, subsequent encounter: Secondary | ICD-10-CM | POA: Diagnosis not present

## 2020-03-25 DIAGNOSIS — D649 Anemia, unspecified: Secondary | ICD-10-CM | POA: Diagnosis not present

## 2020-03-26 DIAGNOSIS — W19XXXD Unspecified fall, subsequent encounter: Secondary | ICD-10-CM | POA: Diagnosis not present

## 2020-03-26 DIAGNOSIS — R2681 Unsteadiness on feet: Secondary | ICD-10-CM | POA: Diagnosis not present

## 2020-03-26 DIAGNOSIS — E440 Moderate protein-calorie malnutrition: Secondary | ICD-10-CM | POA: Diagnosis not present

## 2020-03-26 DIAGNOSIS — I10 Essential (primary) hypertension: Secondary | ICD-10-CM | POA: Diagnosis not present

## 2020-03-26 DIAGNOSIS — M797 Fibromyalgia: Secondary | ICD-10-CM | POA: Diagnosis not present

## 2020-03-26 DIAGNOSIS — S2232XA Fracture of one rib, left side, initial encounter for closed fracture: Secondary | ICD-10-CM | POA: Diagnosis not present

## 2020-03-26 DIAGNOSIS — D649 Anemia, unspecified: Secondary | ICD-10-CM | POA: Diagnosis not present

## 2020-03-26 DIAGNOSIS — M5030 Other cervical disc degeneration, unspecified cervical region: Secondary | ICD-10-CM | POA: Diagnosis not present

## 2020-03-26 DIAGNOSIS — F3289 Other specified depressive episodes: Secondary | ICD-10-CM | POA: Diagnosis not present

## 2020-03-27 DIAGNOSIS — M797 Fibromyalgia: Secondary | ICD-10-CM | POA: Diagnosis not present

## 2020-03-27 DIAGNOSIS — E440 Moderate protein-calorie malnutrition: Secondary | ICD-10-CM | POA: Diagnosis not present

## 2020-03-27 DIAGNOSIS — F3289 Other specified depressive episodes: Secondary | ICD-10-CM | POA: Diagnosis not present

## 2020-03-27 DIAGNOSIS — I10 Essential (primary) hypertension: Secondary | ICD-10-CM | POA: Diagnosis not present

## 2020-03-27 DIAGNOSIS — R2681 Unsteadiness on feet: Secondary | ICD-10-CM | POA: Diagnosis not present

## 2020-03-27 DIAGNOSIS — M5030 Other cervical disc degeneration, unspecified cervical region: Secondary | ICD-10-CM | POA: Diagnosis not present

## 2020-03-27 DIAGNOSIS — W19XXXD Unspecified fall, subsequent encounter: Secondary | ICD-10-CM | POA: Diagnosis not present

## 2020-03-27 DIAGNOSIS — D649 Anemia, unspecified: Secondary | ICD-10-CM | POA: Diagnosis not present

## 2020-03-27 DIAGNOSIS — S2232XA Fracture of one rib, left side, initial encounter for closed fracture: Secondary | ICD-10-CM | POA: Diagnosis not present

## 2020-03-28 DIAGNOSIS — S2232XD Fracture of one rib, left side, subsequent encounter for fracture with routine healing: Secondary | ICD-10-CM | POA: Diagnosis not present

## 2020-03-28 DIAGNOSIS — Z4789 Encounter for other orthopedic aftercare: Secondary | ICD-10-CM | POA: Diagnosis not present

## 2020-03-28 DIAGNOSIS — L89100 Pressure ulcer of unspecified part of back, unstageable: Secondary | ICD-10-CM | POA: Diagnosis not present

## 2020-03-31 DIAGNOSIS — L89100 Pressure ulcer of unspecified part of back, unstageable: Secondary | ICD-10-CM | POA: Diagnosis not present

## 2020-03-31 DIAGNOSIS — M5030 Other cervical disc degeneration, unspecified cervical region: Secondary | ICD-10-CM | POA: Diagnosis not present

## 2020-03-31 DIAGNOSIS — S2232XA Fracture of one rib, left side, initial encounter for closed fracture: Secondary | ICD-10-CM | POA: Diagnosis not present

## 2020-03-31 DIAGNOSIS — I10 Essential (primary) hypertension: Secondary | ICD-10-CM | POA: Diagnosis not present

## 2020-03-31 DIAGNOSIS — M797 Fibromyalgia: Secondary | ICD-10-CM | POA: Diagnosis not present

## 2020-03-31 DIAGNOSIS — S2232XD Fracture of one rib, left side, subsequent encounter for fracture with routine healing: Secondary | ICD-10-CM | POA: Diagnosis not present

## 2020-03-31 DIAGNOSIS — F3289 Other specified depressive episodes: Secondary | ICD-10-CM | POA: Diagnosis not present

## 2020-03-31 DIAGNOSIS — Z4789 Encounter for other orthopedic aftercare: Secondary | ICD-10-CM | POA: Diagnosis not present

## 2020-03-31 DIAGNOSIS — W19XXXD Unspecified fall, subsequent encounter: Secondary | ICD-10-CM | POA: Diagnosis not present

## 2020-03-31 DIAGNOSIS — D649 Anemia, unspecified: Secondary | ICD-10-CM | POA: Diagnosis not present

## 2020-03-31 DIAGNOSIS — R2681 Unsteadiness on feet: Secondary | ICD-10-CM | POA: Diagnosis not present

## 2020-03-31 DIAGNOSIS — E440 Moderate protein-calorie malnutrition: Secondary | ICD-10-CM | POA: Diagnosis not present

## 2020-04-02 DIAGNOSIS — E440 Moderate protein-calorie malnutrition: Secondary | ICD-10-CM | POA: Diagnosis not present

## 2020-04-02 DIAGNOSIS — M797 Fibromyalgia: Secondary | ICD-10-CM | POA: Diagnosis not present

## 2020-04-02 DIAGNOSIS — I10 Essential (primary) hypertension: Secondary | ICD-10-CM | POA: Diagnosis not present

## 2020-04-02 DIAGNOSIS — R2681 Unsteadiness on feet: Secondary | ICD-10-CM | POA: Diagnosis not present

## 2020-04-02 DIAGNOSIS — S2232XA Fracture of one rib, left side, initial encounter for closed fracture: Secondary | ICD-10-CM | POA: Diagnosis not present

## 2020-04-02 DIAGNOSIS — W19XXXD Unspecified fall, subsequent encounter: Secondary | ICD-10-CM | POA: Diagnosis not present

## 2020-04-02 DIAGNOSIS — D649 Anemia, unspecified: Secondary | ICD-10-CM | POA: Diagnosis not present

## 2020-04-02 DIAGNOSIS — M5030 Other cervical disc degeneration, unspecified cervical region: Secondary | ICD-10-CM | POA: Diagnosis not present

## 2020-04-02 DIAGNOSIS — F3289 Other specified depressive episodes: Secondary | ICD-10-CM | POA: Diagnosis not present

## 2020-04-06 DIAGNOSIS — M797 Fibromyalgia: Secondary | ICD-10-CM | POA: Diagnosis not present

## 2020-04-06 DIAGNOSIS — F3289 Other specified depressive episodes: Secondary | ICD-10-CM | POA: Diagnosis not present

## 2020-04-06 DIAGNOSIS — W19XXXD Unspecified fall, subsequent encounter: Secondary | ICD-10-CM | POA: Diagnosis not present

## 2020-04-06 DIAGNOSIS — D649 Anemia, unspecified: Secondary | ICD-10-CM | POA: Diagnosis not present

## 2020-04-06 DIAGNOSIS — M5030 Other cervical disc degeneration, unspecified cervical region: Secondary | ICD-10-CM | POA: Diagnosis not present

## 2020-04-06 DIAGNOSIS — S2232XA Fracture of one rib, left side, initial encounter for closed fracture: Secondary | ICD-10-CM | POA: Diagnosis not present

## 2020-04-06 DIAGNOSIS — I10 Essential (primary) hypertension: Secondary | ICD-10-CM | POA: Diagnosis not present

## 2020-04-06 DIAGNOSIS — R2681 Unsteadiness on feet: Secondary | ICD-10-CM | POA: Diagnosis not present

## 2020-04-06 DIAGNOSIS — E440 Moderate protein-calorie malnutrition: Secondary | ICD-10-CM | POA: Diagnosis not present

## 2020-04-07 DIAGNOSIS — F3289 Other specified depressive episodes: Secondary | ICD-10-CM | POA: Diagnosis not present

## 2020-04-07 DIAGNOSIS — D649 Anemia, unspecified: Secondary | ICD-10-CM | POA: Diagnosis not present

## 2020-04-07 DIAGNOSIS — E440 Moderate protein-calorie malnutrition: Secondary | ICD-10-CM | POA: Diagnosis not present

## 2020-04-07 DIAGNOSIS — S2232XA Fracture of one rib, left side, initial encounter for closed fracture: Secondary | ICD-10-CM | POA: Diagnosis not present

## 2020-04-07 DIAGNOSIS — W19XXXD Unspecified fall, subsequent encounter: Secondary | ICD-10-CM | POA: Diagnosis not present

## 2020-04-07 DIAGNOSIS — M5030 Other cervical disc degeneration, unspecified cervical region: Secondary | ICD-10-CM | POA: Diagnosis not present

## 2020-04-07 DIAGNOSIS — I10 Essential (primary) hypertension: Secondary | ICD-10-CM | POA: Diagnosis not present

## 2020-04-07 DIAGNOSIS — R2681 Unsteadiness on feet: Secondary | ICD-10-CM | POA: Diagnosis not present

## 2020-04-07 DIAGNOSIS — M797 Fibromyalgia: Secondary | ICD-10-CM | POA: Diagnosis not present

## 2020-04-08 DIAGNOSIS — M5030 Other cervical disc degeneration, unspecified cervical region: Secondary | ICD-10-CM | POA: Diagnosis not present

## 2020-04-08 DIAGNOSIS — S2232XA Fracture of one rib, left side, initial encounter for closed fracture: Secondary | ICD-10-CM | POA: Diagnosis not present

## 2020-04-08 DIAGNOSIS — D649 Anemia, unspecified: Secondary | ICD-10-CM | POA: Diagnosis not present

## 2020-04-08 DIAGNOSIS — F3289 Other specified depressive episodes: Secondary | ICD-10-CM | POA: Diagnosis not present

## 2020-04-08 DIAGNOSIS — M797 Fibromyalgia: Secondary | ICD-10-CM | POA: Diagnosis not present

## 2020-04-08 DIAGNOSIS — E440 Moderate protein-calorie malnutrition: Secondary | ICD-10-CM | POA: Diagnosis not present

## 2020-04-08 DIAGNOSIS — W19XXXD Unspecified fall, subsequent encounter: Secondary | ICD-10-CM | POA: Diagnosis not present

## 2020-04-08 DIAGNOSIS — R2681 Unsteadiness on feet: Secondary | ICD-10-CM | POA: Diagnosis not present

## 2020-04-08 DIAGNOSIS — I10 Essential (primary) hypertension: Secondary | ICD-10-CM | POA: Diagnosis not present

## 2020-04-10 DIAGNOSIS — E440 Moderate protein-calorie malnutrition: Secondary | ICD-10-CM | POA: Diagnosis not present

## 2020-04-10 DIAGNOSIS — W19XXXD Unspecified fall, subsequent encounter: Secondary | ICD-10-CM | POA: Diagnosis not present

## 2020-04-10 DIAGNOSIS — F3289 Other specified depressive episodes: Secondary | ICD-10-CM | POA: Diagnosis not present

## 2020-04-10 DIAGNOSIS — S2232XA Fracture of one rib, left side, initial encounter for closed fracture: Secondary | ICD-10-CM | POA: Diagnosis not present

## 2020-04-10 DIAGNOSIS — R2681 Unsteadiness on feet: Secondary | ICD-10-CM | POA: Diagnosis not present

## 2020-04-10 DIAGNOSIS — D649 Anemia, unspecified: Secondary | ICD-10-CM | POA: Diagnosis not present

## 2020-04-10 DIAGNOSIS — M797 Fibromyalgia: Secondary | ICD-10-CM | POA: Diagnosis not present

## 2020-04-10 DIAGNOSIS — I10 Essential (primary) hypertension: Secondary | ICD-10-CM | POA: Diagnosis not present

## 2020-04-10 DIAGNOSIS — M5030 Other cervical disc degeneration, unspecified cervical region: Secondary | ICD-10-CM | POA: Diagnosis not present

## 2020-04-13 DIAGNOSIS — M797 Fibromyalgia: Secondary | ICD-10-CM | POA: Diagnosis not present

## 2020-04-13 DIAGNOSIS — I10 Essential (primary) hypertension: Secondary | ICD-10-CM | POA: Diagnosis not present

## 2020-04-13 DIAGNOSIS — M5030 Other cervical disc degeneration, unspecified cervical region: Secondary | ICD-10-CM | POA: Diagnosis not present

## 2020-04-13 DIAGNOSIS — E440 Moderate protein-calorie malnutrition: Secondary | ICD-10-CM | POA: Diagnosis not present

## 2020-04-13 DIAGNOSIS — F3289 Other specified depressive episodes: Secondary | ICD-10-CM | POA: Diagnosis not present

## 2020-04-13 DIAGNOSIS — D649 Anemia, unspecified: Secondary | ICD-10-CM | POA: Diagnosis not present

## 2020-04-13 DIAGNOSIS — W19XXXD Unspecified fall, subsequent encounter: Secondary | ICD-10-CM | POA: Diagnosis not present

## 2020-04-13 DIAGNOSIS — R2681 Unsteadiness on feet: Secondary | ICD-10-CM | POA: Diagnosis not present

## 2020-04-13 DIAGNOSIS — S2232XA Fracture of one rib, left side, initial encounter for closed fracture: Secondary | ICD-10-CM | POA: Diagnosis not present

## 2020-04-14 DIAGNOSIS — W19XXXD Unspecified fall, subsequent encounter: Secondary | ICD-10-CM | POA: Diagnosis not present

## 2020-04-14 DIAGNOSIS — M5030 Other cervical disc degeneration, unspecified cervical region: Secondary | ICD-10-CM | POA: Diagnosis not present

## 2020-04-14 DIAGNOSIS — M797 Fibromyalgia: Secondary | ICD-10-CM | POA: Diagnosis not present

## 2020-04-14 DIAGNOSIS — D649 Anemia, unspecified: Secondary | ICD-10-CM | POA: Diagnosis not present

## 2020-04-14 DIAGNOSIS — I10 Essential (primary) hypertension: Secondary | ICD-10-CM | POA: Diagnosis not present

## 2020-04-14 DIAGNOSIS — R2681 Unsteadiness on feet: Secondary | ICD-10-CM | POA: Diagnosis not present

## 2020-04-14 DIAGNOSIS — S2232XA Fracture of one rib, left side, initial encounter for closed fracture: Secondary | ICD-10-CM | POA: Diagnosis not present

## 2020-04-14 DIAGNOSIS — F3289 Other specified depressive episodes: Secondary | ICD-10-CM | POA: Diagnosis not present

## 2020-04-14 DIAGNOSIS — E440 Moderate protein-calorie malnutrition: Secondary | ICD-10-CM | POA: Diagnosis not present

## 2020-04-17 DIAGNOSIS — E440 Moderate protein-calorie malnutrition: Secondary | ICD-10-CM | POA: Diagnosis not present

## 2020-04-17 DIAGNOSIS — S2232XA Fracture of one rib, left side, initial encounter for closed fracture: Secondary | ICD-10-CM | POA: Diagnosis not present

## 2020-04-17 DIAGNOSIS — M5030 Other cervical disc degeneration, unspecified cervical region: Secondary | ICD-10-CM | POA: Diagnosis not present

## 2020-04-17 DIAGNOSIS — F3289 Other specified depressive episodes: Secondary | ICD-10-CM | POA: Diagnosis not present

## 2020-04-17 DIAGNOSIS — M797 Fibromyalgia: Secondary | ICD-10-CM | POA: Diagnosis not present

## 2020-04-17 DIAGNOSIS — I10 Essential (primary) hypertension: Secondary | ICD-10-CM | POA: Diagnosis not present

## 2020-04-17 DIAGNOSIS — R2681 Unsteadiness on feet: Secondary | ICD-10-CM | POA: Diagnosis not present

## 2020-04-17 DIAGNOSIS — D649 Anemia, unspecified: Secondary | ICD-10-CM | POA: Diagnosis not present

## 2020-04-17 DIAGNOSIS — W19XXXD Unspecified fall, subsequent encounter: Secondary | ICD-10-CM | POA: Diagnosis not present

## 2020-04-21 DIAGNOSIS — R2681 Unsteadiness on feet: Secondary | ICD-10-CM | POA: Diagnosis not present

## 2020-04-21 DIAGNOSIS — F3289 Other specified depressive episodes: Secondary | ICD-10-CM | POA: Diagnosis not present

## 2020-04-21 DIAGNOSIS — M5030 Other cervical disc degeneration, unspecified cervical region: Secondary | ICD-10-CM | POA: Diagnosis not present

## 2020-04-21 DIAGNOSIS — W19XXXD Unspecified fall, subsequent encounter: Secondary | ICD-10-CM | POA: Diagnosis not present

## 2020-04-21 DIAGNOSIS — D649 Anemia, unspecified: Secondary | ICD-10-CM | POA: Diagnosis not present

## 2020-04-21 DIAGNOSIS — M797 Fibromyalgia: Secondary | ICD-10-CM | POA: Diagnosis not present

## 2020-04-21 DIAGNOSIS — E440 Moderate protein-calorie malnutrition: Secondary | ICD-10-CM | POA: Diagnosis not present

## 2020-04-21 DIAGNOSIS — F329 Major depressive disorder, single episode, unspecified: Secondary | ICD-10-CM | POA: Diagnosis not present

## 2020-04-21 DIAGNOSIS — I10 Essential (primary) hypertension: Secondary | ICD-10-CM | POA: Diagnosis not present

## 2020-04-21 DIAGNOSIS — E876 Hypokalemia: Secondary | ICD-10-CM | POA: Diagnosis not present

## 2020-04-21 DIAGNOSIS — S2232XA Fracture of one rib, left side, initial encounter for closed fracture: Secondary | ICD-10-CM | POA: Diagnosis not present

## 2020-04-22 DIAGNOSIS — S2232XA Fracture of one rib, left side, initial encounter for closed fracture: Secondary | ICD-10-CM | POA: Diagnosis not present

## 2020-04-22 DIAGNOSIS — E440 Moderate protein-calorie malnutrition: Secondary | ICD-10-CM | POA: Diagnosis not present

## 2020-04-22 DIAGNOSIS — F3289 Other specified depressive episodes: Secondary | ICD-10-CM | POA: Diagnosis not present

## 2020-04-22 DIAGNOSIS — M5030 Other cervical disc degeneration, unspecified cervical region: Secondary | ICD-10-CM | POA: Diagnosis not present

## 2020-04-22 DIAGNOSIS — W19XXXD Unspecified fall, subsequent encounter: Secondary | ICD-10-CM | POA: Diagnosis not present

## 2020-04-22 DIAGNOSIS — D649 Anemia, unspecified: Secondary | ICD-10-CM | POA: Diagnosis not present

## 2020-04-22 DIAGNOSIS — R2681 Unsteadiness on feet: Secondary | ICD-10-CM | POA: Diagnosis not present

## 2020-04-22 DIAGNOSIS — I10 Essential (primary) hypertension: Secondary | ICD-10-CM | POA: Diagnosis not present

## 2020-04-22 DIAGNOSIS — M797 Fibromyalgia: Secondary | ICD-10-CM | POA: Diagnosis not present

## 2020-04-27 DIAGNOSIS — M5030 Other cervical disc degeneration, unspecified cervical region: Secondary | ICD-10-CM | POA: Diagnosis not present

## 2020-04-27 DIAGNOSIS — M797 Fibromyalgia: Secondary | ICD-10-CM | POA: Diagnosis not present

## 2020-04-27 DIAGNOSIS — R2681 Unsteadiness on feet: Secondary | ICD-10-CM | POA: Diagnosis not present

## 2020-04-27 DIAGNOSIS — F3289 Other specified depressive episodes: Secondary | ICD-10-CM | POA: Diagnosis not present

## 2020-04-27 DIAGNOSIS — S2232XA Fracture of one rib, left side, initial encounter for closed fracture: Secondary | ICD-10-CM | POA: Diagnosis not present

## 2020-04-27 DIAGNOSIS — E440 Moderate protein-calorie malnutrition: Secondary | ICD-10-CM | POA: Diagnosis not present

## 2020-04-27 DIAGNOSIS — I10 Essential (primary) hypertension: Secondary | ICD-10-CM | POA: Diagnosis not present

## 2020-04-27 DIAGNOSIS — D649 Anemia, unspecified: Secondary | ICD-10-CM | POA: Diagnosis not present

## 2020-04-27 DIAGNOSIS — W19XXXD Unspecified fall, subsequent encounter: Secondary | ICD-10-CM | POA: Diagnosis not present

## 2020-04-28 DIAGNOSIS — S2232XD Fracture of one rib, left side, subsequent encounter for fracture with routine healing: Secondary | ICD-10-CM | POA: Diagnosis not present

## 2020-04-28 DIAGNOSIS — Z4789 Encounter for other orthopedic aftercare: Secondary | ICD-10-CM | POA: Diagnosis not present

## 2020-04-28 DIAGNOSIS — L89100 Pressure ulcer of unspecified part of back, unstageable: Secondary | ICD-10-CM | POA: Diagnosis not present

## 2020-04-29 DIAGNOSIS — I10 Essential (primary) hypertension: Secondary | ICD-10-CM | POA: Diagnosis not present

## 2020-04-29 DIAGNOSIS — M5030 Other cervical disc degeneration, unspecified cervical region: Secondary | ICD-10-CM | POA: Diagnosis not present

## 2020-04-29 DIAGNOSIS — S2232XD Fracture of one rib, left side, subsequent encounter for fracture with routine healing: Secondary | ICD-10-CM | POA: Diagnosis not present

## 2020-04-29 DIAGNOSIS — E440 Moderate protein-calorie malnutrition: Secondary | ICD-10-CM | POA: Diagnosis not present

## 2020-04-29 DIAGNOSIS — R2681 Unsteadiness on feet: Secondary | ICD-10-CM | POA: Diagnosis not present

## 2020-04-29 DIAGNOSIS — D649 Anemia, unspecified: Secondary | ICD-10-CM | POA: Diagnosis not present

## 2020-04-29 DIAGNOSIS — W19XXXD Unspecified fall, subsequent encounter: Secondary | ICD-10-CM | POA: Diagnosis not present

## 2020-04-29 DIAGNOSIS — M797 Fibromyalgia: Secondary | ICD-10-CM | POA: Diagnosis not present

## 2020-04-29 DIAGNOSIS — F3289 Other specified depressive episodes: Secondary | ICD-10-CM | POA: Diagnosis not present

## 2020-04-30 DIAGNOSIS — Z4789 Encounter for other orthopedic aftercare: Secondary | ICD-10-CM | POA: Diagnosis not present

## 2020-04-30 DIAGNOSIS — L89100 Pressure ulcer of unspecified part of back, unstageable: Secondary | ICD-10-CM | POA: Diagnosis not present

## 2020-04-30 DIAGNOSIS — S2232XD Fracture of one rib, left side, subsequent encounter for fracture with routine healing: Secondary | ICD-10-CM | POA: Diagnosis not present

## 2020-05-04 DIAGNOSIS — F3289 Other specified depressive episodes: Secondary | ICD-10-CM | POA: Diagnosis not present

## 2020-05-04 DIAGNOSIS — R2681 Unsteadiness on feet: Secondary | ICD-10-CM | POA: Diagnosis not present

## 2020-05-04 DIAGNOSIS — M797 Fibromyalgia: Secondary | ICD-10-CM | POA: Diagnosis not present

## 2020-05-04 DIAGNOSIS — D649 Anemia, unspecified: Secondary | ICD-10-CM | POA: Diagnosis not present

## 2020-05-04 DIAGNOSIS — S2232XD Fracture of one rib, left side, subsequent encounter for fracture with routine healing: Secondary | ICD-10-CM | POA: Diagnosis not present

## 2020-05-04 DIAGNOSIS — E440 Moderate protein-calorie malnutrition: Secondary | ICD-10-CM | POA: Diagnosis not present

## 2020-05-04 DIAGNOSIS — I10 Essential (primary) hypertension: Secondary | ICD-10-CM | POA: Diagnosis not present

## 2020-05-04 DIAGNOSIS — M5030 Other cervical disc degeneration, unspecified cervical region: Secondary | ICD-10-CM | POA: Diagnosis not present

## 2020-05-04 DIAGNOSIS — W19XXXD Unspecified fall, subsequent encounter: Secondary | ICD-10-CM | POA: Diagnosis not present

## 2020-05-05 DIAGNOSIS — W19XXXD Unspecified fall, subsequent encounter: Secondary | ICD-10-CM | POA: Diagnosis not present

## 2020-05-05 DIAGNOSIS — M797 Fibromyalgia: Secondary | ICD-10-CM | POA: Diagnosis not present

## 2020-05-05 DIAGNOSIS — E440 Moderate protein-calorie malnutrition: Secondary | ICD-10-CM | POA: Diagnosis not present

## 2020-05-05 DIAGNOSIS — F3289 Other specified depressive episodes: Secondary | ICD-10-CM | POA: Diagnosis not present

## 2020-05-05 DIAGNOSIS — R2681 Unsteadiness on feet: Secondary | ICD-10-CM | POA: Diagnosis not present

## 2020-05-05 DIAGNOSIS — D649 Anemia, unspecified: Secondary | ICD-10-CM | POA: Diagnosis not present

## 2020-05-05 DIAGNOSIS — M5030 Other cervical disc degeneration, unspecified cervical region: Secondary | ICD-10-CM | POA: Diagnosis not present

## 2020-05-05 DIAGNOSIS — S2232XD Fracture of one rib, left side, subsequent encounter for fracture with routine healing: Secondary | ICD-10-CM | POA: Diagnosis not present

## 2020-05-05 DIAGNOSIS — I10 Essential (primary) hypertension: Secondary | ICD-10-CM | POA: Diagnosis not present

## 2020-05-11 DIAGNOSIS — S2232XD Fracture of one rib, left side, subsequent encounter for fracture with routine healing: Secondary | ICD-10-CM | POA: Diagnosis not present

## 2020-05-11 DIAGNOSIS — D649 Anemia, unspecified: Secondary | ICD-10-CM | POA: Diagnosis not present

## 2020-05-11 DIAGNOSIS — E440 Moderate protein-calorie malnutrition: Secondary | ICD-10-CM | POA: Diagnosis not present

## 2020-05-11 DIAGNOSIS — R2681 Unsteadiness on feet: Secondary | ICD-10-CM | POA: Diagnosis not present

## 2020-05-11 DIAGNOSIS — W19XXXD Unspecified fall, subsequent encounter: Secondary | ICD-10-CM | POA: Diagnosis not present

## 2020-05-11 DIAGNOSIS — F3289 Other specified depressive episodes: Secondary | ICD-10-CM | POA: Diagnosis not present

## 2020-05-11 DIAGNOSIS — M5030 Other cervical disc degeneration, unspecified cervical region: Secondary | ICD-10-CM | POA: Diagnosis not present

## 2020-05-11 DIAGNOSIS — M797 Fibromyalgia: Secondary | ICD-10-CM | POA: Diagnosis not present

## 2020-05-11 DIAGNOSIS — I10 Essential (primary) hypertension: Secondary | ICD-10-CM | POA: Diagnosis not present

## 2020-05-14 DIAGNOSIS — W19XXXD Unspecified fall, subsequent encounter: Secondary | ICD-10-CM | POA: Diagnosis not present

## 2020-05-14 DIAGNOSIS — S2232XD Fracture of one rib, left side, subsequent encounter for fracture with routine healing: Secondary | ICD-10-CM | POA: Diagnosis not present

## 2020-05-14 DIAGNOSIS — D649 Anemia, unspecified: Secondary | ICD-10-CM | POA: Diagnosis not present

## 2020-05-14 DIAGNOSIS — I10 Essential (primary) hypertension: Secondary | ICD-10-CM | POA: Diagnosis not present

## 2020-05-14 DIAGNOSIS — M5030 Other cervical disc degeneration, unspecified cervical region: Secondary | ICD-10-CM | POA: Diagnosis not present

## 2020-05-14 DIAGNOSIS — M797 Fibromyalgia: Secondary | ICD-10-CM | POA: Diagnosis not present

## 2020-05-14 DIAGNOSIS — E440 Moderate protein-calorie malnutrition: Secondary | ICD-10-CM | POA: Diagnosis not present

## 2020-05-14 DIAGNOSIS — R2681 Unsteadiness on feet: Secondary | ICD-10-CM | POA: Diagnosis not present

## 2020-05-14 DIAGNOSIS — F3289 Other specified depressive episodes: Secondary | ICD-10-CM | POA: Diagnosis not present

## 2020-05-19 DIAGNOSIS — W19XXXD Unspecified fall, subsequent encounter: Secondary | ICD-10-CM | POA: Diagnosis not present

## 2020-05-19 DIAGNOSIS — S2232XD Fracture of one rib, left side, subsequent encounter for fracture with routine healing: Secondary | ICD-10-CM | POA: Diagnosis not present

## 2020-05-19 DIAGNOSIS — R2681 Unsteadiness on feet: Secondary | ICD-10-CM | POA: Diagnosis not present

## 2020-05-19 DIAGNOSIS — M5030 Other cervical disc degeneration, unspecified cervical region: Secondary | ICD-10-CM | POA: Diagnosis not present

## 2020-05-19 DIAGNOSIS — D649 Anemia, unspecified: Secondary | ICD-10-CM | POA: Diagnosis not present

## 2020-05-19 DIAGNOSIS — F3289 Other specified depressive episodes: Secondary | ICD-10-CM | POA: Diagnosis not present

## 2020-05-19 DIAGNOSIS — E440 Moderate protein-calorie malnutrition: Secondary | ICD-10-CM | POA: Diagnosis not present

## 2020-05-19 DIAGNOSIS — M797 Fibromyalgia: Secondary | ICD-10-CM | POA: Diagnosis not present

## 2020-05-19 DIAGNOSIS — I10 Essential (primary) hypertension: Secondary | ICD-10-CM | POA: Diagnosis not present

## 2020-05-21 DIAGNOSIS — M797 Fibromyalgia: Secondary | ICD-10-CM | POA: Diagnosis not present

## 2020-05-21 DIAGNOSIS — R2681 Unsteadiness on feet: Secondary | ICD-10-CM | POA: Diagnosis not present

## 2020-05-21 DIAGNOSIS — I10 Essential (primary) hypertension: Secondary | ICD-10-CM | POA: Diagnosis not present

## 2020-05-21 DIAGNOSIS — E440 Moderate protein-calorie malnutrition: Secondary | ICD-10-CM | POA: Diagnosis not present

## 2020-05-21 DIAGNOSIS — M5030 Other cervical disc degeneration, unspecified cervical region: Secondary | ICD-10-CM | POA: Diagnosis not present

## 2020-05-21 DIAGNOSIS — W19XXXD Unspecified fall, subsequent encounter: Secondary | ICD-10-CM | POA: Diagnosis not present

## 2020-05-21 DIAGNOSIS — F3289 Other specified depressive episodes: Secondary | ICD-10-CM | POA: Diagnosis not present

## 2020-05-21 DIAGNOSIS — D649 Anemia, unspecified: Secondary | ICD-10-CM | POA: Diagnosis not present

## 2020-05-21 DIAGNOSIS — S2232XD Fracture of one rib, left side, subsequent encounter for fracture with routine healing: Secondary | ICD-10-CM | POA: Diagnosis not present

## 2020-05-25 DIAGNOSIS — M797 Fibromyalgia: Secondary | ICD-10-CM | POA: Diagnosis not present

## 2020-05-25 DIAGNOSIS — R2681 Unsteadiness on feet: Secondary | ICD-10-CM | POA: Diagnosis not present

## 2020-05-25 DIAGNOSIS — W19XXXD Unspecified fall, subsequent encounter: Secondary | ICD-10-CM | POA: Diagnosis not present

## 2020-05-25 DIAGNOSIS — D649 Anemia, unspecified: Secondary | ICD-10-CM | POA: Diagnosis not present

## 2020-05-25 DIAGNOSIS — M5030 Other cervical disc degeneration, unspecified cervical region: Secondary | ICD-10-CM | POA: Diagnosis not present

## 2020-05-25 DIAGNOSIS — E440 Moderate protein-calorie malnutrition: Secondary | ICD-10-CM | POA: Diagnosis not present

## 2020-05-25 DIAGNOSIS — I10 Essential (primary) hypertension: Secondary | ICD-10-CM | POA: Diagnosis not present

## 2020-05-25 DIAGNOSIS — S2232XD Fracture of one rib, left side, subsequent encounter for fracture with routine healing: Secondary | ICD-10-CM | POA: Diagnosis not present

## 2020-05-25 DIAGNOSIS — F3289 Other specified depressive episodes: Secondary | ICD-10-CM | POA: Diagnosis not present

## 2020-05-26 DIAGNOSIS — M549 Dorsalgia, unspecified: Secondary | ICD-10-CM | POA: Diagnosis not present

## 2020-05-26 DIAGNOSIS — I10 Essential (primary) hypertension: Secondary | ICD-10-CM | POA: Diagnosis not present

## 2020-05-26 DIAGNOSIS — H8190 Unspecified disorder of vestibular function, unspecified ear: Secondary | ICD-10-CM | POA: Diagnosis not present

## 2020-05-28 DIAGNOSIS — L89100 Pressure ulcer of unspecified part of back, unstageable: Secondary | ICD-10-CM | POA: Diagnosis not present

## 2020-05-28 DIAGNOSIS — R2681 Unsteadiness on feet: Secondary | ICD-10-CM | POA: Diagnosis not present

## 2020-05-28 DIAGNOSIS — F3289 Other specified depressive episodes: Secondary | ICD-10-CM | POA: Diagnosis not present

## 2020-05-28 DIAGNOSIS — M5030 Other cervical disc degeneration, unspecified cervical region: Secondary | ICD-10-CM | POA: Diagnosis not present

## 2020-05-28 DIAGNOSIS — S2232XD Fracture of one rib, left side, subsequent encounter for fracture with routine healing: Secondary | ICD-10-CM | POA: Diagnosis not present

## 2020-05-28 DIAGNOSIS — M797 Fibromyalgia: Secondary | ICD-10-CM | POA: Diagnosis not present

## 2020-05-28 DIAGNOSIS — Z4789 Encounter for other orthopedic aftercare: Secondary | ICD-10-CM | POA: Diagnosis not present

## 2020-05-28 DIAGNOSIS — D649 Anemia, unspecified: Secondary | ICD-10-CM | POA: Diagnosis not present

## 2020-05-28 DIAGNOSIS — W19XXXD Unspecified fall, subsequent encounter: Secondary | ICD-10-CM | POA: Diagnosis not present

## 2020-05-28 DIAGNOSIS — I10 Essential (primary) hypertension: Secondary | ICD-10-CM | POA: Diagnosis not present

## 2020-05-28 DIAGNOSIS — E440 Moderate protein-calorie malnutrition: Secondary | ICD-10-CM | POA: Diagnosis not present

## 2020-05-31 DIAGNOSIS — L89100 Pressure ulcer of unspecified part of back, unstageable: Secondary | ICD-10-CM | POA: Diagnosis not present

## 2020-05-31 DIAGNOSIS — Z4789 Encounter for other orthopedic aftercare: Secondary | ICD-10-CM | POA: Diagnosis not present

## 2020-05-31 DIAGNOSIS — S2232XD Fracture of one rib, left side, subsequent encounter for fracture with routine healing: Secondary | ICD-10-CM | POA: Diagnosis not present

## 2020-06-01 DIAGNOSIS — M797 Fibromyalgia: Secondary | ICD-10-CM | POA: Diagnosis not present

## 2020-06-01 DIAGNOSIS — F3289 Other specified depressive episodes: Secondary | ICD-10-CM | POA: Diagnosis not present

## 2020-06-01 DIAGNOSIS — I10 Essential (primary) hypertension: Secondary | ICD-10-CM | POA: Diagnosis not present

## 2020-06-01 DIAGNOSIS — M5030 Other cervical disc degeneration, unspecified cervical region: Secondary | ICD-10-CM | POA: Diagnosis not present

## 2020-06-01 DIAGNOSIS — R2681 Unsteadiness on feet: Secondary | ICD-10-CM | POA: Diagnosis not present

## 2020-06-01 DIAGNOSIS — W19XXXD Unspecified fall, subsequent encounter: Secondary | ICD-10-CM | POA: Diagnosis not present

## 2020-06-01 DIAGNOSIS — S2232XD Fracture of one rib, left side, subsequent encounter for fracture with routine healing: Secondary | ICD-10-CM | POA: Diagnosis not present

## 2020-06-01 DIAGNOSIS — E440 Moderate protein-calorie malnutrition: Secondary | ICD-10-CM | POA: Diagnosis not present

## 2020-06-01 DIAGNOSIS — D649 Anemia, unspecified: Secondary | ICD-10-CM | POA: Diagnosis not present

## 2020-06-02 DIAGNOSIS — W19XXXA Unspecified fall, initial encounter: Secondary | ICD-10-CM | POA: Diagnosis not present

## 2020-06-02 DIAGNOSIS — E876 Hypokalemia: Secondary | ICD-10-CM | POA: Diagnosis not present

## 2020-06-02 DIAGNOSIS — F329 Major depressive disorder, single episode, unspecified: Secondary | ICD-10-CM | POA: Diagnosis not present

## 2020-06-09 DIAGNOSIS — I10 Essential (primary) hypertension: Secondary | ICD-10-CM | POA: Diagnosis not present

## 2020-06-09 DIAGNOSIS — E440 Moderate protein-calorie malnutrition: Secondary | ICD-10-CM | POA: Diagnosis not present

## 2020-06-09 DIAGNOSIS — D649 Anemia, unspecified: Secondary | ICD-10-CM | POA: Diagnosis not present

## 2020-06-09 DIAGNOSIS — S2232XD Fracture of one rib, left side, subsequent encounter for fracture with routine healing: Secondary | ICD-10-CM | POA: Diagnosis not present

## 2020-06-09 DIAGNOSIS — R2681 Unsteadiness on feet: Secondary | ICD-10-CM | POA: Diagnosis not present

## 2020-06-09 DIAGNOSIS — M797 Fibromyalgia: Secondary | ICD-10-CM | POA: Diagnosis not present

## 2020-06-09 DIAGNOSIS — F3289 Other specified depressive episodes: Secondary | ICD-10-CM | POA: Diagnosis not present

## 2020-06-09 DIAGNOSIS — W19XXXD Unspecified fall, subsequent encounter: Secondary | ICD-10-CM | POA: Diagnosis not present

## 2020-06-09 DIAGNOSIS — M5030 Other cervical disc degeneration, unspecified cervical region: Secondary | ICD-10-CM | POA: Diagnosis not present

## 2020-06-17 DIAGNOSIS — S2232XD Fracture of one rib, left side, subsequent encounter for fracture with routine healing: Secondary | ICD-10-CM | POA: Diagnosis not present

## 2020-06-17 DIAGNOSIS — W19XXXD Unspecified fall, subsequent encounter: Secondary | ICD-10-CM | POA: Diagnosis not present

## 2020-06-17 DIAGNOSIS — D649 Anemia, unspecified: Secondary | ICD-10-CM | POA: Diagnosis not present

## 2020-06-17 DIAGNOSIS — F3289 Other specified depressive episodes: Secondary | ICD-10-CM | POA: Diagnosis not present

## 2020-06-17 DIAGNOSIS — M5030 Other cervical disc degeneration, unspecified cervical region: Secondary | ICD-10-CM | POA: Diagnosis not present

## 2020-06-17 DIAGNOSIS — M797 Fibromyalgia: Secondary | ICD-10-CM | POA: Diagnosis not present

## 2020-06-17 DIAGNOSIS — R2681 Unsteadiness on feet: Secondary | ICD-10-CM | POA: Diagnosis not present

## 2020-06-17 DIAGNOSIS — I10 Essential (primary) hypertension: Secondary | ICD-10-CM | POA: Diagnosis not present

## 2020-06-17 DIAGNOSIS — E440 Moderate protein-calorie malnutrition: Secondary | ICD-10-CM | POA: Diagnosis not present

## 2020-06-28 DIAGNOSIS — L89100 Pressure ulcer of unspecified part of back, unstageable: Secondary | ICD-10-CM | POA: Diagnosis not present

## 2020-06-28 DIAGNOSIS — S2232XD Fracture of one rib, left side, subsequent encounter for fracture with routine healing: Secondary | ICD-10-CM | POA: Diagnosis not present

## 2020-06-28 DIAGNOSIS — Z4789 Encounter for other orthopedic aftercare: Secondary | ICD-10-CM | POA: Diagnosis not present

## 2020-06-30 DIAGNOSIS — E876 Hypokalemia: Secondary | ICD-10-CM | POA: Diagnosis not present

## 2020-06-30 DIAGNOSIS — J029 Acute pharyngitis, unspecified: Secondary | ICD-10-CM | POA: Diagnosis not present

## 2020-06-30 DIAGNOSIS — H8190 Unspecified disorder of vestibular function, unspecified ear: Secondary | ICD-10-CM | POA: Diagnosis not present

## 2020-06-30 DIAGNOSIS — I4891 Unspecified atrial fibrillation: Secondary | ICD-10-CM | POA: Diagnosis not present

## 2020-07-01 DIAGNOSIS — Z4789 Encounter for other orthopedic aftercare: Secondary | ICD-10-CM | POA: Diagnosis not present

## 2020-07-01 DIAGNOSIS — S2232XD Fracture of one rib, left side, subsequent encounter for fracture with routine healing: Secondary | ICD-10-CM | POA: Diagnosis not present

## 2020-07-01 DIAGNOSIS — L89100 Pressure ulcer of unspecified part of back, unstageable: Secondary | ICD-10-CM | POA: Diagnosis not present

## 2020-07-01 DIAGNOSIS — Z20828 Contact with and (suspected) exposure to other viral communicable diseases: Secondary | ICD-10-CM | POA: Diagnosis not present

## 2020-07-27 DIAGNOSIS — Z4789 Encounter for other orthopedic aftercare: Secondary | ICD-10-CM | POA: Diagnosis not present

## 2020-07-27 DIAGNOSIS — S2232XD Fracture of one rib, left side, subsequent encounter for fracture with routine healing: Secondary | ICD-10-CM | POA: Diagnosis not present

## 2020-07-27 DIAGNOSIS — L89100 Pressure ulcer of unspecified part of back, unstageable: Secondary | ICD-10-CM | POA: Diagnosis not present

## 2020-07-29 DIAGNOSIS — L89100 Pressure ulcer of unspecified part of back, unstageable: Secondary | ICD-10-CM | POA: Diagnosis not present

## 2020-07-29 DIAGNOSIS — Z4789 Encounter for other orthopedic aftercare: Secondary | ICD-10-CM | POA: Diagnosis not present

## 2020-07-29 DIAGNOSIS — S2232XD Fracture of one rib, left side, subsequent encounter for fracture with routine healing: Secondary | ICD-10-CM | POA: Diagnosis not present

## 2020-08-04 DIAGNOSIS — F329 Major depressive disorder, single episode, unspecified: Secondary | ICD-10-CM | POA: Diagnosis not present

## 2020-08-04 DIAGNOSIS — I4891 Unspecified atrial fibrillation: Secondary | ICD-10-CM | POA: Diagnosis not present

## 2020-08-04 DIAGNOSIS — M545 Low back pain, unspecified: Secondary | ICD-10-CM | POA: Diagnosis not present

## 2020-08-26 DIAGNOSIS — L89100 Pressure ulcer of unspecified part of back, unstageable: Secondary | ICD-10-CM | POA: Diagnosis not present

## 2020-08-26 DIAGNOSIS — S2232XD Fracture of one rib, left side, subsequent encounter for fracture with routine healing: Secondary | ICD-10-CM | POA: Diagnosis not present

## 2020-08-26 DIAGNOSIS — Z4789 Encounter for other orthopedic aftercare: Secondary | ICD-10-CM | POA: Diagnosis not present

## 2020-08-29 DIAGNOSIS — S2232XD Fracture of one rib, left side, subsequent encounter for fracture with routine healing: Secondary | ICD-10-CM | POA: Diagnosis not present

## 2020-08-29 DIAGNOSIS — L89100 Pressure ulcer of unspecified part of back, unstageable: Secondary | ICD-10-CM | POA: Diagnosis not present

## 2020-08-29 DIAGNOSIS — Z4789 Encounter for other orthopedic aftercare: Secondary | ICD-10-CM | POA: Diagnosis not present

## 2020-09-08 DIAGNOSIS — W19XXXA Unspecified fall, initial encounter: Secondary | ICD-10-CM | POA: Diagnosis not present

## 2020-09-08 DIAGNOSIS — F339 Major depressive disorder, recurrent, unspecified: Secondary | ICD-10-CM | POA: Diagnosis not present

## 2020-09-08 DIAGNOSIS — L299 Pruritus, unspecified: Secondary | ICD-10-CM | POA: Diagnosis not present

## 2020-09-26 DIAGNOSIS — L89100 Pressure ulcer of unspecified part of back, unstageable: Secondary | ICD-10-CM | POA: Diagnosis not present

## 2020-09-26 DIAGNOSIS — Z4789 Encounter for other orthopedic aftercare: Secondary | ICD-10-CM | POA: Diagnosis not present

## 2020-09-26 DIAGNOSIS — S2232XD Fracture of one rib, left side, subsequent encounter for fracture with routine healing: Secondary | ICD-10-CM | POA: Diagnosis not present

## 2020-09-28 DIAGNOSIS — L89100 Pressure ulcer of unspecified part of back, unstageable: Secondary | ICD-10-CM | POA: Diagnosis not present

## 2020-09-28 DIAGNOSIS — Z4789 Encounter for other orthopedic aftercare: Secondary | ICD-10-CM | POA: Diagnosis not present

## 2020-09-28 DIAGNOSIS — S2232XD Fracture of one rib, left side, subsequent encounter for fracture with routine healing: Secondary | ICD-10-CM | POA: Diagnosis not present

## 2020-09-29 DIAGNOSIS — W19XXXA Unspecified fall, initial encounter: Secondary | ICD-10-CM | POA: Diagnosis not present

## 2020-09-29 DIAGNOSIS — L299 Pruritus, unspecified: Secondary | ICD-10-CM | POA: Diagnosis not present

## 2020-09-29 DIAGNOSIS — E876 Hypokalemia: Secondary | ICD-10-CM | POA: Diagnosis not present

## 2020-10-17 ENCOUNTER — Inpatient Hospital Stay (HOSPITAL_COMMUNITY): Payer: Medicare HMO

## 2020-10-17 ENCOUNTER — Encounter (HOSPITAL_COMMUNITY): Payer: Self-pay | Admitting: Emergency Medicine

## 2020-10-17 ENCOUNTER — Inpatient Hospital Stay (HOSPITAL_COMMUNITY)
Admission: EM | Admit: 2020-10-17 | Discharge: 2020-10-27 | DRG: 956 | Disposition: A | Discharge status: Skilled Nursing Facility | Payer: Medicare HMO | Source: Skilled Nursing Facility | Attending: Internal Medicine | Admitting: Internal Medicine

## 2020-10-17 ENCOUNTER — Inpatient Hospital Stay (HOSPITAL_COMMUNITY): Payer: Medicare HMO | Admitting: Anesthesiology

## 2020-10-17 ENCOUNTER — Encounter (HOSPITAL_COMMUNITY)
Admission: EM | Disposition: A | Discharge status: Skilled Nursing Facility | Payer: Self-pay | Source: Skilled Nursing Facility | Attending: Internal Medicine

## 2020-10-17 ENCOUNTER — Emergency Department (HOSPITAL_COMMUNITY): Payer: Medicare HMO

## 2020-10-17 ENCOUNTER — Other Ambulatory Visit: Payer: Self-pay

## 2020-10-17 DIAGNOSIS — Z8249 Family history of ischemic heart disease and other diseases of the circulatory system: Secondary | ICD-10-CM

## 2020-10-17 DIAGNOSIS — D62 Acute posthemorrhagic anemia: Secondary | ICD-10-CM | POA: Diagnosis not present

## 2020-10-17 DIAGNOSIS — M25551 Pain in right hip: Secondary | ICD-10-CM | POA: Diagnosis not present

## 2020-10-17 DIAGNOSIS — E876 Hypokalemia: Secondary | ICD-10-CM | POA: Diagnosis not present

## 2020-10-17 DIAGNOSIS — Z7982 Long term (current) use of aspirin: Secondary | ICD-10-CM

## 2020-10-17 DIAGNOSIS — Z801 Family history of malignant neoplasm of trachea, bronchus and lung: Secondary | ICD-10-CM | POA: Diagnosis not present

## 2020-10-17 DIAGNOSIS — R12 Heartburn: Secondary | ICD-10-CM | POA: Diagnosis not present

## 2020-10-17 DIAGNOSIS — Z4789 Encounter for other orthopedic aftercare: Secondary | ICD-10-CM | POA: Diagnosis not present

## 2020-10-17 DIAGNOSIS — Y92092 Bedroom in other non-institutional residence as the place of occurrence of the external cause: Secondary | ICD-10-CM

## 2020-10-17 DIAGNOSIS — E782 Mixed hyperlipidemia: Secondary | ICD-10-CM | POA: Diagnosis not present

## 2020-10-17 DIAGNOSIS — M25572 Pain in left ankle and joints of left foot: Secondary | ICD-10-CM | POA: Diagnosis not present

## 2020-10-17 DIAGNOSIS — S2232XD Fracture of one rib, left side, subsequent encounter for fracture with routine healing: Secondary | ICD-10-CM | POA: Diagnosis not present

## 2020-10-17 DIAGNOSIS — I5032 Chronic diastolic (congestive) heart failure: Secondary | ICD-10-CM | POA: Diagnosis not present

## 2020-10-17 DIAGNOSIS — R531 Weakness: Secondary | ICD-10-CM | POA: Diagnosis not present

## 2020-10-17 DIAGNOSIS — W19XXXA Unspecified fall, initial encounter: Secondary | ICD-10-CM | POA: Diagnosis present

## 2020-10-17 DIAGNOSIS — S72001D Fracture of unspecified part of neck of right femur, subsequent encounter for closed fracture with routine healing: Secondary | ICD-10-CM | POA: Diagnosis not present

## 2020-10-17 DIAGNOSIS — Z66 Do not resuscitate: Secondary | ICD-10-CM | POA: Diagnosis not present

## 2020-10-17 DIAGNOSIS — S72001A Fracture of unspecified part of neck of right femur, initial encounter for closed fracture: Secondary | ICD-10-CM | POA: Diagnosis not present

## 2020-10-17 DIAGNOSIS — Z808 Family history of malignant neoplasm of other organs or systems: Secondary | ICD-10-CM | POA: Diagnosis not present

## 2020-10-17 DIAGNOSIS — R262 Difficulty in walking, not elsewhere classified: Secondary | ICD-10-CM | POA: Diagnosis not present

## 2020-10-17 DIAGNOSIS — Z20822 Contact with and (suspected) exposure to covid-19: Secondary | ICD-10-CM | POA: Diagnosis present

## 2020-10-17 DIAGNOSIS — E870 Hyperosmolality and hypernatremia: Secondary | ICD-10-CM

## 2020-10-17 DIAGNOSIS — S32591A Other specified fracture of right pubis, initial encounter for closed fracture: Secondary | ICD-10-CM | POA: Diagnosis present

## 2020-10-17 DIAGNOSIS — Z419 Encounter for procedure for purposes other than remedying health state, unspecified: Secondary | ICD-10-CM

## 2020-10-17 DIAGNOSIS — S72009A Fracture of unspecified part of neck of unspecified femur, initial encounter for closed fracture: Secondary | ICD-10-CM | POA: Diagnosis present

## 2020-10-17 DIAGNOSIS — K2960 Other gastritis without bleeding: Secondary | ICD-10-CM | POA: Diagnosis not present

## 2020-10-17 DIAGNOSIS — K921 Melena: Secondary | ICD-10-CM | POA: Diagnosis not present

## 2020-10-17 DIAGNOSIS — D539 Nutritional anemia, unspecified: Secondary | ICD-10-CM | POA: Diagnosis not present

## 2020-10-17 DIAGNOSIS — K922 Gastrointestinal hemorrhage, unspecified: Secondary | ICD-10-CM

## 2020-10-17 DIAGNOSIS — I10 Essential (primary) hypertension: Secondary | ICD-10-CM | POA: Diagnosis not present

## 2020-10-17 DIAGNOSIS — Z8719 Personal history of other diseases of the digestive system: Secondary | ICD-10-CM | POA: Diagnosis not present

## 2020-10-17 DIAGNOSIS — L89100 Pressure ulcer of unspecified part of back, unstageable: Secondary | ICD-10-CM | POA: Diagnosis not present

## 2020-10-17 DIAGNOSIS — R2689 Other abnormalities of gait and mobility: Secondary | ICD-10-CM | POA: Diagnosis not present

## 2020-10-17 DIAGNOSIS — W06XXXA Fall from bed, initial encounter: Secondary | ICD-10-CM | POA: Diagnosis present

## 2020-10-17 DIAGNOSIS — I110 Hypertensive heart disease with heart failure: Secondary | ICD-10-CM | POA: Diagnosis present

## 2020-10-17 DIAGNOSIS — S72044A Nondisplaced fracture of base of neck of right femur, initial encounter for closed fracture: Secondary | ICD-10-CM | POA: Diagnosis not present

## 2020-10-17 DIAGNOSIS — N179 Acute kidney failure, unspecified: Secondary | ICD-10-CM | POA: Diagnosis not present

## 2020-10-17 DIAGNOSIS — E785 Hyperlipidemia, unspecified: Secondary | ICD-10-CM | POA: Diagnosis not present

## 2020-10-17 DIAGNOSIS — M6281 Muscle weakness (generalized): Secondary | ICD-10-CM | POA: Diagnosis not present

## 2020-10-17 DIAGNOSIS — K317 Polyp of stomach and duodenum: Secondary | ICD-10-CM | POA: Diagnosis not present

## 2020-10-17 DIAGNOSIS — E878 Other disorders of electrolyte and fluid balance, not elsewhere classified: Secondary | ICD-10-CM | POA: Diagnosis not present

## 2020-10-17 DIAGNOSIS — R5381 Other malaise: Secondary | ICD-10-CM | POA: Diagnosis not present

## 2020-10-17 DIAGNOSIS — K920 Hematemesis: Secondary | ICD-10-CM | POA: Diagnosis not present

## 2020-10-17 DIAGNOSIS — M5030 Other cervical disc degeneration, unspecified cervical region: Secondary | ICD-10-CM | POA: Diagnosis not present

## 2020-10-17 DIAGNOSIS — K2950 Unspecified chronic gastritis without bleeding: Secondary | ICD-10-CM | POA: Diagnosis not present

## 2020-10-17 DIAGNOSIS — R410 Disorientation, unspecified: Secondary | ICD-10-CM | POA: Diagnosis not present

## 2020-10-17 DIAGNOSIS — R9431 Abnormal electrocardiogram [ECG] [EKG]: Secondary | ICD-10-CM | POA: Diagnosis not present

## 2020-10-17 DIAGNOSIS — R41841 Cognitive communication deficit: Secondary | ICD-10-CM | POA: Diagnosis not present

## 2020-10-17 DIAGNOSIS — R296 Repeated falls: Secondary | ICD-10-CM | POA: Diagnosis present

## 2020-10-17 DIAGNOSIS — I959 Hypotension, unspecified: Secondary | ICD-10-CM | POA: Diagnosis not present

## 2020-10-17 HISTORY — PX: HIP PINNING,CANNULATED: SHX1758

## 2020-10-17 LAB — BASIC METABOLIC PANEL
Anion gap: 10 (ref 5–15)
BUN: 41 mg/dL — ABNORMAL HIGH (ref 8–23)
CO2: 25 mmol/L (ref 22–32)
Calcium: 8.8 mg/dL — ABNORMAL LOW (ref 8.9–10.3)
Chloride: 106 mmol/L (ref 98–111)
Creatinine, Ser: 0.9 mg/dL (ref 0.44–1.00)
GFR, Estimated: 59 mL/min — ABNORMAL LOW (ref >60)
Glucose, Bld: 111 mg/dL — ABNORMAL HIGH (ref 70–99)
Potassium: 4.5 mmol/L (ref 3.5–5.1)
Sodium: 141 mmol/L (ref 135–145)

## 2020-10-17 LAB — CBC WITH DIFFERENTIAL/PLATELET
Abs Immature Granulocytes: 0.05 10*3/uL (ref 0.00–0.07)
Basophils Absolute: 0 10*3/uL (ref 0.0–0.1)
Basophils Relative: 0 %
Eosinophils Absolute: 0.1 10*3/uL (ref 0.0–0.5)
Eosinophils Relative: 1 %
HCT: 32.2 % — ABNORMAL LOW (ref 36.0–46.0)
Hemoglobin: 10.5 g/dL — ABNORMAL LOW (ref 12.0–15.0)
Immature Granulocytes: 1 %
Lymphocytes Relative: 8 %
Lymphs Abs: 0.8 10*3/uL (ref 0.7–4.0)
MCH: 33.7 pg (ref 26.0–34.0)
MCHC: 32.6 g/dL (ref 30.0–36.0)
MCV: 103.2 fL — ABNORMAL HIGH (ref 80.0–100.0)
Monocytes Absolute: 0.7 10*3/uL (ref 0.1–1.0)
Monocytes Relative: 7 %
Neutro Abs: 8.9 10*3/uL — ABNORMAL HIGH (ref 1.7–7.7)
Neutrophils Relative %: 83 %
Platelets: 190 10*3/uL (ref 150–400)
RBC: 3.12 MIL/uL — ABNORMAL LOW (ref 3.87–5.11)
RDW: 14.2 % (ref 11.5–15.5)
WBC: 10.7 10*3/uL — ABNORMAL HIGH (ref 4.0–10.5)
nRBC: 0 % (ref 0.0–0.2)

## 2020-10-17 LAB — PROTIME-INR
INR: 1.1 (ref 0.8–1.2)
Prothrombin Time: 14 seconds (ref 11.4–15.2)

## 2020-10-17 LAB — RESP PANEL BY RT-PCR (FLU A&B, COVID) ARPGX2
Influenza A by PCR: NEGATIVE
Influenza B by PCR: NEGATIVE
SARS Coronavirus 2 by RT PCR: NEGATIVE

## 2020-10-17 SURGERY — FIXATION, FEMUR, NECK, PERCUTANEOUS, USING SCREW
Anesthesia: General | Site: Hip | Laterality: Right

## 2020-10-17 MED ORDER — HYDROCODONE-ACETAMINOPHEN 5-325 MG PO TABS: 1.0000 | ORAL_TABLET | ORAL | Status: DC | PRN

## 2020-10-17 MED ORDER — HYDROCODONE-ACETAMINOPHEN 7.5-325 MG PO TABS: 1.0000 | ORAL_TABLET | ORAL | Status: DC | PRN

## 2020-10-17 MED ORDER — ADULT MULTIVITAMIN W/MINERALS CH
1.0000 | ORAL_TABLET | Freq: Every day | ORAL | Status: DC
  Administered 2020-10-18 – 2020-10-27 (×10): 1 via ORAL
  Filled 2020-10-17 (×10): qty 1

## 2020-10-17 MED ORDER — POVIDONE-IODINE 10 % EX SWAB: 2.0000 "application " | Freq: Once | CUTANEOUS | Status: DC

## 2020-10-17 MED ORDER — FENTANYL CITRATE (PF) 100 MCG/2ML IJ SOLN
INTRAMUSCULAR | Status: DC | PRN
  Administered 2020-10-17: 50 ug via INTRAVENOUS

## 2020-10-17 MED ORDER — SERTRALINE HCL 50 MG PO TABS
75.0000 mg | ORAL_TABLET | Freq: Every day | ORAL | Status: DC
  Administered 2020-10-17 – 2020-10-27 (×11): 75 mg via ORAL
  Filled 2020-10-17 (×5): qty 1
  Filled 2020-10-17: qty 2
  Filled 2020-10-17 (×3): qty 1
  Filled 2020-10-17 (×2): qty 2

## 2020-10-17 MED ORDER — FENTANYL CITRATE (PF) 100 MCG/2ML IJ SOLN
INTRAMUSCULAR | Status: AC
  Administered 2020-10-17: 25 ug via INTRAVENOUS
  Filled 2020-10-17: qty 2

## 2020-10-17 MED ORDER — ASPIRIN EC 325 MG PO TBEC
325.0000 mg | DELAYED_RELEASE_TABLET | Freq: Every day | ORAL | Status: DC
  Administered 2020-10-18 – 2020-10-19 (×2): 325 mg via ORAL
  Filled 2020-10-17 (×2): qty 1

## 2020-10-17 MED ORDER — CLINDAMYCIN PHOSPHATE 900 MG/50ML IV SOLN
INTRAVENOUS | Status: AC
  Filled 2020-10-17: qty 50

## 2020-10-17 MED ORDER — PHENYLEPHRINE 40 MCG/ML (10ML) SYRINGE FOR IV PUSH (FOR BLOOD PRESSURE SUPPORT)
PREFILLED_SYRINGE | INTRAVENOUS | Status: DC | PRN
  Administered 2020-10-17 (×2): 80 ug via INTRAVENOUS
  Administered 2020-10-17: 120 ug via INTRAVENOUS
  Administered 2020-10-17 (×2): 80 ug via INTRAVENOUS

## 2020-10-17 MED ORDER — HEPARIN SODIUM (PORCINE) 5000 UNIT/ML IJ SOLN: 5000.0000 [IU] | Freq: Three times a day (TID) | INTRAMUSCULAR | Status: DC

## 2020-10-17 MED ORDER — LIDOCAINE 2% (20 MG/ML) 5 ML SYRINGE
INTRAMUSCULAR | Status: DC | PRN
  Administered 2020-10-17: 40 mg via INTRAVENOUS

## 2020-10-17 MED ORDER — FENTANYL CITRATE (PF) 100 MCG/2ML IJ SOLN
50.0000 ug | INTRAMUSCULAR | Status: DC | PRN
  Administered 2020-10-17: 50 ug via INTRAVENOUS
  Filled 2020-10-17: qty 2

## 2020-10-17 MED ORDER — SODIUM CHLORIDE 0.9 % IV SOLN: INTRAVENOUS | Status: DC

## 2020-10-17 MED ORDER — HYDROMORPHONE HCL 1 MG/ML IJ SOLN: 0.5000 mg | INTRAMUSCULAR | Status: DC | PRN

## 2020-10-17 MED ORDER — DOCUSATE SODIUM 100 MG PO CAPS
100.0000 mg | ORAL_CAPSULE | Freq: Two times a day (BID) | ORAL | Status: DC
  Administered 2020-10-17 – 2020-10-27 (×15): 100 mg via ORAL
  Filled 2020-10-17 (×16): qty 1

## 2020-10-17 MED ORDER — ACETAMINOPHEN 325 MG PO TABS
325.0000 mg | ORAL_TABLET | Freq: Four times a day (QID) | ORAL | Status: DC | PRN
  Administered 2020-10-20 – 2020-10-25 (×4): 650 mg via ORAL
  Filled 2020-10-17 (×6): qty 2

## 2020-10-17 MED ORDER — LABETALOL HCL 5 MG/ML IV SOLN
INTRAVENOUS | Status: DC | PRN
  Administered 2020-10-17: 10 mg via INTRAVENOUS

## 2020-10-17 MED ORDER — ONDANSETRON HCL 4 MG/2ML IJ SOLN
INTRAMUSCULAR | Status: DC | PRN
  Administered 2020-10-17: 4 mg via INTRAVENOUS

## 2020-10-17 MED ORDER — MENTHOL 3 MG MT LOZG: 1.0000 | LOZENGE | OROMUCOSAL | Status: DC | PRN

## 2020-10-17 MED ORDER — CLINDAMYCIN PHOSPHATE 900 MG/50ML IV SOLN
900.0000 mg | INTRAVENOUS | Status: AC
  Administered 2020-10-17: 900 mg via INTRAVENOUS

## 2020-10-17 MED ORDER — CARVEDILOL 3.125 MG PO TABS
3.1250 mg | ORAL_TABLET | Freq: Two times a day (BID) | ORAL | Status: DC
  Administered 2020-10-17 – 2020-10-19 (×4): 3.125 mg via ORAL
  Filled 2020-10-17 (×4): qty 1

## 2020-10-17 MED ORDER — ENSURE ENLIVE PO LIQD
237.0000 mL | Freq: Two times a day (BID) | ORAL | Status: DC
  Administered 2020-10-18 – 2020-10-22 (×4): 237 mL via ORAL

## 2020-10-17 MED ORDER — FENTANYL CITRATE (PF) 100 MCG/2ML IJ SOLN
INTRAMUSCULAR | Status: AC
  Filled 2020-10-17: qty 2

## 2020-10-17 MED ORDER — METOCLOPRAMIDE HCL 5 MG PO TABS: 5.0000 mg | ORAL_TABLET | Freq: Three times a day (TID) | ORAL | Status: DC | PRN

## 2020-10-17 MED ORDER — SUCCINYLCHOLINE CHLORIDE 20 MG/ML IJ SOLN
INTRAMUSCULAR | Status: DC | PRN
  Administered 2020-10-17: 100 mg via INTRAVENOUS

## 2020-10-17 MED ORDER — SENNOSIDES-DOCUSATE SODIUM 8.6-50 MG PO TABS: 1.0000 | ORAL_TABLET | Freq: Every evening | ORAL | Status: DC | PRN

## 2020-10-17 MED ORDER — PROPOFOL 10 MG/ML IV BOLUS
INTRAVENOUS | Status: DC | PRN
  Administered 2020-10-17: 120 mg via INTRAVENOUS

## 2020-10-17 MED ORDER — EPHEDRINE SULFATE-NACL 50-0.9 MG/10ML-% IV SOSY
PREFILLED_SYRINGE | INTRAVENOUS | Status: DC | PRN
  Administered 2020-10-17: 5 mg via INTRAVENOUS

## 2020-10-17 MED ORDER — DEXAMETHASONE SODIUM PHOSPHATE 10 MG/ML IJ SOLN
INTRAMUSCULAR | Status: DC | PRN
  Administered 2020-10-17: 5 mg via INTRAVENOUS

## 2020-10-17 MED ORDER — CHLORHEXIDINE GLUCONATE 4 % EX LIQD: 60.0000 mL | Freq: Once | CUTANEOUS | Status: DC

## 2020-10-17 MED ORDER — PROPOFOL 10 MG/ML IV BOLUS
INTRAVENOUS | Status: AC
  Filled 2020-10-17: qty 20

## 2020-10-17 MED ORDER — ROCURONIUM BROMIDE 10 MG/ML (PF) SYRINGE
PREFILLED_SYRINGE | INTRAVENOUS | Status: DC | PRN
  Administered 2020-10-17: 30 mg via INTRAVENOUS

## 2020-10-17 MED ORDER — ACETAMINOPHEN 500 MG PO TABS
500.0000 mg | ORAL_TABLET | Freq: Four times a day (QID) | ORAL | Status: DC
  Administered 2020-10-17 – 2020-10-18 (×3): 500 mg via ORAL
  Filled 2020-10-17 (×3): qty 1

## 2020-10-17 MED ORDER — ONDANSETRON HCL 4 MG/2ML IJ SOLN: 4.0000 mg | Freq: Four times a day (QID) | INTRAMUSCULAR | Status: DC | PRN

## 2020-10-17 MED ORDER — METOCLOPRAMIDE HCL 5 MG/ML IJ SOLN: 5.0000 mg | Freq: Three times a day (TID) | INTRAMUSCULAR | Status: DC | PRN

## 2020-10-17 MED ORDER — SUGAMMADEX SODIUM 200 MG/2ML IV SOLN
INTRAVENOUS | Status: DC | PRN
  Administered 2020-10-17: 300 mg via INTRAVENOUS

## 2020-10-17 MED ORDER — FENTANYL CITRATE (PF) 100 MCG/2ML IJ SOLN
25.0000 ug | INTRAMUSCULAR | Status: DC | PRN
  Administered 2020-10-17: 25 ug via INTRAVENOUS

## 2020-10-17 MED ORDER — PHENOL 1.4 % MT LIQD
1.0000 | OROMUCOSAL | Status: DC | PRN
  Filled 2020-10-17: qty 177

## 2020-10-17 MED ORDER — MORPHINE SULFATE (PF) 2 MG/ML IV SOLN: 0.5000 mg | INTRAVENOUS | Status: DC | PRN

## 2020-10-17 MED ORDER — LACTATED RINGERS IV SOLN: INTRAVENOUS | Status: DC | PRN

## 2020-10-17 MED ORDER — ONDANSETRON HCL 4 MG PO TABS
4.0000 mg | ORAL_TABLET | Freq: Four times a day (QID) | ORAL | Status: DC | PRN
  Administered 2020-10-19: 4 mg via ORAL
  Filled 2020-10-17: qty 1

## 2020-10-17 MED ORDER — SODIUM CHLORIDE 0.9 % IR SOLN
Status: DC | PRN
  Administered 2020-10-17: 1000 mL

## 2020-10-17 MED ORDER — ONDANSETRON HCL 4 MG/2ML IJ SOLN
4.0000 mg | Freq: Once | INTRAMUSCULAR | Status: AC
  Administered 2020-10-17: 4 mg via INTRAVENOUS
  Filled 2020-10-17: qty 2

## 2020-10-17 MED ORDER — CLINDAMYCIN PHOSPHATE 600 MG/50ML IV SOLN
600.0000 mg | Freq: Four times a day (QID) | INTRAVENOUS | Status: AC
  Administered 2020-10-17 – 2020-10-18 (×2): 600 mg via INTRAVENOUS
  Filled 2020-10-17 (×2): qty 50

## 2020-10-17 SURGICAL SUPPLY — 41 items
BAG ZIPLOCK 12X15 (MISCELLANEOUS) IMPLANT
BIT DRILL 4.8X200 CANN (BIT) ×2 IMPLANT
BLADE SAW SGTL 11.0X1.19X90.0M (BLADE) IMPLANT
BNDG GAUZE ELAST 4 BULKY (GAUZE/BANDAGES/DRESSINGS) ×2 IMPLANT
BOOTIES KNEE HIGH SLOAN (MISCELLANEOUS) ×2 IMPLANT
CHLORAPREP W/TINT 26 (MISCELLANEOUS) ×2 IMPLANT
COVER PERINEAL POST (MISCELLANEOUS) ×2 IMPLANT
COVER SURGICAL LIGHT HANDLE (MISCELLANEOUS) ×2 IMPLANT
COVER WAND RF STERILE (DRAPES) IMPLANT
DERMABOND ADVANCED (GAUZE/BANDAGES/DRESSINGS) ×1
DERMABOND ADVANCED .7 DNX12 (GAUZE/BANDAGES/DRESSINGS) ×1 IMPLANT
DRAPE C-ARMOR (DRAPES) ×2 IMPLANT
DRAPE STERI IOBAN 125X83 (DRAPES) ×2 IMPLANT
DRAPE U-SHAPE 47X51 STRL (DRAPES) ×4 IMPLANT
DRESSING MEPILEX FLEX 4X4 (GAUZE/BANDAGES/DRESSINGS) ×1 IMPLANT
DRSG MEPILEX FLEX 4X4 (GAUZE/BANDAGES/DRESSINGS) ×2
DRSG OPSITE POSTOP 4X6 (GAUZE/BANDAGES/DRESSINGS) ×4 IMPLANT
ELECT REM PT RETURN 15FT ADLT (MISCELLANEOUS) ×2 IMPLANT
GAUZE SPONGE 4X4 12PLY STRL (GAUZE/BANDAGES/DRESSINGS) ×2 IMPLANT
GLOVE SRG 8 PF TXTR STRL LF DI (GLOVE) ×1 IMPLANT
GLOVE SURG ENC TEXT LTX SZ7.5 (GLOVE) IMPLANT
GLOVE SURG UNDER POLY LF SZ8 (GLOVE) ×2
GOWN STRL REUS W/TWL XL LVL3 (GOWN DISPOSABLE) ×2 IMPLANT
GUIDEWIRE THREADED 2.8 (WIRE) ×6 IMPLANT
KIT BASIN OR (CUSTOM PROCEDURE TRAY) ×2 IMPLANT
KIT TURNOVER KIT A (KITS) ×2 IMPLANT
MANIFOLD NEPTUNE II (INSTRUMENTS) ×2 IMPLANT
NS IRRIG 1000ML POUR BTL (IV SOLUTION) ×2 IMPLANT
PACK GENERAL/GYN (CUSTOM PROCEDURE TRAY) ×2 IMPLANT
PENCIL SMOKE EVACUATOR (MISCELLANEOUS) IMPLANT
PROTECTOR NERVE ULNAR (MISCELLANEOUS) ×2 IMPLANT
SCREW 16MM THREAD 6.5X75MM (Screw) ×4 IMPLANT
SCREW 16MM THREAD 6.5X85MM (Screw) ×2 IMPLANT
STAPLER VISISTAT (STAPLE) ×2 IMPLANT
SUT VIC AB 2-0 CT1 27 (SUTURE)
SUT VIC AB 2-0 CT1 TAPERPNT 27 (SUTURE) IMPLANT
SUT VIC AB 3-0 PS2 18 (SUTURE)
SUT VIC AB 3-0 PS2 18XBRD (SUTURE) IMPLANT
TAPE CLOTH SURG 4X10 WHT LF (GAUZE/BANDAGES/DRESSINGS) ×2 IMPLANT
TOWEL OR 17X26 10 PK STRL BLUE (TOWEL DISPOSABLE) ×2 IMPLANT
WASHER FLAT 6.5MM (Washer) ×2 IMPLANT

## 2020-10-17 NOTE — Anesthesia Postprocedure Evaluation (Signed)
Anesthesia Post Note  Patient: Karen Mendez  Procedure(s) Performed: CANNULATED HIP PINNING (Right Hip)     Patient location during evaluation: PACU Anesthesia Type: General Level of consciousness: patient cooperative, oriented and sedated Pain management: pain level controlled (pt says she is comfortable) Vital Signs Assessment: post-procedure vital signs reviewed and stable Respiratory status: spontaneous breathing, nonlabored ventilation, respiratory function stable and patient connected to nasal cannula oxygen Cardiovascular status: blood pressure returned to baseline and stable Postop Assessment: no apparent nausea or vomiting Anesthetic complications: no   No complications documented.  Last Vitals:  Vitals:   10/17/20 1417 10/17/20 1433  BP:    Pulse:    Resp:    Temp: (!) 35.8 C 36.5 C  SpO2: 94%     Last Pain:  Vitals:   10/17/20 1417  TempSrc:   PainSc: 5                  Pearse Shiffler,E. Macenzie Burford

## 2020-10-17 NOTE — Transfer of Care (Signed)
Immediate Anesthesia Transfer of Care Note  Patient: WRENNA SAKS  Procedure(s) Performed: CANNULATED HIP PINNING (Right Hip)  Patient Location: PACU  Anesthesia Type:General  Level of Consciousness: sedated, patient cooperative and responds to stimulation  Airway & Oxygen Therapy: Patient Spontanous Breathing and Patient connected to face mask oxygen  Post-op Assessment: Report given to RN and Post -op Vital signs reviewed and stable  Post vital signs: Reviewed and stable  Last Vitals:  Vitals Value Taken Time  BP 109/57 10/17/20 1356  Temp    Pulse 95 10/17/20 1358  Resp 22 10/17/20 1358  SpO2 98 % 10/17/20 1358  Vitals shown include unvalidated device data.  Last Pain:  Vitals:   10/17/20 0752  TempSrc:   PainSc: 10-Worst pain ever         Complications: No complications documented.

## 2020-10-17 NOTE — H&P (Signed)
History and Physical        Hospital Admission Note Date: 10/17/2020  Patient name: Karen Mendez Medical record number: [id number] Date of birth: August 09, 1922 Age: 87 y.o. Gender: female  PCP: [name], MD  Patient coming from: [place] At baseline, ambulates: wheelchair only  Chief Complaint    Chief Complaint  Patient presents with  . Fall      HPI:   This a very pleasant 87 year old female with past medical history of hypertension, hyperlipidemia, fibromyalgia, recurrent falls, DDD from [facility] who presented to the ED after accidentally falling out of bed last night with right hip pain.  She was helped back into bed but had persistent right hip pain so came for evaluation.  She did not lose consciousness. She had a dream last night that she needed to get out of bed, swung her legs around to the side of the bed, and fell. Says that she has not walked for the past year after she had a fall with several rib fractures and only uses a wheelchair to get around. No chest pain, shortness of breath, leg swelling or any other complaints.    ED Course: Afebrile, hypertensive, on room air. Notable Labs: Sodium 141, K4.5, glucose 111, BUN 41, creatinine 0.9, WBC 10.7, Hb 10.5, platelets 190. Notable Imaging: Right hip XR-nondisplaced fracture through the right femoral neck and 2 nondisplaced fractures through the medial right inferior pubic ramus, one of the fracture lines extends into the pubic symphysis. Patient received fentanyl, Zofran, maintenance IV fluids.  Orthopedic surgery was consulted by the ED provider.    Vitals:   10/17/20 0921 10/17/20 1000  BP: (!) 149/49 (!) 164/50  Pulse: 89 81  Resp: 20 19  Temp:    SpO2: 96% 98%     Review of Systems:  Review of Systems  All other systems reviewed and are negative.   Medical/Social/Family History   Past Medical  History: Past Medical History:  Diagnosis Date  . Chest pain 09/26/2017  . Degeneration of cervical intervertebral disc   . Elevated fasting blood sugar   . Fibromyalgia   . Hyperlipidemia   . Hypertension   . Lumbar disc narrowing   . Macular degeneration   . Restless leg syndrome   . Stable angina (HCC) 01/25/2018  . Thoracic spondylosis   . Uninodular goiter     Past Surgical History:  Procedure Laterality Date  . CATARACT EXTRACTION Right   . CATARACT EXTRACTION Left   . TOTAL ABDOMINAL HYSTERECTOMY      Medications: Prior to Admission medications   Medication Sig Start Date End Date Taking? Authorizing Provider  acetaminophen (TYLENOL) 500 MG tablet Take 1,000 mg by mouth 3 (three) times daily.   Yes [provider]  aspirin EC 81 MG tablet Take 81 mg by mouth daily.   Yes [provider]  carvedilol (COREG) 3.125 MG tablet Take 1 tablet (3.125 mg total) by mouth 2 (two) times daily. 11/07/19  Yes Tyrone Nine, MD  furosemide (LASIX) 20 MG tablet Take 20 mg by mouth daily. 10/05/20  Yes [provider]  loratadine (CLARITIN) 10 MG tablet Take 10 mg by mouth daily.   Yes [provider]  meclizine (ANTIVERT) 12.5 MG tablet Take 12.5 mg by mouth 2 (two) times daily.   Yes [provider]  Multiple Vitamins-Minerals (OCULAR VITAMINS) TABS Take 1 tablet by mouth 2 (two) times daily.   Yes [provider]  Polyethyl Glycol-Propyl Glycol (SYSTANE) 0.4-0.3 % SOLN Place 2 drops into both eyes 2 (two) times daily.   Yes [provider]  potassium chloride (MICRO-K) 10 MEQ CR capsule Take 10 mEq by mouth daily. 09/22/20  Yes [provider]  sertraline (ZOLOFT) 50 MG tablet Take 75 mg by mouth daily. 09/24/20  Yes [provider]  vitamin B-12 (CYANOCOBALAMIN) 1000 MCG tablet Take 1 tablet (1,000 mcg total) by mouth daily. 11/07/19  Yes Tyrone Nine, MD    Allergies:   Allergies  Allergen Reactions  .  Etodolac Other (See Comments)  . Nefazodone Other (See Comments)  . Penicillins Rash  . Sulfamethoxazole Rash    Social History:  reports that she has never smoked. She has never used smokeless tobacco. She reports that she does not drink alcohol and does not use drugs.  Family History: Family History  Problem Relation Age of Onset  . Heart attack Mother   . Hypertension Mother   . Heart attack Father   . Congestive Heart Failure Sister   . Lung cancer Sister   . Melanoma Child      Objective   Physical Exam: Blood pressure (!) 164/50, pulse 81, temperature 98.1 F (36.7 C), temperature source Oral, resp. rate 19, SpO2 98 %.  Physical Exam Vitals and nursing note reviewed.  Constitutional:      Appearance: Normal appearance.  HENT:     Head: Normocephalic and atraumatic.  Eyes:     Conjunctiva/sclera: Conjunctivae normal.  Cardiovascular:     Rate and Rhythm: Normal rate and regular rhythm.  Pulmonary:     Effort: Pulmonary effort is normal.     Breath sounds: Normal breath sounds.  Abdominal:     General: Abdomen is flat.     Palpations: Abdomen is soft.  Musculoskeletal:     Comments: RLE externally rotated and shortened  Skin:    Coloration: Skin is not jaundiced or pale.  Neurological:     Mental Status: She is alert. Mental status is at baseline.  Psychiatric:        Mood and Affect: Mood normal.        Behavior: Behavior normal.     LABS on Admission: I have personally reviewed all the labs and imaging below    Basic Metabolic Panel: Recent Labs  Lab 10/17/20 0955  NA 141  K 4.5  CL 106  CO2 25  GLUCOSE 111*  BUN 41*  CREATININE 0.90  CALCIUM 8.8*   Liver Function Tests: No results for input(s): AST, ALT, ALKPHOS, BILITOT, PROT, ALBUMIN in the last 168 hours. No results for input(s): LIPASE, AMYLASE in the last 168 hours. No results for input(s): AMMONIA in the last 168 hours. CBC: Recent Labs  Lab 10/17/20 0955  WBC 10.7*   NEUTROABS 8.9*  HGB 10.5*  HCT 32.2*  MCV 103.2*  PLT 190   Cardiac Enzymes: No results for input(s): CKTOTAL, CKMB, CKMBINDEX, TROPONINI in the last 168 hours. BNP: Invalid input(s): POCBNP CBG: No results for input(s): GLUCAP in the last 168 hours.  Radiological Exams on Admission:  DG Hip Unilat With Pelvis 2-3 Views Right  Result Date: 10/17/2020 CLINICAL DATA:  Pain after fall EXAM: DG HIP (WITH OR WITHOUT  PELVIS) 2-3V RIGHT COMPARISON:  None. FINDINGS: There is a nondisplaced fracture through the right femoral neck without femoral head dislocation. There also appear to be 2 nondisplaced fractures through the medial right inferior pubic ramus, apparently extending into the pubic symphysis. No other acute abnormalities are identified. IMPRESSION: 1. Nondisplaced fracture through the right femoral neck. 2. 2 nondisplaced fractures through the medial right inferior pubic ramus. One of the fracture lines extends into the pubic symphysis. Electronically Signed   By: Gerome Sam III M.D   On: 10/17/2020 09:19      EKG: normal EKG, normal sinus rhythm, unchanged from previous tracings   A & P   Principal Problem:   Hip fracture [facility]) Active Problems:   Essential hypertension   Mixed hyperlipidemia   Fall   1. Nondisplaced fracture through the right femoral neck and 2 nondisplaced fractures through the medial right inferior pubic ramus s/p fall from bed a. Essentially bedboundand uses a wheelchair to ambulate at baseline  b. According to NSQIP, the patient has a 0.80% chance of perioperative MICA for the anticipated procedure. No further workup needed c. Will continue with pain regimen per hip fracture orderset d. NPO pending Orthopedic surgery consult  2. Hypertension a. Continue beta blocker  3. Hyperlipidemia a. not on statin  4. Chronic Diastolic CHF, not in exacerbation a. Holding home lasix b. Decrease IV fluids to prevent volume overload  5. Mild to  moderate mitral regurgitation, stable   DVT prophylaxis: Heparin   Code Status: Prior  Diet: N.p.o. Family Communication: Admission, patients condition and plan of care including tests being ordered have been discussed with the patient who indicates understanding and agrees with the plan and Code Status. Patient's granddaughter was updated  Disposition Plan: The appropriate patient status for this patient is INPATIENT. Inpatient status is judged to be reasonable and necessary in order to provide the required intensity of service to ensure the patient's safety. The patient's presenting symptoms, physical exam findings, and initial radiographic and laboratory data in the context of their chronic comorbidities is felt to place them at high risk for further clinical deterioration. Furthermore, it is not anticipated that the patient will be medically stable for discharge from the hospital within 2 midnights of admission. The following factors support the patient status of inpatient.   " The patient's presenting symptoms include fall, rib pain. " The worrisome physical exam findings include right hip pain. " The initial radiographic and laboratory data are worrisome because of right hip fracture. " The chronic co-morbidities include as above.   * I certify that at the point of admission it is my clinical judgment that the patient will require inpatient hospital care spanning beyond 2 midnights from the point of admission due to high intensity of service, high risk for further deterioration and high frequency of surveillance required.*   Consultants  . Orthopedic surgery  Procedures  . None  Time Spent on Admission: 55 minutes    Jae Dire, DO [facility]  10/17/2020, 11:27 AM

## 2020-10-17 NOTE — Consult Note (Signed)
Brief orthopedic consult note: Full consult note to follow  Orthopedics was consulted for right valgus impacted femoral neck fracture after fall.  X-rays reveal fracture amenable to closed reduction percutaneous pin fixation.  I called the patient's family to discuss plan.  They would like to proceed with surgery.  Patient is nonambulatory but does do some standing with assistance to help with transfers.  Surgery would benefit the patient for pain control, avoiding further displacement of the fracture and for early weightbearing.  We will proceed with surgery once evaluated by the hospitalist team and deemed suitable.  Do not need to stop aspirin for surgery.    Please keep NPO.

## 2020-10-17 NOTE — Op Note (Signed)
  NADIYA PIERATT female 87 y.o. 10/17/2020  PreOperative Diagnosis: Right nondisplaced femoral neck fracture  PostOperative Diagnosis: Same  PROCEDURE: Close reduction percutaneous screw fixation of right femoral neck fracture  SURGEON: Dub Mikes, MD  ASSISTANT: None  ANESTHESIA: General endotracheal tube  FINDINGS: Minimally displaced right femoral neck fracture  IMPLANTS: [employer] partially-threaded cannulated screws  INDICATIONS:87 y.o. female had a fall from bed and had hip pain.  She was brought to the emergency department where x-rays revealed a nondisplaced femoral neck fracture with slight valgus tilt.  This was amenable to closed reduction percutaneous pin fixation.  I had a discussion with the patient and her family and they opted to proceed with surgery for pain control and early weightbearing.  Patient is relatively nonambulatory but does stand some for transfers.  She is amenable to surgery and wished to proceed.   Patient understood the risks, benefits and alternatives to surgery which include but are not limited to wound healing complications, infection, nonunion, malunion, need for further surgery as well as damage to surrounding structures. They also understood the potential for continued pain in that there were no guarantees of acceptable outcome After weighing these risks the patient opted to proceed with surgery.  PROCEDURE:  [place] Patient was identified in the preoperative holding area.  The right hip was marked by myself.  Consent was signed by myself and the patient.  She was taken to the operative suite where general endotracheal tube anesthesia was induced without difficulty.  Preoperative antibiotics were given.  Clindamycin was used.  She was transferred to the [facility] table.  The right foot was placed in the boot and the left leg was strapped to the cross beam using pad and Coban.  Fluoroscopic images were obtained and we are able to visualize the  femoral neck in the AP and lateral plane well.  Then the right hip was prepped and draped in usual sterile fashion.  Surgical timeout was performed.  I began by localizing the femoral neck.  Then through percutaneous stab technique K wires were placed in an inverted triangle position of the femoral neck and seated well into the femoral head.  3 pins were placed.  Then through stab incisions 3 cannulated screws that were partially threaded were placed over the wires with good fixation and bite.  Afterwards final fluoroscopic images were obtained and showed appropriate position of the screws.  The femoral neck did not displace.  Then the wound was irrigated copiously with normal saline.  Staples were used to close the wounds.  Mepilex and soft dressing were placed about the wound.  She was then awakened from anesthesia and taken recovery in stable condition.  There were no complications.   POST OPERATIVE INSTRUCTIONS: Weightbearing as tolerated to right lower extremity for transfers only Okay to continue DVT prophylaxis Will limit narcotic use given geriatric patient Upon discharge she will follow-up in 2 weeks for wound check, suture removal and x-rays of the operative hip.  TOURNIQUET TIME: No tourniquet was used  BLOOD LOSS:  Minimal         DRAINS: none         SPECIMEN: none       COMPLICATIONS:  * No complications entered in OR log *         Disposition: PACU - hemodynamically stable.         Condition: stable

## 2020-10-17 NOTE — Anesthesia Preprocedure Evaluation (Addendum)
Anesthesia Evaluation  Patient identified by MRN, date of birth, ID band Patient awake    Reviewed: Allergy & Precautions, NPO status , Patient's Chart, lab work & pertinent test results, reviewed documented beta blocker date and time   History of Anesthesia Complications Negative for: history of anesthetic complications  Airway Mallampati: II  TM Distance: >3 FB Neck ROM: Full    Dental  (+) Dental Advisory Given   Pulmonary    breath sounds clear to auscultation       Cardiovascular hypertension, Pt. on medications and Pt. on home beta blockers (-) angina Rhythm:Regular Rate:Normal  '21 ECHO: EF 55-60%, grade 2 DD, mild-mod MR   Neuro/Psych 10/17/2020 SARS coronavirus NEG    GI/Hepatic Neg liver ROS, Nauseated earlier today   Endo/Other  negative endocrine ROS  Renal/GU negative Renal ROS     Musculoskeletal  (+) Arthritis ,   Abdominal   Peds  Hematology  (+) Blood dyscrasia (Hb 10.5), anemia ,   Anesthesia Other Findings   Reproductive/Obstetrics                            Anesthesia Physical Anesthesia Plan  ASA: III  Anesthesia Plan: General   Post-op Pain Management:    Induction: Intravenous and Rapid sequence  PONV Risk Score and Plan: 3 and Ondansetron, Dexamethasone and Treatment may vary due to age or medical condition  Airway Management Planned: Oral ETT  Additional Equipment: None  Intra-op Plan:   Post-operative Plan: Extubation in OR  Informed Consent: I have reviewed the patients History and Physical, chart, labs and discussed the procedure including the risks, benefits and alternatives for the proposed anesthesia with the patient or authorized representative who has indicated his/her understanding and acceptance.     Dental advisory given  Plan Discussed with: CRNA and Surgeon  Anesthesia Plan Comments:        Anesthesia Quick Evaluation

## 2020-10-17 NOTE — Consult Note (Signed)
Reason for Consult: Right minimally displaced femoral neck fracture Referring Physician: [facility]  Karen Mendez is an 87 y.o. female.  HPI: Patient fell from her bed last night.  She had pain and was helped back into bed but had continued pain and therefore was brought to the emergency department where x-rays revealed a minimally displaced right femoral neck fracture.  Patient is nonambulatory at baseline but does do some standing for transfers and assistance with mobilization.  She complains of pain in the right hip.  She denies pain elsewhere.  Pain is sharp in quality and severe.  Rest improves her symptoms.  She takes an 81 mg aspirin at baseline and no other anticoagulants.  Past Medical History:  Diagnosis Date  . Chest pain 09/26/2017  . Degeneration of cervical intervertebral disc   . Elevated fasting blood sugar   . Fibromyalgia   . Hyperlipidemia   . Hypertension   . Lumbar disc narrowing   . Macular degeneration   . Restless leg syndrome   . Stable angina (HCC) 01/25/2018  . Thoracic spondylosis   . Uninodular goiter     Past Surgical History:  Procedure Laterality Date  . CATARACT EXTRACTION Right   . CATARACT EXTRACTION Left   . TOTAL ABDOMINAL HYSTERECTOMY      Family History  Problem Relation Age of Onset  . Heart attack Mother   . Hypertension Mother   . Heart attack Father   . Congestive Heart Failure Sister   . Lung cancer Sister   . Melanoma Child     Social History:  reports that she has never smoked. She has never used smokeless tobacco. She reports that she does not drink alcohol and does not use drugs.  Allergies:  Allergies  Allergen Reactions  . Etodolac Other (See Comments)  . Nefazodone Other (See Comments)  . Penicillins Rash  . Sulfamethoxazole Rash    Medications: I have reviewed the patient's current medications.  Results for orders placed or performed during the hospital encounter of 10/17/20 (from the past  48 hour(s))  Basic metabolic panel     Status: Abnormal   Collection Time: 10/17/20  9:55 AM  Result Value Ref Range   Sodium 141 135 - 145 mmol/L   Potassium 4.5 3.5 - 5.1 mmol/L   Chloride 106 98 - 111 mmol/L   CO2 25 22 - 32 mmol/L   Glucose, Bld 111 (H) 70 - 99 mg/dL    Comment: Glucose reference range applies only to samples taken after fasting for at least 8 hours.   BUN 41 (H) 8 - 23 mg/dL   Creatinine, Ser 6.50 0.44 - 1.00 mg/dL   Calcium 8.8 (L) 8.9 - 10.3 mg/dL   GFR, Estimated 59 (L) >60 mL/min    Comment: (NOTE) Calculated using the CKD-EPI Creatinine Equation (2021)    Anion gap 10 5 - 15    Comment: Performed at [facility], [address] [place], [place] [address]  CBC WITH DIFFERENTIAL     Status: Abnormal   Collection Time: 10/17/20  9:55 AM  Result Value Ref Range   WBC 10.7 (H) 4.0 - 10.5 K/uL   RBC 3.12 (L) 3.87 - 5.11 MIL/uL   Hemoglobin 10.5 (L) 12.0 - 15.0 g/dL   HCT 68.1 (L) 27.5 - 17.0 %   MCV 103.2 (H) 80.0 - 100.0 fL   MCH 33.7 26.0 - 34.0 pg   MCHC 32.6 30.0 - 36.0 g/dL  RDW 14.2 11.5 - 15.5 %   Platelets 190 150 - 400 K/uL   nRBC 0.0 0.0 - 0.2 %   Neutrophils Relative % 83 %   Neutro Abs 8.9 (H) 1.7 - 7.7 K/uL   Lymphocytes Relative 8 %   Lymphs Abs 0.8 0.7 - 4.0 K/uL   Monocytes Relative 7 %   Monocytes Absolute 0.7 0.1 - 1.0 K/uL   Eosinophils Relative 1 %   Eosinophils Absolute 0.1 0.0 - 0.5 K/uL   Basophils Relative 0 %   Basophils Absolute 0.0 0.0 - 0.1 K/uL   Immature Granulocytes 1 %   Abs Immature Granulocytes 0.05 0.00 - 0.07 K/uL    Comment: Performed at [facility], [address], [place], [place] [address]  Protime-INR     Status: None   Collection Time: 10/17/20  9:55 AM  Result Value Ref Range   Prothrombin Time 14.0 11.4 - 15.2 seconds   INR 1.1 0.8 - 1.2    Comment: (NOTE) INR goal varies based on device and disease states. Performed at [facility], [address] [place], [place] [address]   Resp Panel by RT-PCR (Flu A&B, Covid) Nasopharyngeal Swab     Status: None   Collection Time: 10/17/20  9:55 AM   Specimen: Nasopharyngeal Swab; Nasopharyngeal(NP) swabs in vial transport medium  Result Value Ref Range   SARS Coronavirus 2 by RT PCR NEGATIVE NEGATIVE    Comment: (NOTE) SARS-CoV-2 target nucleic acids are NOT DETECTED.  The SARS-CoV-2 RNA is generally detectable in upper respiratory specimens during the acute phase of infection. The lowest concentration of SARS-CoV-2 viral copies this assay can detect is 138 copies/mL. A negative result does not preclude SARS-Cov-2 infection and should not be used as the sole basis for treatment or other patient management decisions. A negative result may occur with  improper specimen collection/handling, submission of specimen other than nasopharyngeal swab, presence of viral mutation(s) within the areas targeted by this assay, and inadequate number of viral copies(<138 copies/mL). A negative result must be combined with clinical observations, patient history, and epidemiological information. The expected result is Negative.  Fact Sheet for Patients:  BloggerCourse.com  Fact Sheet for Healthcare Providers:  SeriousBroker.it  This test is no t yet approved or cleared by the [place] FDA and  has been authorized for detection and/or diagnosis of SARS-CoV-2 by FDA under an Emergency Use Authorization (EUA). This EUA will remain  in effect (meaning this test can be used) for the duration of the COVID-19 declaration under Section 564(b)(1) of the Act, 21 U.S.C.section 360bbb-3(b)(1), unless the authorization is terminated  or revoked sooner.       Influenza A by PCR NEGATIVE NEGATIVE   Influenza B by PCR NEGATIVE NEGATIVE    Comment: (NOTE) The Xpert Xpress SARS-CoV-2/FLU/RSV plus assay is intended as an aid in the diagnosis of influenza  from Nasopharyngeal swab specimens and should not be used as a sole basis for treatment. Nasal washings and aspirates are unacceptable for Xpert Xpress SARS-CoV-2/FLU/RSV testing.  Fact Sheet for Patients: BloggerCourse.com  Fact Sheet for Healthcare Providers: SeriousBroker.it  This test is not yet approved or cleared by the [place] FDA and has been authorized for detection and/or diagnosis of SARS-CoV-2 by FDA under an Emergency Use Authorization (EUA). This EUA will remain in effect (meaning this test can be used) for the duration of the COVID-19 declaration under Section 564(b)(1) of the Act, 21 U.S.C. section 360bbb-3(b)(1), unless the  authorization is terminated or revoked.  Performed at [facility], [address] [place], [place] [address]   Type and screen [facility]     Status: None   Collection Time: 10/17/20 10:00 AM  Result Value Ref Range   ABO/RH(D) A POS    Antibody Screen NEG    Sample Expiration      10/20/2020,2359 Performed at [facility], [address] [place], [place] [address]     DG Hip Unilat With Pelvis 2-3 Views Right  Result Date: 10/17/2020 CLINICAL DATA:  Pain after fall EXAM: DG HIP (WITH OR WITHOUT PELVIS) 2-3V RIGHT COMPARISON:  None. FINDINGS: There is a nondisplaced fracture through the right femoral neck without femoral head dislocation. There also appear to be 2 nondisplaced fractures through the medial right inferior pubic ramus, apparently extending into the pubic symphysis. No other acute abnormalities are identified. IMPRESSION: 1. Nondisplaced fracture through the right femoral neck. 2. 2 nondisplaced fractures through the medial right inferior pubic ramus. One of the fracture lines extends into the pubic symphysis. Electronically Signed   By: Gerome Sam III M.D   On: 10/17/2020 09:19    Review of Systems   Constitutional: Negative.   HENT: Negative.   Respiratory: Negative.   Cardiovascular: Negative.   Gastrointestinal: Negative.   Musculoskeletal:       Right hip pain  Skin: Negative.   Neurological: Negative.   Psychiatric/Behavioral: Negative.    Blood pressure (!) 135/44, pulse 73, temperature 98.1 F (36.7 C), temperature source Oral, resp. rate 14, SpO2 100 %. Physical Exam HENT:     Head: Atraumatic.     Mouth/Throat:     Mouth: Mucous membranes are moist.  Eyes:     Extraocular Movements: Extraocular movements intact.  Cardiovascular:     Rate and Rhythm: Normal rate.     Pulses: Normal pulses.  Pulmonary:     Effort: Pulmonary effort is normal.  Abdominal:     General: Abdomen is flat.  Musculoskeletal:     Comments: Right hip pain with palpation.  No pain or crepitance with palpation of femur, knee, leg or ankle.  Able to move ankle joint actively without difficulty.  No evidence of upper or left lower extremity injury.  Skin:    General: Skin is warm.  Neurological:     General: No focal deficit present.     Mental Status: She is alert.  Psychiatric:        Mood and Affect: Mood normal.     Assessment/Plan: We will plan for closed reduction percutaneous pinning of her right femoral neck fracture as this is amenable to this type of surgery given the minimal displacement.  This will aid with pain control and make it easier for her to transfer and mobilize.  I did discuss this with her family and the patient and they are agreeable to proceed.  We did discuss the risks, benefits and alternatives to surgery which include but are not limited to wound healing complications, nonunion, malunion, need for further surgery, damage to surrounding structures and briefly discussed the anesthetic risk including death.  Despite these risks they would like to proceed with surgery.  Terance Hart 10/17/2020, 12:20 PM

## 2020-10-17 NOTE — ED Provider Notes (Signed)
[facility]-EMERGENCY DEPT Provider Note   CSN: [id number] Arrival date & time: 10/17/20  0736     History Chief Complaint  Patient presents with  . Fall    Karen Mendez is a 87 y.o. female.  HPI She presents for evaluation of pain in the right hip which occurred when she rolled out of bed accidentally last night.  Following the incident she was helped back into bed but had persistent pain in the right hip so came here for evaluation.  She states she cannot recall if she was given her morning medications but she does remember not eating this morning.  She denies other recent problems.  She states she "has not walked in years."  She denies recent illnesses.  There are no other known active modifying factors.    Past Medical History:  Diagnosis Date  . Chest pain 09/26/2017  . Degeneration of cervical intervertebral disc   . Elevated fasting blood sugar   . Fibromyalgia   . Hyperlipidemia   . Hypertension   . Lumbar disc narrowing   . Macular degeneration   . Restless leg syndrome   . Stable angina (HCC) 01/25/2018  . Thoracic spondylosis   . Uninodular goiter     Patient Active Problem List   Diagnosis Date Noted  . Malnutrition of moderate degree 11/05/2019  . Fall 11/04/2019  . Macrocytic anemia 11/04/2019  . Left rib fracture 11/04/2019  . Fibromyalgia   . Macular degeneration   . Essential hypertension   . Uninodular goiter   . Mixed hyperlipidemia   . Elevated fasting blood sugar   . Lumbar disc narrowing   . Restless leg syndrome   . Thoracic spondylosis   . Degeneration of cervical intervertebral disc     Past Surgical History:  Procedure Laterality Date  . CATARACT EXTRACTION Right   . CATARACT EXTRACTION Left   . TOTAL ABDOMINAL HYSTERECTOMY       OB History   No obstetric history on file.     Family History  Problem Relation Age of Onset  . Heart attack Mother   . Hypertension Mother   . Heart attack Father   .  Congestive Heart Failure Sister   . Lung cancer Sister   . Melanoma Child     Social History   Tobacco Use  . Smoking status: Never Smoker  . Smokeless tobacco: Never Used  Vaping Use  . Vaping Use: Never used  Substance Use Topics  . Alcohol use: Never  . Drug use: Never    Home Medications Prior to Admission medications   Medication Sig Start Date End Date Taking? Authorizing Provider  acetaminophen (TYLENOL) 500 MG tablet Take 1,000 mg by mouth 3 (three) times daily.   Yes [provider]  aspirin EC 81 MG tablet Take 81 mg by mouth daily.   Yes [provider]  carvedilol (COREG) 3.125 MG tablet Take 1 tablet (3.125 mg total) by mouth 2 (two) times daily. 11/07/19  Yes Tyrone Nine, MD  furosemide (LASIX) 20 MG tablet Take 20 mg by mouth daily. 10/05/20  Yes [provider]  loratadine (CLARITIN) 10 MG tablet Take 10 mg by mouth daily.   Yes [provider]  meclizine (ANTIVERT) 12.5 MG tablet Take 12.5 mg by mouth 2 (two) times daily.   Yes [provider]  Multiple Vitamins-Minerals (OCULAR VITAMINS) TABS Take 1 tablet by mouth 2 (two) times daily.   Yes [provider]  Polyethyl Glycol-Propyl Glycol (SYSTANE) 0.4-0.3 % SOLN Place 2 drops into both eyes 2 (two) times daily.   Yes [provider]  potassium chloride (MICRO-K) 10 MEQ CR capsule Take 10 mEq by mouth daily. 09/22/20  Yes [provider]  sertraline (ZOLOFT) 50 MG tablet Take 75 mg by mouth daily. 09/24/20  Yes [provider]  vitamin B-12 (CYANOCOBALAMIN) 1000 MCG tablet Take 1 tablet (1,000 mcg total) by mouth daily. 11/07/19  Yes Tyrone Nine, MD    Allergies    Etodolac, Nefazodone, Penicillins, and Sulfamethoxazole  Review of Systems   Review of Systems  All other systems reviewed and are negative.   Physical Exam Updated Vital Signs BP (!) 164/50   Pulse 81   Temp 98.1 F (36.7 C) (Oral)   Resp 19   SpO2 98%    Physical Exam Vitals and nursing note reviewed.  Constitutional:      Appearance: She is well-developed.  HENT:     Head: Normocephalic and atraumatic.     Right Ear: External ear normal.     Left Ear: External ear normal.     Nose: No congestion or rhinorrhea.  Eyes:     Conjunctiva/sclera: Conjunctivae normal.     Pupils: Pupils are equal, round, and reactive to light.  Neck:     Trachea: Phonation normal.  Cardiovascular:     Rate and Rhythm: Normal rate and regular rhythm.     Heart sounds: Normal heart sounds.  Pulmonary:     Effort: Pulmonary effort is normal.     Breath sounds: Normal breath sounds.  Chest:     Chest wall: No tenderness.  Abdominal:     General: There is no distension.     Palpations: Abdomen is soft.     Tenderness: There is no abdominal tenderness.  Musculoskeletal:     Cervical back: Normal range of motion and neck supple.     Comments: Tender right hip without deformity.  She guards against movement of the right hip secondary to pain.  Skin:    General: Skin is warm and dry.  Neurological:     Mental Status: She is alert and oriented to person, place, and time.     Cranial Nerves: No cranial nerve deficit.     Sensory: No sensory deficit.     Motor: No abnormal muscle tone.     Coordination: Coordination normal.  Psychiatric:        Mood and Affect: Mood normal.        Behavior: Behavior normal.        Thought Content: Thought content normal.        Judgment: Judgment normal.     ED Results / Procedures / Treatments   Labs (all labs ordered are listed, but only abnormal results are displayed) Labs Reviewed  CBC WITH DIFFERENTIAL/PLATELET - Abnormal; Notable for the following components:      Result Value   WBC 10.7 (*)    RBC 3.12 (*)    Hemoglobin 10.5 (*)    HCT 32.2 (*)    MCV 103.2 (*)    Neutro Abs 8.9 (*)    All other components within normal limits  RESP PANEL BY RT-PCR (FLU A&B, COVID) [id number]  BASIC METABOLIC PANEL   PROTIME-INR  TYPE AND SCREEN    EKG EKG Interpretation  Date/Time:  Saturday Oct 17 2020 07:53:51 EDT Ventricular Rate:  86 PR Interval:  209 QRS Duration: 103 QT Interval:  398 QTC  Calculation: 476 R Axis:   37 Text Interpretation: Sinus rhythm Borderline low voltage, extremity leads since last tracing no significant change Confirmed by Jen Benedict [phone]([id number]) on 10/17/2020 9:40:00 AM   Radiology DG Hip Unilat With PeMancel Balelvis 2-3 Views Right  Result Date: 10/17/2020 CLINICAL DATA:  Pain after fall EXAM: DG HIP (WITH OR WITHOUT PELVIS) 2-3V RIGHT COMPARISON:  None. FINDINGS: There is a nondisplaced fracture through the right femoral neck without femoral head dislocation. There also appear to be 2 nondisplaced fractures through the medial right inferior pubic ramus, apparently extending into the pubic symphysis. No other acute abnormalities are identified. IMPRESSION: 1. Nondisplaced fracture through the right femoral neck. 2. 2 nondisplaced fractures through the medial right inferior pubic ramus. One of the fracture lines extends into the pubic symphysis. Electronically Signed   By: Gerome Samavid  Williams III M.D   On: 10/17/2020 09:19    Procedures Procedures   Medications Ordered in ED Medications  0.9 %  sodium chloride infusion ( Intravenous New Bag/Given 10/17/20 0953)  fentaNYL (SUBLIMAZE) injection 50 mcg (50 mcg Intravenous Given 10/17/20 0953)  ondansetron (ZOFRAN) injection 4 mg (4 mg Intravenous Given 10/17/20 [id number])    ED Course  I have reviewed the triage vital signs and the nursing notes.  Pertinent labs & imaging results that were available during my care of the patient were reviewed by me and considered in my medical decision making (see chart for details).  Clinical Course as of 10/17/20 1026  Sat Oct 17, 2020  [id number] Case discussed with orthopedics, Dr. 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  He will see the patient and decide the course of treatment.  Will page  hospitalist for admission at this time. [EW]    Clinical Course User Index [EW] Mancel BaleWentz, Tykwon Fera, MD   MDM Rules/Calculators/A&P                           Patient Vitals for the past 24 hrs:  BP Temp Temp src Pulse Resp SpO2  10/17/20 1000 (!) 164/50 -- -- 81 19 98 %  10/17/20 0921 (!) 149/49 -- -- 89 20 96 %  10/17/20 0815 (!) 148/55 -- -- 86 (!) 22 95 %  10/17/20 0744 (!) 187/61 98.1 F (36.7 C) Oral 89 18 95 %    10:26 AM Reevaluation with update and discussion. After initial assessment and treatment, an updated evaluation reveals no change in status, findings discussed with patient, and granddaughter, all questions and. Mancel BaleElliott Tayana Shankle   Medical Decision Making:  This patient is presenting for evaluation of fall with subsequent right hip pain, which does require a range of treatment options, and is a complaint that involves a moderate risk of morbidity and mortality. The differential diagnoses include contusion, fracture, nonspecific medical illness. I decided to review old records, and in summary elderly female, who does not ambulate, because her chronic disability, rolled out of bed last night injuring her right hip.  I did not require additional historical information from anyone.  Clinical Laboratory Tests Ordered, included CBC, Metabolic panel and Viral panel. Review indicates normal except hemoglobin low, [place] currently pending; 10:46 AM. Radiologic Tests Ordered, included x-ray right hip and pelvis.  I independently Visualized: Radiograph images, which show right hip intertrochanteric fracture, and right-sided pelvic rami fractures   Critical Interventions-clinical evaluation, laboratory testing, observation, medication treatment, consultation with orthopedics and hospitalist  After These Interventions, the Patient was reevaluated and was found with right  hip and pelvic fractures.  She will require operative management.  Will admit to hospitalist, with orthopedic  consultation.  Anticipate clearance by hospitalist, with timing for surgical management depending on that.  CRITICAL CARE-no Performed by: Mancel Bale  Nursing Notes Reviewed/ Care Coordinated Applicable Imaging Reviewed Interpretation of Laboratory Data incorporated into ED treatment   10:30 AM-Consult complete with hospitalist. Patient case explained and discussed.  He agrees to admit patient for further evaluation and treatment. Call ended at 10:45 AM    Final Clinical Impression(s) / ED Diagnoses Final diagnoses:  None    Rx / DC Orders ED Discharge Orders    None       Mancel Bale, MD 10/17/20 1046

## 2020-10-17 NOTE — Progress Notes (Signed)
Initial Nutrition Assessment  DOCUMENTATION CODES:   Not applicable  INTERVENTION:   -Obtain current wt -Once diet is advanced, add:   -Ensure Enlive po BID, each supplement provides 350 kcal and 20 grams of protein -MVI with minerals daily  NUTRITION DIAGNOSIS:   Increased nutrient needs related to post-op healing as evidenced by estimated needs.  GOAL:   Patient will meet greater than or equal to 90% of their needs  MONITOR:   PO intake,Diet advancement,Labs,Weight trends,Supplement acceptance,Skin,I & O's  REASON FOR ASSESSMENT:   Consult Assessment of nutrition requirement/status,Hip fracture protocol  ASSESSMENT:   This a very pleasant 87 year old female with past medical history of hypertension, hyperlipidemia, fibromyalgia, recurrent falls, DDD from [facility] who presented to the ED after accidentally falling out of bed last night with right hip pain.  She was helped back into bed but had persistent right hip pain so came for evaluation.  She did not lose consciousness. She had a dream last night that she needed to get out of bed, swung her legs around to the side of the bed, and fell. Says that she has not walked for the past year after she had a fall with several rib fractures and only uses a wheelchair to get around. No chest pain, shortness of breath, leg swelling or any other complaints.  Pt admitted with rt femoral neck fracture and 2 pubic fractures s/p fall from bed.   Per orthopedics notes, plan for pinning today. Pt currently NPO for procedure.   Pt unavailable at time of visit. Attempted to speak with pt via call to hospital room phone, however, unable to reach. RD unable to obtain further nutrition-related history or complete nutrition-focused physical exam at this time.   Reviewed wt hx; no recent wt has been obtained. Noted distant history of wt loss last year. Will obtain new wt to better assess weight changes.   Pt with increased nutrient needs to  support post-operative healing and would benefit from addition of oral nutrition supplements.  Medications reviewed and include 0.9% sodium chloride infusion @ 125 ml/hr.   Labs reviewed.   Diet Order:   Diet Order            Diet NPO time specified  Diet effective now                 EDUCATION NEEDS:   No education needs have been identified at this time  Skin:  Skin Assessment: Reviewed RN Assessment  Last BM:  Unknown  Height:   Ht Readings from Last 1 Encounters:  11/04/19 5\' 2"  (1.575 m)    Weight:   Wt Readings from Last 1 Encounters:  11/04/19 42.5 kg    Ideal Body Weight:  50 kg  BMI:  There is no height or weight on file to calculate BMI.  Estimated Nutritional Needs:   Kcal:  1500-1700  Protein:  65-80 grams  Fluid:  > 1.5 L    01/04/20, RD, LDN, CDCES Registered Dietitian II Certified Diabetes Care and Education Specialist Please refer to [facility] for RD and/or RD on-call/weekend/after hours pager

## 2020-10-17 NOTE — Anesthesia Procedure Notes (Signed)
Procedure Name: Intubation Performed by: Gean Maidens, CRNA Pre-anesthesia Checklist: Patient identified, Emergency Drugs available, Suction available, Patient being monitored and Timeout performed Patient Re-evaluated:Patient Re-evaluated prior to induction Oxygen Delivery Method: Circle system utilized Preoxygenation: Pre-oxygenation with 100% oxygen Induction Type: IV induction Ventilation: Mask ventilation without difficulty Laryngoscope Size: Mac and 3 Grade View: Grade I Tube type: Oral Tube size: 7.0 mm Number of attempts: 1 Airway Equipment and Method: Stylet Placement Confirmation: ETT inserted through vocal cords under direct vision,  positive ETCO2 and breath sounds checked- equal and bilateral Secured at: 21 cm Tube secured with: Tape Dental Injury: Teeth and Oropharynx as per pre-operative assessment

## 2020-10-17 NOTE — ED Triage Notes (Signed)
Transported by [employer] from [place]. Experienced a fall this morning injuring right hip. No obvious deformity/shortening. No loss of consciousness. Upon arrival, patient reported dizziness and had one episode of hematemesis. VSS with EMS.   BP 158/70 HR 79 SP O2 98% CBG 125 mg/dl Temperature 97.7

## 2020-10-18 DIAGNOSIS — S72001A Fracture of unspecified part of neck of right femur, initial encounter for closed fracture: Secondary | ICD-10-CM | POA: Diagnosis not present

## 2020-10-18 LAB — CBC WITH DIFFERENTIAL/PLATELET
Abs Immature Granulocytes: 0.05 10*3/uL (ref 0.00–0.07)
Basophils Absolute: 0 10*3/uL (ref 0.0–0.1)
Basophils Relative: 0 %
Eosinophils Absolute: 0 10*3/uL (ref 0.0–0.5)
Eosinophils Relative: 0 %
HCT: 24.2 % — ABNORMAL LOW (ref 36.0–46.0)
Hemoglobin: 7.4 g/dL — ABNORMAL LOW (ref 12.0–15.0)
Immature Granulocytes: 1 %
Lymphocytes Relative: 11 %
Lymphs Abs: 1 10*3/uL (ref 0.7–4.0)
MCH: 33.5 pg (ref 26.0–34.0)
MCHC: 30.6 g/dL (ref 30.0–36.0)
MCV: 109.5 fL — ABNORMAL HIGH (ref 80.0–100.0)
Monocytes Absolute: 0.8 10*3/uL (ref 0.1–1.0)
Monocytes Relative: 9 %
Neutro Abs: 7 10*3/uL (ref 1.7–7.7)
Neutrophils Relative %: 79 %
Platelets: 174 10*3/uL (ref 150–400)
RBC: 2.21 MIL/uL — ABNORMAL LOW (ref 3.87–5.11)
RDW: 14.4 % (ref 11.5–15.5)
WBC: 8.8 10*3/uL (ref 4.0–10.5)
nRBC: 0 % (ref 0.0–0.2)

## 2020-10-18 LAB — BASIC METABOLIC PANEL
Anion gap: 6 (ref 5–15)
BUN: 53 mg/dL — ABNORMAL HIGH (ref 8–23)
CO2: 26 mmol/L (ref 22–32)
Calcium: 7.8 mg/dL — ABNORMAL LOW (ref 8.9–10.3)
Chloride: 110 mmol/L (ref 98–111)
Creatinine, Ser: 1.26 mg/dL — ABNORMAL HIGH (ref 0.44–1.00)
GFR, Estimated: 39 mL/min — ABNORMAL LOW (ref >60)
Glucose, Bld: 122 mg/dL — ABNORMAL HIGH (ref 70–99)
Potassium: 4.7 mmol/L (ref 3.5–5.1)
Sodium: 142 mmol/L (ref 135–145)

## 2020-10-18 LAB — PREPARE RBC (CROSSMATCH)

## 2020-10-18 LAB — OCCULT BLOOD X 1 CARD TO LAB, STOOL: Fecal Occult Bld: POSITIVE — AB

## 2020-10-18 MED ORDER — SODIUM CHLORIDE 0.9% IV SOLUTION: Freq: Once | INTRAVENOUS | Status: AC

## 2020-10-18 MED ORDER — ENOXAPARIN SODIUM 30 MG/0.3ML IJ SOSY
30.0000 mg | PREFILLED_SYRINGE | Freq: Every day | INTRAMUSCULAR | Status: DC
  Administered 2020-10-18: 30 mg via SUBCUTANEOUS
  Filled 2020-10-18: qty 0.3

## 2020-10-18 MED ORDER — POLYETHYLENE GLYCOL 3350 17 G PO PACK
17.0000 g | PACK | Freq: Every day | ORAL | Status: DC
  Administered 2020-10-18: 17 g via ORAL
  Filled 2020-10-18: qty 1

## 2020-10-18 MED ORDER — HYDROCODONE-ACETAMINOPHEN 5-325 MG PO TABS
1.0000 | ORAL_TABLET | Freq: Four times a day (QID) | ORAL | Status: DC | PRN
  Administered 2020-10-18 – 2020-10-26 (×2): 1 via ORAL
  Filled 2020-10-18 (×2): qty 1

## 2020-10-18 MED ORDER — ACETAMINOPHEN 500 MG PO TABS
500.0000 mg | ORAL_TABLET | Freq: Four times a day (QID) | ORAL | Status: AC
  Administered 2020-10-18 – 2020-10-19 (×4): 500 mg via ORAL
  Filled 2020-10-18 (×4): qty 1

## 2020-10-18 MED ORDER — HYDROCODONE-ACETAMINOPHEN 7.5-325 MG PO TABS
1.0000 | ORAL_TABLET | Freq: Four times a day (QID) | ORAL | Status: DC | PRN
  Administered 2020-10-20 – 2020-10-21 (×3): 1 via ORAL
  Filled 2020-10-18 (×3): qty 1

## 2020-10-18 NOTE — Progress Notes (Signed)
     Karen Mendez is a 87 y.o. female   Orthopaedic diagnosis: Postop day 1 status post percutaneous screw fixation of right femoral neck fracture, nonoperative treatment of pelvic rami fracture  Subjective: Patient complains of pain in the right hip.  She states she is having muscle spasms.  It does not hurt much if she is laying still.  She has not been out of bed with therapy.  As you recall she is relatively nonambulatory.  Objectyive: Vitals:   10/18/20 0126 10/18/20 0501  BP: 128/61 (!) 127/46  Pulse: 75 79  Resp: 16 16  Temp: 98.1 F (36.7 C) 98.2 F (36.8 C)  SpO2: 99% 99%     Exam: Awake and alert Respirations even and unlabored No acute distress  Dressing in place with no shadowing.  Hip in a normal resting position.  She tolerates gentle passive motion of the hip.  Assessment: POD #1 right hip cannulated screw fixation Nonoperative treatment of pelvic rami fractures   Plan: She will mobilize with physical therapy.  She is weightbearing as tolerated for transfers only. She will discharge on full-strength aspirin for 1 month postoperatively Limit narcotic medicine due to geriatric status. She will follow-up in 2 weeks for x-rays of the right hip and wound check.   Nicki Guadalajara, MD

## 2020-10-18 NOTE — Evaluation (Signed)
Physical Therapy Evaluation Patient Details Name: Karen Mendez MRN: [id number] DOB: 11-Feb-1923 Today's Date: 10/18/2020   History of Present Illness  Pt s/p fall from bed at [facility]F and with R femoral neck fx and nondisplaced medial R and inferior pubic ramus fx.  Pt now s/p percutaneous screw fixation of femoral neck.  Pt with hx of Fibromyalgia macular degeneration, DDD and multiple falls.  Clinical Impression  Pt admitted as above and presenting with functional mobility limitations 2* generalized weakness; decreased strength/ROM and WB tolerance on R LE, pain, and balance deficits.  At baseline, pt was nonambulatory and transferring bed<>chair with assist.  Pt is hopeful to return to prior ALF if they can provide assist level currently needed.  If not, pt will require follow up rehab at SNF level.    Follow Up Recommendations SNF (Pt hopes to return to prior ALF)    Equipment Recommendations  None recommended by PT    Recommendations for Other Services       Precautions / Restrictions Precautions Precautions: Fall Restrictions Weight Bearing Restrictions: No Other Position/Activity Restrictions: WBAT      Mobility  Bed Mobility Overal bed mobility: Needs Assistance Bed Mobility: Supine to Sit     Supine to sit: Mod assist;+2 for safety/equipment     General bed mobility comments: Increased time with cues for sequence and physical assist to manage R LE and to bring trunk to upright    Transfers Overall transfer level: Needs assistance   Transfers: Sit to/from Stand;Stand Pivot Transfers Sit to Stand: +2 physical assistance;Max assist Stand pivot transfers: Max assist;+2 physical assistance          Ambulation/Gait             General Gait Details: Pt nonambulatory prior to fall  Stairs            Wheelchair Mobility    Modified Rankin (Stroke Patients Only)       Balance Overall balance assessment: Needs assistance Sitting-balance  support: No upper extremity supported;Feet supported Sitting balance-Leahy Scale: Fair                                       Pertinent Vitals/Pain Pain Assessment: Faces Faces Pain Scale: Hurts little more Pain Location: R hip with activity Pain Descriptors / Indicators: Aching;Sore Pain Intervention(s): Limited activity within patient's tolerance;Monitored during session;Premedicated before session    Home Living Family/patient expects to be discharged to:: Assisted living               Home Equipment: Walker - 4 wheels;Cane - single point;Shower seat;Grab bars - toilet;Grab bars - tub/shower      Prior Function Level of Independence: Needs assistance   Gait / Transfers Assistance Needed: Pt states nonambulatory for year+ and only transfers bed to wc           Hand Dominance   Dominant Hand: Right    Extremity/Trunk Assessment   Upper Extremity Assessment Upper Extremity Assessment: Generalized weakness    Lower Extremity Assessment Lower Extremity Assessment: Generalized weakness;RLE deficits/detail    Cervical / Trunk Assessment Cervical / Trunk Assessment: Kyphotic  Communication   Communication: No difficulties  Cognition Arousal/Alertness: Awake/alert Behavior During Therapy: WFL for tasks assessed/performed Overall Cognitive Status: Within Functional Limits for tasks assessed  General Comments      Exercises General Exercises - Lower Extremity Ankle Circles/Pumps: AROM;Both;15 reps;Supine Heel Slides: AAROM;Right;15 reps;Supine Hip ABduction/ADduction: AAROM;Right;15 reps;Supine   Assessment/Plan    PT Assessment Patient needs continued PT services  PT Problem List Decreased strength;Decreased range of motion;Decreased activity tolerance;Decreased balance;Decreased mobility;Decreased knowledge of use of DME;Pain       PT Treatment Interventions DME  instruction;Functional mobility training;Therapeutic activities;Therapeutic exercise;Balance training;Patient/family education    PT Goals (Current goals can be found in the Care Plan section)  Acute Rehab PT Goals Patient Stated Goal: Return to [facility] PT Goal Formulation: With patient Time For Goal Achievement: 10/25/20 Potential to Achieve Goals: Good    Frequency Min 2X/week   Barriers to discharge        Co-evaluation               AM-PAC PT "6 Clicks" Mobility  Outcome Measure Help needed turning from your back to your side while in a flat bed without using bedrails?: A Lot Help needed moving from lying on your back to sitting on the side of a flat bed without using bedrails?: A Lot Help needed moving to and from a bed to a chair (including a wheelchair)?: A Lot Help needed standing up from a chair using your arms (e.g., wheelchair or bedside chair)?: Total Help needed to walk in hospital room?: Total Help needed climbing 3-5 steps with a railing? : Total 6 Click Score: 9    End of Session Equipment Utilized During Treatment: Gait belt Activity Tolerance: Patient tolerated treatment well Patient left: in chair;with call bell/phone within reach;with chair alarm set;with nursing/sitter in room Nurse Communication: Mobility status PT Visit Diagnosis: Difficulty in walking, not elsewhere classified (R26.2);Muscle weakness (generalized) (M62.81);Pain Pain - Right/Left: Right Pain - part of body: Hip    Time: 9323-5573 PT Time Calculation (min) (ACUTE ONLY): 26 min   Charges:   PT Evaluation $PT Eval Low Complexity: 1 Low PT Treatments $Therapeutic Exercise: 8-22 mins        Karen Mendez PT Acute Rehabilitation Services Pager [phone] Office [phone]   Jachai Okazaki 10/18/2020, 6:01 PM

## 2020-10-18 NOTE — Plan of Care (Signed)
  Problem: Clinical Measurements: Goal: Postoperative complications will be avoided or minimized Outcome: Progressing   Problem: Pain Management: Goal: Pain level will decrease Outcome: Progressing   

## 2020-10-18 NOTE — Progress Notes (Addendum)
PROGRESS NOTE    Karen Mendez  EHM:[id number] DOB: 07/10/1922 DOA: 10/17/2020 PCP: [name], MD   Brief Narrative: 87 year old female lives at [place] status post mechanical fall and right hip fracture.  She is now status post repair.  Her past medical history includes hypertension fibromyalgia recurrent falls hyperlipidemia and degenerative joint disease.  Patient reports that she has not walked much for the past year after she had a fall with several rib fractures and uses wheelchair to get around.  Assessment & Plan:   Principal Problem:   Hip fracture (HCC) Active Problems:   Essential hypertension   Mixed hyperlipidemia   Fall   #1 nondisplaced right femoral neck fracture and nondisplaced fractures to the medial right inferior pubic ramus status post mechanical fall.  She is status post close reduction and percutaneous screw fixation of the right femoral neck fracture. Ortho has recommended weightbearing as tolerated to right lower extremities for transfers only.  DVT prophylaxis with lovenox Limit narcotics Tylenol 3 times a day Bowel regime Follow-up on discharge with Ortho for wound check and suture removal and x-rays. PT evaluation pending.  #2 history of chronic diastolic heart failure patient is more on the dry side today.  Holding home Lasix.  Slow IV hydration.  #3 hypertension continue beta-blocker today is  #4 hyperlipidemia not on statins prior to admit  #5 ABLA-globin is 7.4 from 10.5 partly could be dilutional.  However we will transfuse her 1 unit of packed RBC.  #6 AKI creatinine 1.26 from 0.9.  Continue slow IV fluids and continue to hold Lasix.    Nutrition Problem: Increased nutrient needs Etiology: post-op healing   Signs/Symptoms: estimated needs    Interventions: Ensure Enlive (each supplement provides 350kcal and 20 grams of protein),MVI  Estimated body mass index is 17.14 kg/m as calculated from the following:   Height as of  11/04/19: 5\' 2"  (1.575 m).   Weight as of 11/04/19: 42.5 kg.  DVT prophylaxis: Lovenox  code Status: DNR  family Communication: dw daughter  Disposition Plan:  Status is: Inpatient  Dispo: The patient is from: Skilled nursing facility              Anticipated d/c is to: SNF              Patient currently is not medically stable to d/c.   Difficult to place patient No  Consultants:   Ortho  Procedures: Right hip surgery 10/17/2020 Antimicrobials: None  Subjective: She is resting in bed reports that she is shivering and cold pain seems to be controlled while she is resting  Objective: Vitals:   10/17/20 1836 10/17/20 2159 10/18/20 0126 10/18/20 0501  BP: (!) 148/48 (!) 132/100 128/61 (!) 127/46  Pulse: 79 85 75 79  Resp: 16 16 16 16   Temp: 98.7 F (37.1 C) 98.8 F (37.1 C) 98.1 F (36.7 C) 98.2 F (36.8 C)  TempSrc: Oral Oral Oral Oral  SpO2: 100% 99% 99% 99%    Intake/Output Summary (Last 24 hours) at 10/18/2020 1037 Last data filed at 10/18/2020 10/20/2020 Gross per 24 hour  Intake 2798.48 ml  Output 800 ml  Net 1998.48 ml   There were no vitals filed for this visit.  Examination:  General exam: Appears calm and comfortable  Respiratory system: Clear to auscultation. Respiratory effort normal. Cardiovascular system: S1 & S2 heard, RRR. No JVD, murmurs, rubs, gallops or clicks. No pedal edema. Gastrointestinal system: Abdomen is nondistended, soft and nontender. No organomegaly or  masses felt. Normal bowel sounds heard. Central nervous system: Alert and oriented. No focal neurological deficits. Extremities: Right hip covered with dressings clean dry intact Skin: No rashes, lesions or ulcers Psychiatry: Judgement and insight appear normal. Mood & affect appropriate.     Data Reviewed: I have personally reviewed following labs and imaging studies  CBC: Recent Labs  Lab 10/17/20 0955 10/18/20 0738  WBC 10.7* 8.8  NEUTROABS 8.9* 7.0  HGB 10.5* 7.4*  HCT 32.2* 24.2*   MCV 103.2* 109.5*  PLT 190 174   Basic Metabolic Panel: Recent Labs  Lab 10/17/20 0955 10/18/20 0322  NA 141 142  K 4.5 4.7  CL 106 110  CO2 25 26  GLUCOSE 111* 122*  BUN 41* 53*  CREATININE 0.90 1.26*  CALCIUM 8.8* 7.8*   GFR: CrCl cannot be calculated (Unknown ideal weight.). Liver Function Tests: No results for input(s): AST, ALT, ALKPHOS, BILITOT, PROT, ALBUMIN in the last 168 hours. No results for input(s): LIPASE, AMYLASE in the last 168 hours. No results for input(s): AMMONIA in the last 168 hours. Coagulation Profile: Recent Labs  Lab 10/17/20 0955  INR 1.1   Cardiac Enzymes: No results for input(s): CKTOTAL, CKMB, CKMBINDEX, TROPONINI in the last 168 hours. BNP (last 3 results) No results for input(s): PROBNP in the last 8760 hours. HbA1C: No results for input(s): HGBA1C in the last 72 hours. CBG: No results for input(s): GLUCAP in the last 168 hours. Lipid Profile: No results for input(s): CHOL, HDL, LDLCALC, TRIG, CHOLHDL, LDLDIRECT in the last 72 hours. Thyroid Function Tests: No results for input(s): TSH, T4TOTAL, FREET4, T3FREE, THYROIDAB in the last 72 hours. Anemia Panel: No results for input(s): VITAMINB12, FOLATE, FERRITIN, TIBC, IRON, RETICCTPCT in the last 72 hours. Sepsis Labs: No results for input(s): PROCALCITON, LATICACIDVEN in the last 168 hours.  Recent Results (from the past 240 hour(s))  Resp Panel by RT-PCR (Flu A&B, Covid) Nasopharyngeal Swab     Status: None   Collection Time: 10/17/20  9:55 AM   Specimen: Nasopharyngeal Swab; Nasopharyngeal(NP) swabs in vial transport medium  Result Value Ref Range Status   SARS Coronavirus 2 by RT PCR NEGATIVE NEGATIVE Final    Comment: (NOTE) SARS-CoV-2 target nucleic acids are NOT DETECTED.  The SARS-CoV-2 RNA is generally detectable in upper respiratory specimens during the acute phase of infection. The lowest concentration of SARS-CoV-2 viral copies this assay can detect is 138  copies/mL. A negative result does not preclude SARS-Cov-2 infection and should not be used as the sole basis for treatment or other patient management decisions. A negative result may occur with  improper specimen collection/handling, submission of specimen other than nasopharyngeal swab, presence of viral mutation(s) within the areas targeted by this assay, and inadequate number of viral copies(<138 copies/mL). A negative result must be combined with clinical observations, patient history, and epidemiological information. The expected result is Negative.  Fact Sheet for Patients:  BloggerCourse.com  Fact Sheet for Healthcare Providers:  SeriousBroker.it  This test is no t yet approved or cleared by the [place] FDA and  has been authorized for detection and/or diagnosis of SARS-CoV-2 by FDA under an Emergency Use Authorization (EUA). This EUA will remain  in effect (meaning this test can be used) for the duration of the COVID-19 declaration under Section 564(b)(1) of the Act, 21 U.S.C.section 360bbb-3(b)(1), unless the authorization is terminated  or revoked sooner.       Influenza A by PCR NEGATIVE NEGATIVE Final   Influenza B by  PCR NEGATIVE NEGATIVE Final    Comment: (NOTE) The Xpert Xpress SARS-CoV-2/FLU/RSV plus assay is intended as an aid in the diagnosis of influenza from Nasopharyngeal swab specimens and should not be used as a sole basis for treatment. Nasal washings and aspirates are unacceptable for Xpert Xpress SARS-CoV-2/FLU/RSV testing.  Fact Sheet for Patients: BloggerCourse.com  Fact Sheet for Healthcare Providers: SeriousBroker.it  This test is not yet approved or cleared by the [place] FDA and has been authorized for detection and/or diagnosis of SARS-CoV-2 by FDA under an Emergency Use Authorization (EUA). This EUA will remain in effect (meaning  this test can be used) for the duration of the COVID-19 declaration under Section 564(b)(1) of the Act, 21 U.S.C. section 360bbb-3(b)(1), unless the authorization is terminated or revoked.  Performed at [facility], [address] [place], [place] [address]          Radiology Studies: DG C-Arm 1-60 Min-No Report  Result Date: 10/17/2020 Fluoroscopy was utilized by the requesting physician.  No radiographic interpretation.   DG HIP OPERATIVE UNILAT W OR W/O PELVIS RIGHT  Result Date: 10/17/2020 CLINICAL DATA:  Right hip fracture repair. FLUOROSCOPY TIME:  46 seconds. EXAM: OPERATIVE RIGHT HIP (WITH PELVIS IF PERFORMED) 2 VIEWS TECHNIQUE: Fluoroscopic spot image(s) were submitted for interpretation post-operatively. COMPARISON:  None. FINDINGS: Three nails have been placed across the right femoral neck fracture. IMPRESSION: Three nails have been placed across the right femoral neck fracture. Electronically Signed   By: Gerome Sam III M.D   On: 10/17/2020 15:03   DG Hip Unilat With Pelvis 2-3 Views Right  Result Date: 10/17/2020 CLINICAL DATA:  Pain after fall EXAM: DG HIP (WITH OR WITHOUT PELVIS) 2-3V RIGHT COMPARISON:  None. FINDINGS: There is a nondisplaced fracture through the right femoral neck without femoral head dislocation. There also appear to be 2 nondisplaced fractures through the medial right inferior pubic ramus, apparently extending into the pubic symphysis. No other acute abnormalities are identified. IMPRESSION: 1. Nondisplaced fracture through the right femoral neck. 2. 2 nondisplaced fractures through the medial right inferior pubic ramus. One of the fracture lines extends into the pubic symphysis. Electronically Signed   By: Gerome Sam III M.D   On: 10/17/2020 09:19        Scheduled Meds: . acetaminophen  500 mg Oral Q6H  . aspirin EC  325 mg Oral Q breakfast  . carvedilol  3.125 mg Oral BID WC  . docusate sodium  100 mg Oral BID  .  feeding supplement  237 mL Oral BID BM  . multivitamin with minerals  1 tablet Oral Daily  . sertraline  75 mg Oral Daily   Continuous Infusions: . sodium chloride 75 mL/hr at 10/18/20 0828     LOS: 1 day   Alwyn Ren, MD  10/18/2020, 10:37 AM

## 2020-10-18 NOTE — Plan of Care (Signed)
  Problem: Pain Management: Goal: Pain level will decrease Outcome: Progressing   

## 2020-10-19 ENCOUNTER — Encounter (HOSPITAL_COMMUNITY): Payer: Self-pay | Admitting: Orthopaedic Surgery

## 2020-10-19 ENCOUNTER — Inpatient Hospital Stay (HOSPITAL_COMMUNITY): Payer: Medicare HMO

## 2020-10-19 DIAGNOSIS — S72001A Fracture of unspecified part of neck of right femur, initial encounter for closed fracture: Secondary | ICD-10-CM | POA: Diagnosis not present

## 2020-10-19 LAB — PREPARE RBC (CROSSMATCH)

## 2020-10-19 LAB — BASIC METABOLIC PANEL
Anion gap: 2 — ABNORMAL LOW (ref 5–15)
BUN: 51 mg/dL — ABNORMAL HIGH (ref 8–23)
CO2: 25 mmol/L (ref 22–32)
Calcium: 7.4 mg/dL — ABNORMAL LOW (ref 8.9–10.3)
Chloride: 115 mmol/L — ABNORMAL HIGH (ref 98–111)
Creatinine, Ser: 1.1 mg/dL — ABNORMAL HIGH (ref 0.44–1.00)
GFR, Estimated: 46 mL/min — ABNORMAL LOW (ref >60)
Glucose, Bld: 122 mg/dL — ABNORMAL HIGH (ref 70–99)
Potassium: 4.4 mmol/L (ref 3.5–5.1)
Sodium: 142 mmol/L (ref 135–145)

## 2020-10-19 LAB — CBC
HCT: 18 % — ABNORMAL LOW (ref 36.0–46.0)
Hemoglobin: 5.8 g/dL — CL (ref 12.0–15.0)
MCH: 33 pg (ref 26.0–34.0)
MCHC: 32.2 g/dL (ref 30.0–36.0)
MCV: 102.3 fL — ABNORMAL HIGH (ref 80.0–100.0)
Platelets: 178 10*3/uL (ref 150–400)
RBC: 1.76 MIL/uL — ABNORMAL LOW (ref 3.87–5.11)
RDW: 17.9 % — ABNORMAL HIGH (ref 11.5–15.5)
WBC: 9.2 10*3/uL (ref 4.0–10.5)
nRBC: 0 % (ref 0.0–0.2)

## 2020-10-19 LAB — HEMOGLOBIN AND HEMATOCRIT, BLOOD
HCT: 23.6 % — ABNORMAL LOW (ref 36.0–46.0)
Hemoglobin: 7.9 g/dL — ABNORMAL LOW (ref 12.0–15.0)

## 2020-10-19 LAB — OCCULT BLOOD X 1 CARD TO LAB, STOOL: Fecal Occult Bld: POSITIVE — AB

## 2020-10-19 LAB — MRSA PCR SCREENING: MRSA by PCR: NEGATIVE

## 2020-10-19 MED ORDER — SODIUM CHLORIDE 0.9 % IV SOLN
8.0000 mg/h | INTRAVENOUS | Status: AC
  Administered 2020-10-19 – 2020-10-22 (×8): 8 mg/h via INTRAVENOUS
  Filled 2020-10-19 (×9): qty 80

## 2020-10-19 MED ORDER — PANTOPRAZOLE SODIUM 40 MG IV SOLR: 40.0000 mg | Freq: Two times a day (BID) | INTRAVENOUS | Status: DC

## 2020-10-19 MED ORDER — CHLORHEXIDINE GLUCONATE 0.12 % MT SOLN
15.0000 mL | Freq: Two times a day (BID) | OROMUCOSAL | Status: DC
  Administered 2020-10-19 – 2020-10-27 (×12): 15 mL via OROMUCOSAL
  Filled 2020-10-19 (×13): qty 15

## 2020-10-19 MED ORDER — ORAL CARE MOUTH RINSE
15.0000 mL | Freq: Two times a day (BID) | OROMUCOSAL | Status: DC
  Administered 2020-10-19: 15 mL via OROMUCOSAL

## 2020-10-19 MED ORDER — ORAL CARE MOUTH RINSE
15.0000 mL | Freq: Two times a day (BID) | OROMUCOSAL | Status: DC
  Administered 2020-10-19 – 2020-10-25 (×9): 15 mL via OROMUCOSAL

## 2020-10-19 MED ORDER — SODIUM CHLORIDE 0.9 % IV SOLN
80.0000 mg | Freq: Once | INTRAVENOUS | Status: AC
  Administered 2020-10-19: 80 mg via INTRAVENOUS
  Filled 2020-10-19: qty 80

## 2020-10-19 MED ORDER — TECHNETIUM TC 99M-LABELED RED BLOOD CELLS IV KIT
23.6000 | PACK | Freq: Once | INTRAVENOUS | Status: AC
  Administered 2020-10-19: 23.6 via INTRAVENOUS

## 2020-10-19 MED ORDER — CYANOCOBALAMIN 1000 MCG/ML IJ SOLN
1000.0000 ug | Freq: Once | INTRAMUSCULAR | Status: AC
  Administered 2020-10-19: 1000 ug via INTRAMUSCULAR
  Filled 2020-10-19: qty 1

## 2020-10-19 MED ORDER — SODIUM CHLORIDE 0.9% IV SOLUTION: Freq: Once | INTRAVENOUS | Status: AC

## 2020-10-19 MED ORDER — CHLORHEXIDINE GLUCONATE CLOTH 2 % EX PADS
6.0000 | MEDICATED_PAD | Freq: Every day | CUTANEOUS | Status: DC
  Administered 2020-10-19 – 2020-10-27 (×8): 6 via TOPICAL

## 2020-10-19 MED ORDER — SODIUM CHLORIDE 0.9 % IV SOLN
510.0000 mg | Freq: Once | INTRAVENOUS | Status: AC
  Administered 2020-10-19: 510 mg via INTRAVENOUS
  Filled 2020-10-19: qty 510

## 2020-10-19 NOTE — TOC Initial Note (Signed)
Transition of Care [facility]) - Initial/Assessment Note   Patient Details  Name: Karen Mendez MRN: [id number] Date of Birth: 01-16-1923  Transition of [facility]) CM/SW Contact:    Ewing Schlein, LCSW Phone Number: 10/19/2020, 10:17 AM  Clinical Narrative: Patient is a 87 year old female who was admitted for a hip fracture. PT evaluation recommended SNF. CSW spoke with patient and her daughter, Wonda Cheng, to discuss recommendations. Both are agreeable to a SNF referral with the expectation patient will return to [facility] ALF after rehab.  CSW called Chip Boer and confirmed patient will need SNF before returning due to the hip fracture. Copy of COVID vaccines was faxed to [facility].  FL2 completed; PASRR verified. Initial referral faxed out. TOC awaiting bed offers.  Expected Discharge Plan: Skilled Nursing Facility Barriers to Discharge: Continued Medical Work up,SNF Pending bed offer  Patient Goals and CMS Choice Patient states their goals for this hospitalization and ongoing recovery are:: Go to rehab and then return to [facility] ALF CMS Medicare.gov Compare Post Acute Care list provided to:: Patient Choice offered to / list presented to : Patient,Adult Children  Expected Discharge Plan and Services Expected Discharge Plan: Skilled Nursing Facility In-house Referral: Clinical Social Work Post Acute Care Choice: Skilled Nursing Facility Living arrangements for the past 2 months: Assisted Living Facility               DME Arranged: N/A DME Agency: NA  Prior Living Arrangements/Services Living arrangements for the past 2 months: Assisted Living Facility Lives with:: Facility Resident Patient language and need for interpreter reviewed:: Yes Do you feel safe going back to the place where you live?: Yes      Need for Family Participation in Patient Care: Yes (Comment) Care giver support system in place?: Yes (comment) Criminal Activity/Legal Involvement Pertinent to  Current Situation/Hospitalization: No - Comment as needed  Activities of Daily Living Home Assistive Devices/Equipment: Wheelchair ADL Screening (condition at time of admission) Patient's cognitive ability adequate to safely complete daily activities?: No Is the patient deaf or have difficulty hearing?: Yes Does the patient have difficulty seeing, even when wearing glasses/contacts?: No Does the patient have difficulty concentrating, remembering, or making decisions?: Yes Patient able to express need for assistance with ADLs?: Yes Does the patient have difficulty dressing or bathing?: Yes Independently performs ADLs?: Yes (appropriate for developmental age) Does the patient have difficulty walking or climbing stairs?: Yes Weakness of Legs: Both Weakness of Arms/Hands: Both  Permission Sought/Granted Permission sought to share information with : Facility Industrial/product designer granted to share information with : Yes, Verbal Permission Granted Permission granted to share info w AGENCY: SNFs  Emotional Assessment Attitude/Demeanor/Rapport: Engaged Affect (typically observed): Accepting Orientation: : Oriented to Self,Oriented to Place,Oriented to  Time,Oriented to Situation Alcohol / Substance Use: Not Applicable Psych Involvement: No (comment)  Admission diagnosis:  Hip fracture (HCC) [S72.009A] Patient Active Problem List   Diagnosis Date Noted  . Hip fracture (HCC) 10/17/2020  . Malnutrition of moderate degree 11/05/2019  . Fall 11/04/2019  . Macrocytic anemia 11/04/2019  . Left rib fracture 11/04/2019  . Fibromyalgia   . Macular degeneration   . Essential hypertension   . Uninodular goiter   . Mixed hyperlipidemia   . Elevated fasting blood sugar   . Lumbar disc narrowing   . Restless leg syndrome   . Thoracic spondylosis   . Degeneration of cervical intervertebral disc    PCP:  [name], MD Pharmacy:  [id number] - [place], [place] - [address] [place] [address] Phone: [phone] Fax: [phone]  Readmission Risk Interventions No flowsheet data found.

## 2020-10-19 NOTE — Progress Notes (Signed)
Bleeding scan negative.  For egd in the a.m.  Florencia Reasons, M.D. Pager [phone] If no answer or after 5 PM call [phone]

## 2020-10-19 NOTE — [place] FL2 (Signed)
[place] MEDICAID FL2 LEVEL OF CARE SCREENING TOOL     IDENTIFICATION  Patient Name: Karen Mendez Birthdate: [birth year] Sex: female Admission Date (Current Location): 10/17/2020  [facility] and [place] Number:  Producer, television/film/video and Address:  [facility],  [address] [place], [place] [address]      Provider Number: [id number]  Attending Physician Name and Address:  Alwyn Ren, MD  Relative Name and Phone Number:  Wonda Cheng (daughter) Ph: [phone]    Current Level of Care: Hospital Recommended Level of Care: Skilled Nursing Facility Prior Approval Number:    Date Approved/Denied:   PASRR Number: [id number]  Discharge Plan: SNF    Current Diagnoses: Patient Active Problem List   Diagnosis Date Noted  . Hip fracture (HCC) 10/17/2020  . Malnutrition of moderate degree 11/05/2019  . Fall 11/04/2019  . Macrocytic anemia 11/04/2019  . Left rib fracture 11/04/2019  . Fibromyalgia   . Macular degeneration   . Essential hypertension   . Uninodular goiter   . Mixed hyperlipidemia   . Elevated fasting blood sugar   . Lumbar disc narrowing   . Restless leg syndrome   . Thoracic spondylosis   . Degeneration of cervical intervertebral disc     Orientation RESPIRATION BLADDER Height & Weight     Self,Time,Situation,Place  Normal Incontinent Weight: 132 lb 4.4 oz (60 kg) Height:  5\' 1"  (154.9 cm)  BEHAVIORAL SYMPTOMS/MOOD NEUROLOGICAL BOWEL NUTRITION STATUS      Incontinent Diet (Regular diet)  AMBULATORY STATUS COMMUNICATION OF NEEDS Skin   Total Care Verbally Surgical wounds,Other (Comment) (Ecchymosis: bilateral arms)                       Personal Care Assistance Level of Assistance  Bathing,Feeding,Dressing Bathing Assistance: Maximum assistance Feeding assistance: Limited assistance Dressing Assistance: Maximum assistance     Functional Limitations Info  Sight,Hearing,Speech Sight Info: Impaired Hearing Info:  Impaired Speech Info: Adequate    SPECIAL CARE FACTORS FREQUENCY  PT (By licensed PT),OT (By licensed OT)     PT Frequency: 5x's/week OT Frequency: 5x's/week            Contractures Contractures Info: Not present    Additional Factors Info  Code Status,Allergies,Psychotropic Code Status Info: DNR Allergies Info: Etodolac, Nefazodone, Penicillins, Sulfamethoxazole Psychotropic Info: Zoloft         Current Medications (10/19/2020):  This is the current hospital active medication list Current Facility-Administered Medications  Medication Dose Route Frequency Provider Last Rate Last Admin  . 0.9 %  sodium chloride infusion (Manually program via Guardrails IV Fluids)   Intravenous Once 10/21/2020, MD      . 0.9 %  sodium chloride infusion   Intravenous Continuous Alwyn Ren, MD 120 mL/hr at 10/18/20 1559 Infusion Verify at 10/18/20 1559  . acetaminophen (TYLENOL) tablet 325-650 mg  325-650 mg Oral Q6H PRN 10/20/20, MD      . acetaminophen (TYLENOL) tablet 500 mg  500 mg Oral Q6H Terance Hart, MD   500 mg at 10/19/20 10/21/20  . docusate sodium (COLACE) capsule 100 mg  100 mg Oral BID 7673, MD   100 mg at 10/18/20 2202  . feeding supplement (ENSURE ENLIVE / ENSURE PLUS) liquid 237 mL  237 mL Oral BID BM 2203, MD   237 mL at 10/18/20 1400  . HYDROcodone-acetaminophen (NORCO) 7.5-325 MG per tablet 1-2 tablet  1-2 tablet Oral Q6H PRN 09-27-1976,  Gardiner Ramus, MD      . HYDROcodone-acetaminophen (NORCO/VICODIN) 5-325 MG per tablet 1-2 tablet  1-2 tablet Oral Q6H PRN Alwyn Ren, MD   1 tablet at 10/18/20 2202  . menthol-cetylpyridinium (CEPACOL) lozenge 3 mg  1 lozenge Oral PRN Terance Hart, MD       Or  . phenol (CHLORASEPTIC) mouth spray 1 spray  1 spray Mouth/Throat PRN Terance Hart, MD      . multivitamin with minerals tablet 1 tablet  1 tablet Oral Daily Terance Hart, MD   1 tablet at 10/19/20  [phone]  . ondansetron (ZOFRAN) tablet 4 mg  4 mg Oral Q6H PRN Terance Hart, MD   4 mg at 10/19/20 5366   Or  . ondansetron [facility]) injection 4 mg  4 mg Intravenous Q6H PRN Terance Hart, MD      . pantoprazole (PROTONIX) 80 mg in sodium chloride 0.9 % 100 mL (0.8 mg/mL) infusion  8 mg/hr Intravenous Continuous Alwyn Ren, MD      . polyethylene glycol (MIRALAX / GLYCOLAX) packet 17 g  17 g Oral Daily Alwyn Ren, MD   17 g at 10/18/20 1401  . senna-docusate (Senokot-S) tablet 1 tablet  1 tablet Oral QHS PRN Jae Dire, MD      . sertraline (ZOLOFT) tablet 75 mg  75 mg Oral Daily Jae Dire, MD   75 mg at 10/19/20 4403     Discharge Medications: Please see discharge summary for a list of discharge medications.  Relevant Imaging Results:  Relevant Lab Results:   Additional Information SSN: [id number]  Ewing Schlein, LCSW

## 2020-10-19 NOTE — Progress Notes (Signed)
PROGRESS NOTE    Karen Mendez  NOI:[id number] DOB: 1922-07-05 DOA: 10/17/2020 PCP: [name], MD   Brief Narrative: 87 year old female lives at [facility] status post mechanical fall and right hip fracture.  She is now status post repair.  Her past medical history includes hypertension fibromyalgia recurrent falls hyperlipidemia and degenerative joint disease.  Patient reports that she has not walked much for the past year after she had a fall with several rib fractures and uses wheelchair to get around.  Assessment & Plan:   Principal Problem:   Hip fracture (HCC) Active Problems:   Essential hypertension   Mixed hyperlipidemia   Fall   #1 nondisplaced right femoral neck fracture and nondisplaced fractures to the medial right inferior pubic ramus status post mechanical fall.  She is status post close reduction and percutaneous screw fixation of the right femoral neck fracture. Ortho has recommended weightbearing as tolerated to right lower extremities for transfers only.  DVT prophylaxis with scd  Limit narcotics Tylenol 3 times a day Bowel regime Follow-up on discharge with Ortho for wound check and suture removal and x-rays. PT evaluation rec snf  #2 history of chronic diastolic heart failure patient is more on the dry side today.  Holding home Lasix.  Slow IV hydration.  #3 hypertension continue beta-blocker   #4 hyperlipidemia not on statins prior to admit  #5 Acute gi bleed --macrocytic anemia her hemoglobin was 7.4 from 10.5 yesterday, she received 1 unit of packed RBCs with her hemoglobin trending down to 5.8 today.  She had 2 large loose dark BMs today.  2 units of blood transfusion for today ordered.  GI consulted and appreciate input.  Continue PPI drip.   Patient has a history of anemia in the past when she was admitted to the hospital with a hemoglobin of 5.3 at that time she was severe macrocytic anemia and was given blood transfusion and was discharged. B12  level was less than 50 in 2021, iron 74 Folate was 8.8 in 11/04/2019 I will give her a dose of Feraheme and start her on B12 shots.  #6 AKI resolving creatinine 1.10 from 1.26.  Continue IV fluids cautiously.    Nutrition Problem: Increased nutrient needs Etiology: post-op healing   Signs/Symptoms: estimated needs    Interventions: Ensure Enlive (each supplement provides 350kcal and 20 grams of protein),MVI  Estimated body mass index is 24.99 kg/m as calculated from the following:   Height as of this encounter: 5\' 1"  (1.549 m).   Weight as of this encounter: 60 kg.  DVT prophylaxis: SCD due to GI bleed  code Status: DNR  family Communication: dw daughter  Disposition Plan:  Status is: Inpatient  Dispo: The patient is from: Skilled nursing facility              Anticipated d/c is to: SNF              Patient currently is not medically stable to d/c.   Difficult to place patient No  Consultants:   Ortho  Procedures: Right hip surgery 10/17/2020 Antimicrobials: None  Subjective: She was in bed drinking coffee, she was laying in a pool of dark loose stools objective: Vitals:   10/19/20 1200 10/19/20 1224 10/19/20 1228 10/19/20 1243  BP: (!) 148/21 (!) 127/49 (!) 127/49 (!) 141/27  Pulse: 77 78  91  Resp: 14 14  18   Temp: 97.6 F (36.4 C) 97.7 F (36.5 C)  (!) 97.5 F (36.4 C)  TempSrc:  Axillary Axillary    SpO2: 96% 99%    Weight:      Height:        Intake/Output Summary (Last 24 hours) at 10/19/2020 1316 Last data filed at 10/19/2020 1211 Gross per 24 hour  Intake 3324.96 ml  Output 1100 ml  Net 2224.96 ml   Filed Weights   10/18/20 1100  Weight: 60 kg    Examination:  General exam: Appears calm and comfortable  Respiratory system: Clear to auscultation. Respiratory effort normal. Cardiovascular system: S1 & S2 heard, RRR. No JVD, murmurs, rubs, gallops or clicks. No pedal edema. Gastrointestinal system: Abdomen is nondistended, soft and nontender.  No organomegaly or masses felt. Normal bowel sounds heard. Central nervous system: Alert and oriented. No focal neurological deficits. Extremities: Right hip covered with dressings clean dry intact Skin: No rashes, lesions or ulcers Psychiatry: Judgement and insight appear normal. Mood & affect appropriate.     Data Reviewed: I have personally reviewed following labs and imaging studies  CBC: Recent Labs  Lab 10/17/20 0955 10/18/20 0738 10/19/20 0740  WBC 10.7* 8.8 9.2  NEUTROABS 8.9* 7.0  --   HGB 10.5* 7.4* 5.8*  HCT 32.2* 24.2* 18.0*  MCV 103.2* 109.5* 102.3*  PLT 190 174 178   Basic Metabolic Panel: Recent Labs  Lab 10/17/20 0955 10/18/20 0322 10/19/20 0740  NA 141 142 142  K 4.5 4.7 4.4  CL 106 110 115*  CO2 25 26 25   GLUCOSE 111* 122* 122*  BUN 41* 53* 51*  CREATININE 0.90 1.26* 1.10*  CALCIUM 8.8* 7.8* 7.4*   GFR: Estimated Creatinine Clearance: 24.9 mL/min (A) (by C-G formula based on SCr of 1.1 mg/dL (H)). Liver Function Tests: No results for input(s): AST, ALT, ALKPHOS, BILITOT, PROT, ALBUMIN in the last 168 hours. No results for input(s): LIPASE, AMYLASE in the last 168 hours. No results for input(s): AMMONIA in the last 168 hours. Coagulation Profile: Recent Labs  Lab 10/17/20 0955  INR 1.1   Cardiac Enzymes: No results for input(s): CKTOTAL, CKMB, CKMBINDEX, TROPONINI in the last 168 hours. BNP (last 3 results) No results for input(s): PROBNP in the last 8760 hours. HbA1C: No results for input(s): HGBA1C in the last 72 hours. CBG: No results for input(s): GLUCAP in the last 168 hours. Lipid Profile: No results for input(s): CHOL, HDL, LDLCALC, TRIG, CHOLHDL, LDLDIRECT in the last 72 hours. Thyroid Function Tests: No results for input(s): TSH, T4TOTAL, FREET4, T3FREE, THYROIDAB in the last 72 hours. Anemia Panel: No results for input(s): VITAMINB12, FOLATE, FERRITIN, TIBC, IRON, RETICCTPCT in the last 72 hours. Sepsis Labs: No results for  input(s): PROCALCITON, LATICACIDVEN in the last 168 hours.  Recent Results (from the past 240 hour(s))  Resp Panel by RT-PCR (Flu A&B, Covid) Nasopharyngeal Swab     Status: None   Collection Time: 10/17/20  9:55 AM   Specimen: Nasopharyngeal Swab; Nasopharyngeal(NP) swabs in vial transport medium  Result Value Ref Range Status   SARS Coronavirus 2 by RT PCR NEGATIVE NEGATIVE Final    Comment: (NOTE) SARS-CoV-2 target nucleic acids are NOT DETECTED.  The SARS-CoV-2 RNA is generally detectable in upper respiratory specimens during the acute phase of infection. The lowest concentration of SARS-CoV-2 viral copies this assay can detect is 138 copies/mL. A negative result does not preclude SARS-Cov-2 infection and should not be used as the sole basis for treatment or other patient management decisions. A negative result may occur with  improper specimen collection/handling, submission of specimen other than  nasopharyngeal swab, presence of viral mutation(s) within the areas targeted by this assay, and inadequate number of viral copies(<138 copies/mL). A negative result must be combined with clinical observations, patient history, and epidemiological information. The expected result is Negative.  Fact Sheet for Patients:  BloggerCourse.com  Fact Sheet for Healthcare Providers:  SeriousBroker.it  This test is no t yet approved or cleared by the [place] FDA and  has been authorized for detection and/or diagnosis of SARS-CoV-2 by FDA under an Emergency Use Authorization (EUA). This EUA will remain  in effect (meaning this test can be used) for the duration of the COVID-19 declaration under Section 564(b)(1) of the Act, 21 U.S.C.section 360bbb-3(b)(1), unless the authorization is terminated  or revoked sooner.       Influenza A by PCR NEGATIVE NEGATIVE Final   Influenza B by PCR NEGATIVE NEGATIVE Final    Comment: (NOTE) The  Xpert Xpress SARS-CoV-2/FLU/RSV plus assay is intended as an aid in the diagnosis of influenza from Nasopharyngeal swab specimens and should not be used as a sole basis for treatment. Nasal washings and aspirates are unacceptable for Xpert Xpress SARS-CoV-2/FLU/RSV testing.  Fact Sheet for Patients: BloggerCourse.com  Fact Sheet for Healthcare Providers: SeriousBroker.it  This test is not yet approved or cleared by the [place] FDA and has been authorized for detection and/or diagnosis of SARS-CoV-2 by FDA under an Emergency Use Authorization (EUA). This EUA will remain in effect (meaning this test can be used) for the duration of the COVID-19 declaration under Section 564(b)(1) of the Act, 21 U.S.C. section 360bbb-3(b)(1), unless the authorization is terminated or revoked.  Performed at [facility], [address], [place], [place] [address]   MRSA PCR Screening     Status: None   Collection Time: 10/19/20 11:11 AM   Specimen: Nasopharyngeal  Result Value Ref Range Status   MRSA by PCR NEGATIVE NEGATIVE Final    Comment:        The GeneXpert MRSA Assay (FDA approved for NASAL specimens only), is one component of a comprehensive MRSA colonization surveillance program. It is not intended to diagnose MRSA infection nor to guide or monitor treatment for MRSA infections. Performed at [facility], [address] [place], [place] [address]          Radiology Studies: DG C-Arm 1-60 Min-No Report  Result Date: 10/17/2020 Fluoroscopy was utilized by the requesting physician.  No radiographic interpretation.   DG HIP OPERATIVE UNILAT W OR W/O PELVIS RIGHT  Result Date: 10/17/2020 CLINICAL DATA:  Right hip fracture repair. FLUOROSCOPY TIME:  46 seconds. EXAM: OPERATIVE RIGHT HIP (WITH PELVIS IF PERFORMED) 2 VIEWS TECHNIQUE: Fluoroscopic spot image(s) were submitted for  interpretation post-operatively. COMPARISON:  None. FINDINGS: Three nails have been placed across the right femoral neck fracture. IMPRESSION: Three nails have been placed across the right femoral neck fracture. Electronically Signed   By: Gerome Sam III M.D   On: 10/17/2020 15:03        Scheduled Meds: . acetaminophen  500 mg Oral Q6H  . Chlorhexidine Gluconate Cloth  6 each Topical Daily  . docusate sodium  100 mg Oral BID  . feeding supplement  237 mL Oral BID BM  . mouth rinse  15 mL Mouth Rinse BID  . multivitamin with minerals  1 tablet Oral Daily  . polyethylene glycol  17 g Oral Daily  . sertraline  75 mg Oral Daily   Continuous Infusions: . sodium chloride 75 mL/hr at  10/19/20 1248  . pantoprozole (PROTONIX) infusion 8 mg/hr (10/19/20 1211)     LOS: 2 days   Alwyn RenElizabeth G Janera Peugh, MD  10/19/2020, 1:16 PM

## 2020-10-19 NOTE — Hospital Course (Addendum)
Pt with ongoing hematochezia of unclear source, requiring 5 units of packed cells most recently 10/20/2020.  Nuclear medicine study 10/19/20 during bleeding negative.  EGD 10/20/2020 negative.  CT of abdomen and pelvis 10/20/2020 negative for obvious source of bleeding.  Capsule study of small bowel 10/21/20  Tentative unprepped colonoscopy 10/22/2020

## 2020-10-19 NOTE — Progress Notes (Signed)
Orthopedic Tech Progress Note Patient Details:  Karen Mendez 30-Aug-1922 [id number]  Patient ID: Karen Mendez, female   DOB: 1922-07-21, 87 y.o.   MRN: [id number]   Karen Mendez 10/19/2020, 8:52 AMPatient can not use Trapeze bar

## 2020-10-19 NOTE — Consult Note (Signed)
Referring Provider: Dr. Jerolyn Center [facility]) Primary Care Physician:  [name], MD Primary Gastroenterologist:  Gentry Fitz  Reason for Consultation: Rectal bleeding  HPI: Karen Mendez is a 87 y.o. female with past medical history noted below currently hospitalized for nondisplaced right femoral neck fracture s/p pinning presenting for consultation of rectal bleeding.    Patient had a large maroon stool last night, as well as a large maroon stool this morning. Per RN, stool was maroon and not black.  Patient denies any abdominal pain.  Denies nausea, vomiting, hematemesis, dysphagia.  Does report a poor appetite and only a few bites of pancakes and sausage this morning.  Denies prior episodes of GI bleeding.  Denies prior EGD.  Per chart review, patient previously reported normal colonoscopy many years ago (this is on file).  She takes 81 mg aspirin daily but denies other NSAID use.  She was on 325 mg aspirin daily for 2 days (5/22, 5/23).  She was given Lovenox 30 mg x 1 on 5/22 but is not on anticoagulation as an outpatient.   Past Medical History:  Diagnosis Date  . Chest pain 09/26/2017  . Degeneration of cervical intervertebral disc   . Elevated fasting blood sugar   . Fibromyalgia   . Hyperlipidemia   . Hypertension   . Lumbar disc narrowing   . Macular degeneration   . Restless leg syndrome   . Stable angina (HCC) 01/25/2018  . Thoracic spondylosis   . Uninodular goiter     Past Surgical History:  Procedure Laterality Date  . CATARACT EXTRACTION Right   . CATARACT EXTRACTION Left   . TOTAL ABDOMINAL HYSTERECTOMY      Prior to Admission medications   Medication Sig Start Date End Date Taking? Authorizing Provider  acetaminophen (TYLENOL) 500 MG tablet Take 1,000 mg by mouth 3 (three) times daily.   Yes [provider]  aspirin EC 81 MG tablet Take 81 mg by mouth daily.   Yes [provider]  carvedilol (COREG) 3.125 MG tablet Take 1 tablet (3.125 mg  total) by mouth 2 (two) times daily. 11/07/19  Yes Tyrone Nine, MD  furosemide (LASIX) 20 MG tablet Take 20 mg by mouth daily. 10/05/20  Yes [provider]  loratadine (CLARITIN) 10 MG tablet Take 10 mg by mouth daily.   Yes [provider]  meclizine (ANTIVERT) 12.5 MG tablet Take 12.5 mg by mouth 2 (two) times daily.   Yes [provider]  Multiple Vitamins-Minerals (OCULAR VITAMINS) TABS Take 1 tablet by mouth 2 (two) times daily.   Yes [provider]  Polyethyl Glycol-Propyl Glycol (SYSTANE) 0.4-0.3 % SOLN Place 2 drops into both eyes 2 (two) times daily.   Yes [provider]  potassium chloride (MICRO-K) 10 MEQ CR capsule Take 10 mEq by mouth daily. 09/22/20  Yes [provider]  sertraline (ZOLOFT) 50 MG tablet Take 75 mg by mouth daily. 09/24/20  Yes [provider]  vitamin B-12 (CYANOCOBALAMIN) 1000 MCG tablet Take 1 tablet (1,000 mcg total) by mouth daily. 11/07/19  Yes Tyrone Nine, MD    Scheduled Meds: . sodium chloride   Intravenous Once  . acetaminophen  500 mg Oral Q6H  . docusate sodium  100 mg Oral BID  . feeding supplement  237 mL Oral BID BM  . multivitamin with minerals  1 tablet Oral Daily  . polyethylene glycol  17 g Oral Daily  . sertraline  75 mg Oral Daily   Continuous  Infusions: . sodium chloride 120 mL/hr at 10/18/20 1559  . pantoprozole (PROTONIX) infusion    . pantoprazole (PROTONIX) 80 mg IVPB     PRN Meds:.acetaminophen, HYDROcodone-acetaminophen, HYDROcodone-acetaminophen, menthol-cetylpyridinium **OR** phenol, ondansetron **OR** ondansetron (ZOFRAN) IV, senna-docusate  Allergies as of 10/17/2020 - Review Complete 10/17/2020  Allergen Reaction Noted  . Etodolac Other (See Comments) 04/07/2011  . Nefazodone Other (See Comments) 04/07/2011  . Penicillins Rash 04/07/2011  . Sulfamethoxazole Rash 04/07/2011    Family History  Problem Relation Age of Onset  . Heart attack Mother   .  Hypertension Mother   . Heart attack Father   . Congestive Heart Failure Sister   . Lung cancer Sister   . Melanoma Child     Social History   Socioeconomic History  . Marital status: Widowed    Spouse name: Not on file  . Number of children: 1  . Years of education: 107  . Highest education level: High school graduate  Occupational History  . Occupation: Diplomatic Services operational officer    Comment: retired   Tobacco Use  . Smoking status: Never Smoker  . Smokeless tobacco: Never Used  Vaping Use  . Vaping Use: Never used  Substance and Sexual Activity  . Alcohol use: Never  . Drug use: Never  . Sexual activity: Not on file  Other Topics Concern  . Not on file  Social History Narrative   epworth sleepiness scale = 4 (09/26/17)   Social Determinants of Health   Financial Resource Strain: Not on file  Food Insecurity: Not on file  Transportation Needs: Not on file  Physical Activity: Not on file  Stress: Not on file  Social Connections: Not on file  Intimate Partner Violence: Not on file    Review of Systems: Review of Systems  Constitutional: Negative for chills and fever.  HENT: Negative for sinus pain and sore throat.   Eyes: Negative for pain and redness.  Respiratory: Negative for shortness of breath and wheezing.   Cardiovascular: Negative for chest pain and palpitations.  Gastrointestinal: Positive for blood in stool. Negative for abdominal pain, constipation, diarrhea, heartburn, melena, nausea and vomiting.  Genitourinary: Negative for flank pain and hematuria.  Musculoskeletal: Positive for falls and joint pain.  Skin: Negative for itching and rash.  Neurological: Negative for seizures and loss of consciousness.  Endo/Heme/Allergies: Negative for polydipsia. Does not bruise/bleed easily.  Psychiatric/Behavioral: Negative for substance abuse. The patient is not nervous/anxious.      Physical Exam: Vital signs: Vitals:   10/18/20 2133 10/19/20 0500  BP: (!) 130/50 126/70   Pulse: 84 89  Resp: 16   Temp: 98 F (36.7 C) 99 F (37.2 C)  SpO2: 97% 98%   Last BM Date: 10/18/20  Physical Exam Vitals reviewed.  Constitutional:      General: She is not in acute distress. HENT:     Head: Normocephalic and atraumatic.     Nose: Nose normal. No congestion.     Mouth/Throat:     Mouth: Mucous membranes are moist.     Pharynx: Oropharynx is clear.  Eyes:     Extraocular Movements: Extraocular movements intact.     Comments: Conjunctival pallor  Cardiovascular:     Rate and Rhythm: Normal rate and regular rhythm.  Pulmonary:     Effort: Pulmonary effort is normal. No respiratory distress.  Abdominal:     General: Bowel sounds are normal. There is no distension.     Palpations: Abdomen is soft. There is no mass.  Tenderness: There is no abdominal tenderness. There is no guarding or rebound.     Hernia: No hernia is present.  Musculoskeletal:        General: Injury: Right hip.     Cervical back: Normal range of motion and neck supple.     Right lower leg: No edema.     Left lower leg: No edema.     Comments: Right hip with dressing  Neurological:     General: No focal deficit present.     Mental Status: She is oriented to person, place, and time. She is lethargic.  Psychiatric:        Mood and Affect: Mood normal.        Behavior: Behavior normal. Behavior is cooperative.      GI:  Lab Results: Recent Labs    10/17/20 0955 10/18/20 0738 10/19/20 0740  WBC 10.7* 8.8 9.2  HGB 10.5* 7.4* 5.8*  HCT 32.2* 24.2* 18.0*  PLT 190 174 178   BMET Recent Labs    10/17/20 0955 10/18/20 0322 10/19/20 0740  NA 141 142 142  K 4.5 4.7 4.4  CL 106 110 115*  CO2 25 26 25   GLUCOSE 111* 122* 122*  BUN 41* 53* 51*  CREATININE 0.90 1.26* 1.10*  CALCIUM 8.8* 7.8* 7.4*   LFT No results for input(s): PROT, ALBUMIN, AST, ALT, ALKPHOS, BILITOT, BILIDIR, IBILI in the last 72 hours. PT/INR Recent Labs    10/17/20 0955  LABPROT 14.0  INR 1.1      Studies/Results: DG C-Arm 1-60 Min-No Report  Result Date: 10/17/2020 Fluoroscopy was utilized by the requesting physician.  No radiographic interpretation.   DG HIP OPERATIVE UNILAT W OR W/O PELVIS RIGHT  Result Date: 10/17/2020 CLINICAL DATA:  Right hip fracture repair. FLUOROSCOPY TIME:  46 seconds. EXAM: OPERATIVE RIGHT HIP (WITH PELVIS IF PERFORMED) 2 VIEWS TECHNIQUE: Fluoroscopic spot image(s) were submitted for interpretation post-operatively. COMPARISON:  None. FINDINGS: Three nails have been placed across the right femoral neck fracture. IMPRESSION: Three nails have been placed across the right femoral neck fracture. Electronically Signed   By: 10/19/2020 III M.D   On: 10/17/2020 15:03    Impression: Rectal bleeding: Most likely lower GI source (diverticulosis); however, cannot rule out brisk upper GI bleed, given aspirin use, as well as elevated BUN (51) -Hemoglobin 5.8, decreased from 7.4 yesterday  Nondisplaced right femoral neck fracture s/p pinning  Angina, on 81 mg ASA daily  Plan: EGD for further evaluation and to rule out upper GI bleeding as source of recurrent stools.  We will await anesthesia decision whether or not patient can undergo procedure today, given consumption of a few bites of pancakes and sausage at 7:45 this morning.  If EGD cannot be done today, recommend proceeding with bleeding scan today (with IR consultation if positive).  If bleeding scan is negative, we will proceed with EGD tomorrow.  I thoroughly discussed the procedure with the patient (at bedside) and patient's daughter 10/19/2020 (Via phone) to include nature, alternatives, benefits, and risks (including but not limited to bleeding, infection, perforation, anesthesia/cardiac pulmonary complications).  Patient and patient's daughter verbalized understanding and gave verbal consent to proceed with EGD.  Continue IV Protonix.  Continue to monitor H&H with transfusion as needed to  maintain hemoglobin greater than 7.  [facility] will follow.   LOS: 2 days   Wonda Cheng  PA-C 10/19/2020, 9:41 AM  Contact #  [phone]

## 2020-10-20 ENCOUNTER — Inpatient Hospital Stay (HOSPITAL_COMMUNITY): Payer: Medicare HMO | Admitting: Certified Registered Nurse Anesthetist

## 2020-10-20 ENCOUNTER — Encounter (HOSPITAL_COMMUNITY)
Admission: EM | Disposition: A | Discharge status: Skilled Nursing Facility | Payer: Self-pay | Source: Skilled Nursing Facility | Attending: Internal Medicine

## 2020-10-20 ENCOUNTER — Inpatient Hospital Stay (HOSPITAL_COMMUNITY): Payer: Medicare HMO

## 2020-10-20 ENCOUNTER — Encounter (HOSPITAL_COMMUNITY): Payer: Self-pay | Admitting: Gastroenterology

## 2020-10-20 DIAGNOSIS — R9431 Abnormal electrocardiogram [ECG] [EKG]: Secondary | ICD-10-CM

## 2020-10-20 DIAGNOSIS — S72001A Fracture of unspecified part of neck of right femur, initial encounter for closed fracture: Secondary | ICD-10-CM | POA: Diagnosis not present

## 2020-10-20 HISTORY — PX: ESOPHAGOGASTRODUODENOSCOPY: SHX5428

## 2020-10-20 HISTORY — PX: BIOPSY: SHX5522

## 2020-10-20 LAB — BASIC METABOLIC PANEL
Anion gap: 5 (ref 5–15)
BUN: 42 mg/dL — ABNORMAL HIGH (ref 8–23)
CO2: 25 mmol/L (ref 22–32)
Calcium: 7.4 mg/dL — ABNORMAL LOW (ref 8.9–10.3)
Chloride: 117 mmol/L — ABNORMAL HIGH (ref 98–111)
Creatinine, Ser: 0.96 mg/dL (ref 0.44–1.00)
GFR, Estimated: 54 mL/min — ABNORMAL LOW (ref >60)
Glucose, Bld: 131 mg/dL — ABNORMAL HIGH (ref 70–99)
Potassium: 4.2 mmol/L (ref 3.5–5.1)
Sodium: 147 mmol/L — ABNORMAL HIGH (ref 135–145)

## 2020-10-20 LAB — ECHOCARDIOGRAM COMPLETE
AR max vel: 1.27 cm2
AV Area VTI: 1.11 cm2
AV Area mean vel: 1.15 cm2
AV Mean grad: 6 mmHg
AV Peak grad: 10.9 mmHg
Ao pk vel: 1.65 m/s
Area-P 1/2: 7.44 cm2
Height: 61 in
S' Lateral: 2.44 cm
Weight: 2017.65 oz

## 2020-10-20 LAB — CBC
HCT: 19.9 % — ABNORMAL LOW (ref 36.0–46.0)
HCT: 30.1 % — ABNORMAL LOW (ref 36.0–46.0)
HCT: 30.2 % — ABNORMAL LOW (ref 36.0–46.0)
HCT: 29.2 % — ABNORMAL LOW (ref 36.0–46.0)
Hemoglobin: 6.7 g/dL — CL (ref 12.0–15.0)
Hemoglobin: 10 g/dL — ABNORMAL LOW (ref 12.0–15.0)
Hemoglobin: 10.2 g/dL — ABNORMAL LOW (ref 12.0–15.0)
Hemoglobin: 9.8 g/dL — ABNORMAL LOW (ref 12.0–15.0)
MCH: 32.5 pg (ref 26.0–34.0)
MCH: 29.9 pg (ref 26.0–34.0)
MCH: 30.7 pg (ref 26.0–34.0)
MCH: 30.7 pg (ref 26.0–34.0)
MCHC: 33.7 g/dL (ref 30.0–36.0)
MCHC: 33.2 g/dL (ref 30.0–36.0)
MCHC: 33.8 g/dL (ref 30.0–36.0)
MCHC: 33.6 g/dL (ref 30.0–36.0)
MCV: 96.6 fL (ref 80.0–100.0)
MCV: 90.1 fL (ref 80.0–100.0)
MCV: 91 fL (ref 80.0–100.0)
MCV: 91.5 fL (ref 80.0–100.0)
Platelets: 162 10*3/uL (ref 150–400)
Platelets: 147 10*3/uL — ABNORMAL LOW (ref 150–400)
Platelets: 141 10*3/uL — ABNORMAL LOW (ref 150–400)
Platelets: 166 10*3/uL (ref 150–400)
RBC: 2.06 MIL/uL — ABNORMAL LOW (ref 3.87–5.11)
RBC: 3.34 MIL/uL — ABNORMAL LOW (ref 3.87–5.11)
RBC: 3.32 MIL/uL — ABNORMAL LOW (ref 3.87–5.11)
RBC: 3.19 MIL/uL — ABNORMAL LOW (ref 3.87–5.11)
RDW: 18.1 % — ABNORMAL HIGH (ref 11.5–15.5)
RDW: 17.9 % — ABNORMAL HIGH (ref 11.5–15.5)
RDW: 18.1 % — ABNORMAL HIGH (ref 11.5–15.5)
RDW: 18.2 % — ABNORMAL HIGH (ref 11.5–15.5)
WBC: 11.6 10*3/uL — ABNORMAL HIGH (ref 4.0–10.5)
WBC: 11.2 10*3/uL — ABNORMAL HIGH (ref 4.0–10.5)
WBC: 12 10*3/uL — ABNORMAL HIGH (ref 4.0–10.5)
WBC: 11.8 10*3/uL — ABNORMAL HIGH (ref 4.0–10.5)
nRBC: 0 % (ref 0.0–0.2)
nRBC: 0.4 % — ABNORMAL HIGH (ref 0.0–0.2)
nRBC: 0.3 % — ABNORMAL HIGH (ref 0.0–0.2)
nRBC: 0.4 % — ABNORMAL HIGH (ref 0.0–0.2)

## 2020-10-20 LAB — PREPARE RBC (CROSSMATCH)

## 2020-10-20 SURGERY — EGD (ESOPHAGOGASTRODUODENOSCOPY)
Anesthesia: Monitor Anesthesia Care

## 2020-10-20 MED ORDER — PROPOFOL 500 MG/50ML IV EMUL
INTRAVENOUS | Status: AC
  Filled 2020-10-20: qty 50

## 2020-10-20 MED ORDER — ACETAMINOPHEN 325 MG PO TABS: 650.0000 mg | ORAL_TABLET | Freq: Once | ORAL | Status: AC

## 2020-10-20 MED ORDER — POLYETHYLENE GLYCOL 3350 17 G PO PACK
17.0000 g | PACK | Freq: Two times a day (BID) | ORAL | Status: DC
  Administered 2020-10-21 – 2020-10-27 (×10): 17 g via ORAL
  Filled 2020-10-20 (×9): qty 1

## 2020-10-20 MED ORDER — SODIUM CHLORIDE 0.9% IV SOLUTION: Freq: Once | INTRAVENOUS | Status: AC

## 2020-10-20 MED ORDER — LIDOCAINE 2% (20 MG/ML) 5 ML SYRINGE
INTRAMUSCULAR | Status: DC | PRN
  Administered 2020-10-20: 60 mg via INTRAVENOUS

## 2020-10-20 MED ORDER — PROPOFOL 10 MG/ML IV BOLUS
INTRAVENOUS | Status: DC | PRN
  Administered 2020-10-20 (×2): 20 mg via INTRAVENOUS

## 2020-10-20 MED ORDER — FUROSEMIDE 10 MG/ML IJ SOLN
20.0000 mg | Freq: Once | INTRAMUSCULAR | Status: AC
  Administered 2020-10-20: 20 mg via INTRAVENOUS
  Filled 2020-10-20: qty 2

## 2020-10-20 MED ORDER — FLUTICASONE PROPIONATE 50 MCG/ACT NA SUSP
1.0000 | Freq: Every day | NASAL | Status: DC
  Administered 2020-10-20 – 2020-10-22 (×3): 1 via NASAL
  Filled 2020-10-20 (×2): qty 16

## 2020-10-20 MED ORDER — SODIUM CHLORIDE 0.45 % IV SOLN: INTRAVENOUS | Status: DC

## 2020-10-20 MED ORDER — PROPOFOL 500 MG/50ML IV EMUL
INTRAVENOUS | Status: DC | PRN
  Administered 2020-10-20: 75 ug/kg/min via INTRAVENOUS

## 2020-10-20 MED ORDER — IOHEXOL 9 MG/ML PO SOLN
500.0000 mL | ORAL | Status: AC
  Administered 2020-10-20: 500 mL via ORAL

## 2020-10-20 MED ORDER — DIPHENHYDRAMINE HCL 25 MG PO CAPS
25.0000 mg | ORAL_CAPSULE | Freq: Once | ORAL | Status: AC
  Filled 2020-10-20: qty 1

## 2020-10-20 MED ORDER — SODIUM CHLORIDE 0.9 % IV SOLN: INTRAVENOUS | Status: DC

## 2020-10-20 MED ORDER — LACTATED RINGERS IV SOLN: INTRAVENOUS | Status: DC

## 2020-10-20 MED ORDER — DIPHENHYDRAMINE HCL 50 MG PO CAPS
ORAL_CAPSULE | ORAL | Status: AC
  Administered 2020-10-20: 25 mg via ORAL
  Filled 2020-10-20: qty 1

## 2020-10-20 NOTE — Progress Notes (Signed)
PROGRESS NOTE    Karen Mendez  TJQ:[id number] DOB: Jan 26, 1923 DOA: 10/17/2020 PCP: [name], MD   Brief Narrative: 87 year old female lives at [place] status post mechanical fall and right hip fracture.  She is now status post repair.  Her past medical history includes hypertension fibromyalgia recurrent falls hyperlipidemia and degenerative joint disease.  Patient reports that she has not walked much for the past year after she had a fall with several rib fractures and uses wheelchair to get around.  10/20/2020 summary-patient was found to have large amounts of loose dark stools which was guaiac positive.  She was transfused 5 units of packed RBC, bite of that her expected.  She had a tagged RBC scan which was negative.  EGD done on 10/20/2020 which showed no signs of active bleeding.  Hb 10 from 6.7 (she received 5 units prbc)   Assessment & Plan:   Principal Problem:   Hip fracture (HCC) Active Problems:   Essential hypertension   Mixed hyperlipidemia   Fall   #1 nondisplaced right femoral neck fracture and nondisplaced fractures to the medial right inferior pubic ramus status post mechanical fall.  She is status post close reduction and percutaneous screw fixation of the right femoral neck fracture. Ortho has recommended weightbearing as tolerated to right lower extremities for transfers only.  DVT prophylaxis with scd  Limit narcotics Tylenol 3 times a day Bowel regime Follow-up on discharge with Ortho for wound check and suture removal and x-rays. PT evaluation rec snf  #2 history of chronic diastolic heart failure patient is more on the dry side today.  Holding home Lasix.  Slow IV hydration.  #3 hypertension continue beta-blocker   #4 hyperlipidemia not on statins prior to admit  #5 Acute gi bleed --patient has received a total of 5 units of packed RBC during this hospital stay.  Her hemoglobin finally today is 10.  She has not had any further bowel movements  in the last 24 hours.  CT abdomen and pelvis ordered for today to rule out small bowel tumors or diverticulosis.  GI planning for capsule endoscopy plus or minus colonoscopy.  Patient has a history of microcytic anemia thought to be due to deficiency of B12.  Continue PPI drip.   Patient has a history of anemia in the past when she was admitted to the hospital with a hemoglobin of 5.3 at that time she was severe macrocytic anemia and was given blood transfusion and was discharged. B12 level was less than 50 in 2021, iron 74 Folate was 8.8 in 11/04/2019 She was given Feraheme and started on B12 shots.  #6 AKI resolved     #7 hypernatremia due to normal saline change fluids to half-normal saline.  Nutrition Problem: Increased nutrient needs Etiology: post-op healing   Signs/Symptoms: estimated needs    Interventions: Ensure Enlive (each supplement provides 350kcal and 20 grams of protein),MVI  Estimated body mass index is 23.83 kg/m as calculated from the following:   Height as of this encounter: 5\' 1"  (1.549 m).   Weight as of this encounter: 57.2 kg.  DVT prophylaxis: SCD due to GI bleed  code Status: DNR  family Communication: dw daughter on 10/19/2020.  I was unable to reach her today. Disposition Plan:  Status is: Inpatient  Dispo: The patient is from: Skilled nursing facility              Anticipated d/c is to: SNF  Patient currently is not medically stable to d/c.   Difficult to place patient No  Consultants:   Ortho  Procedures: Right hip surgery 10/17/2020 Antimicrobials: None  Subjective: She is resting in bed oral mucosa very dry appears much weaker than yesterday objective: Vitals:   10/20/20 1016 10/20/20 1020 10/20/20 1030 10/20/20 1136  BP: (!) 146/31 (!) 156/38 (!) 166/34   Pulse: 74 70 69   Resp: 11 12 13    Temp: 98.8 F (37.1 C)   (!) 97.5 F (36.4 C)  TempSrc: Oral   Oral  SpO2: 100% 100% 100%   Weight:      Height:         Intake/Output Summary (Last 24 hours) at 10/20/2020 1248 Last data filed at 10/20/2020 1016 Gross per 24 hour  Intake 2683.73 ml  Output 750 ml  Net 1933.73 ml   Filed Weights   10/18/20 1100 10/20/20 0458 10/20/20 0820  Weight: 60 kg 57.2 kg 57.2 kg    Examination:  General exam: Appears calm and comfortable  Respiratory system: Clear to auscultation. Respiratory effort normal. Cardiovascular system: S1 & S2 heard, RRR. No JVD, murmurs, rubs, gallops or clicks. No pedal edema. Gastrointestinal system: Abdomen is nondistended, soft and nontender. No organomegaly or masses felt. Normal bowel sounds heard. Central nervous system: Alert and oriented. No focal neurological deficits. Extremities: Right hip covered with dressings clean dry intact Skin: No rashes, lesions or ulcers Psychiatry: Judgement and insight appear normal. Mood & affect appropriate.     Data Reviewed: I have personally reviewed following labs and imaging studies  CBC: Recent Labs  Lab 10/17/20 0955 10/18/20 0738 10/19/20 0740 10/19/20 1911 10/20/20 0053 10/20/20 1126  WBC 10.7* 8.8 9.2  --  11.6* 11.2*  NEUTROABS 8.9* 7.0  --   --   --   --   HGB 10.5* 7.4* 5.8* 7.9* 6.7* 10.0*  HCT 32.2* 24.2* 18.0* 23.6* 19.9* 30.1*  MCV 103.2* 109.5* 102.3*  --  96.6 90.1  PLT 190 174 178  --  162 147*   Basic Metabolic Panel: Recent Labs  Lab 10/17/20 0955 10/18/20 0322 10/19/20 0740 10/20/20 1126  NA 141 142 142 147*  K 4.5 4.7 4.4 4.2  CL 106 110 115* 117*  CO2 25 26 25 25   GLUCOSE 111* 122* 122* 131*  BUN 41* 53* 51* 42*  CREATININE 0.90 1.26* 1.10* 0.96  CALCIUM 8.8* 7.8* 7.4* 7.4*   GFR: Estimated Creatinine Clearance: 25.9 mL/min (by C-G formula based on SCr of 0.96 mg/dL). Liver Function Tests: No results for input(s): AST, ALT, ALKPHOS, BILITOT, PROT, ALBUMIN in the last 168 hours. No results for input(s): LIPASE, AMYLASE in the last 168 hours. No results for input(s): AMMONIA in the  last 168 hours. Coagulation Profile: Recent Labs  Lab 10/17/20 0955  INR 1.1   Cardiac Enzymes: No results for input(s): CKTOTAL, CKMB, CKMBINDEX, TROPONINI in the last 168 hours. BNP (last 3 results) No results for input(s): PROBNP in the last 8760 hours. HbA1C: No results for input(s): HGBA1C in the last 72 hours. CBG: No results for input(s): GLUCAP in the last 168 hours. Lipid Profile: No results for input(s): CHOL, HDL, LDLCALC, TRIG, CHOLHDL, LDLDIRECT in the last 72 hours. Thyroid Function Tests: No results for input(s): TSH, T4TOTAL, FREET4, T3FREE, THYROIDAB in the last 72 hours. Anemia Panel: No results for input(s): VITAMINB12, FOLATE, FERRITIN, TIBC, IRON, RETICCTPCT in the last 72 hours. Sepsis Labs: No results for input(s): PROCALCITON, LATICACIDVEN in the last  168 hours.  Recent Results (from the past 240 hour(s))  Resp Panel by RT-PCR (Flu A&B, Covid) Nasopharyngeal Swab     Status: None   Collection Time: 10/17/20  9:55 AM   Specimen: Nasopharyngeal Swab; Nasopharyngeal(NP) swabs in vial transport medium  Result Value Ref Range Status   SARS Coronavirus 2 by RT PCR NEGATIVE NEGATIVE Final    Comment: (NOTE) SARS-CoV-2 target nucleic acids are NOT DETECTED.  The SARS-CoV-2 RNA is generally detectable in upper respiratory specimens during the acute phase of infection. The lowest concentration of SARS-CoV-2 viral copies this assay can detect is 138 copies/mL. A negative result does not preclude SARS-Cov-2 infection and should not be used as the sole basis for treatment or other patient management decisions. A negative result may occur with  improper specimen collection/handling, submission of specimen other than nasopharyngeal swab, presence of viral mutation(s) within the areas targeted by this assay, and inadequate number of viral copies(<138 copies/mL). A negative result must be combined with clinical observations, patient history, and  epidemiological information. The expected result is Negative.  Fact Sheet for Patients:  BloggerCourse.comhttps://www.fda.gov/media/152166/download  Fact Sheet for Healthcare Providers:  SeriousBroker.ithttps://www.fda.gov/media/152162/download  This test is no t yet approved or cleared by the [place] FDA and  has been authorized for detection and/or diagnosis of SARS-CoV-2 by FDA under an Emergency Use Authorization (EUA). This EUA will remain  in effect (meaning this test can be used) for the duration of the COVID-19 declaration under Section 564(b)(1) of the Act, 21 U.S.C.section 360bbb-3(b)(1), unless the authorization is terminated  or revoked sooner.       Influenza A by PCR NEGATIVE NEGATIVE Final   Influenza B by PCR NEGATIVE NEGATIVE Final    Comment: (NOTE) The Xpert Xpress SARS-CoV-2/FLU/RSV plus assay is intended as an aid in the diagnosis of influenza from Nasopharyngeal swab specimens and should not be used as a sole basis for treatment. Nasal washings and aspirates are unacceptable for Xpert Xpress SARS-CoV-2/FLU/RSV testing.  Fact Sheet for Patients: BloggerCourse.comhttps://www.fda.gov/media/152166/download  Fact Sheet for Healthcare Providers: SeriousBroker.ithttps://www.fda.gov/media/152162/download  This test is not yet approved or cleared by the [place] FDA and has been authorized for detection and/or diagnosis of SARS-CoV-2 by FDA under an Emergency Use Authorization (EUA). This EUA will remain in effect (meaning this test can be used) for the duration of the COVID-19 declaration under Section 564(b)(1) of the Act, 21 U.S.C. section 360bbb-3(b)(1), unless the authorization is terminated or revoked.  Performed at [facility], [address] [place], [place] [address]   MRSA PCR Screening     Status: None   Collection Time: 10/19/20 11:11 AM   Specimen: Nasopharyngeal  Result Value Ref Range Status   MRSA by PCR NEGATIVE NEGATIVE Final    Comment:        The GeneXpert MRSA Assay  (FDA approved for NASAL specimens only), is one component of a comprehensive MRSA colonization surveillance program. It is not intended to diagnose MRSA infection nor to guide or monitor treatment for MRSA infections. Performed at [facility], [address] [place], [place] [address]          Radiology Studies: NM GI Blood Loss  Result Date: 10/19/2020 CLINICAL DATA:  GI bleeding EXAM: NUCLEAR MEDICINE GASTROINTESTINAL BLEEDING SCAN TECHNIQUE: Sequential abdominal images were obtained following intravenous administration of Tc-857m labeled red blood cells. RADIOPHARMACEUTICALS:  23.6 mCi Tc-617m pertechnetate in-vitro labeled red cells. COMPARISON:  None. FINDINGS: Increased radiotracer activity in the left upper quadrant  is suspected to reflect free pertechnetate with resulting accumulation in the normal gastric mucosa. Small amount of activity is noted about the left antecubital fossa likely related to injection site. No other conspicuous sites of radiotracer accumulation are seen in the abdomen or pelvis, specifically no convincing evidence of active GI bleeding. IMPRESSION: No convincing tracer accumulation to suggest a identifiable sites for active GI bleeding. Likely artifactually accumulation within the gastric mucosa often related to free pertechnetate. Activity projects towards the left antecubital fossa likely at the injection site. Electronically Signed   By: Kreg Shropshire M.D.   On: 10/19/2020 19:31        Scheduled Meds: . chlorhexidine  15 mL Mouth Rinse BID  . Chlorhexidine Gluconate Cloth  6 each Topical Daily  . docusate sodium  100 mg Oral BID  . feeding supplement  237 mL Oral BID BM  . fluticasone  1 spray Each Nare Daily  . mouth rinse  15 mL Mouth Rinse q12n4p  . multivitamin with minerals  1 tablet Oral Daily  . polyethylene glycol  17 g Oral BID  . sertraline  75 mg Oral Daily   Continuous Infusions: . sodium chloride 75 mL/hr at  10/20/20 0209  . lactated ringers 10 mL/hr at 10/20/20 0946  . pantoprozole (PROTONIX) infusion 8 mg/hr (10/20/20 0444)     LOS: 3 days   Alwyn Ren, MD  10/20/2020, 12:48 PM

## 2020-10-20 NOTE — Anesthesia Procedure Notes (Signed)
Procedure Name: [place] Performed by: West Pugh, CRNA Pre-anesthesia Checklist: Patient identified, Emergency Drugs available, Suction available, Patient being monitored and Timeout performed Patient Re-evaluated:Patient Re-evaluated prior to induction Oxygen Delivery Method: Simple face mask Preoxygenation: Pre-oxygenation with 100% oxygen Induction Type: IV induction Placement Confirmation: positive ETCO2

## 2020-10-20 NOTE — Op Note (Signed)
[facility] Patient Name: Karen Mendez Procedure Date: 10/20/2020 MRN: [id number] Attending MD: Bernette Redbird , MD Date of Birth: Apr 27, 1923 CSN: [id number] Age: 87 Admit Type: Inpatient Procedure:                Upper GI endoscopy Indications:              Acute post hemorrhagic anemia, Hematochezia Providers:                Bernette Redbird, MD, Clearnce Sorrel, RN, Leanne Lovely, Technician, Kym Groom, CRNA Referring MD:              Medicines:                Monitored Anesthesia Care Complications:            No immediate complications. Estimated Blood Loss:     Estimated blood loss was minimal. Procedure:                Pre-Anesthesia Assessment:                           - Prior to the procedure, a History and Physical                            was performed, and patient medications and                            allergies were reviewed. The patient's tolerance of                            previous anesthesia was also reviewed. The risks                            and benefits of the procedure and the sedation                            options and risks were discussed with the patient.                            All questions were answered, and informed consent                            was obtained. Prior Anticoagulants: The patient has                            taken Lovenox (enoxaparin), last dose was 3 days                            prior to procedure. ASA Grade Assessment: III - A                            patient with severe systemic disease. After  reviewing the risks and benefits, the patient was                            deemed in satisfactory condition to undergo the                            procedure.                           After obtaining informed consent, the endoscope was                            passed under direct vision. Throughout the                            procedure, the  patient's blood pressure, pulse, and                            oxygen saturations were monitored continuously. The                            GIF-[id number] ([id number]) was introduced through the                            mouth, and advanced to the second part of duodenum.                            The upper GI endoscopy was accomplished without                            difficulty. The patient tolerated the procedure                            well. Scope In: Scope Out: Findings:      The larynx was normal.      The examined esophagus was normal.      A single 9 mm semi-sessile polyp was found in the gastric body. Biopsies       were taken with a cold forceps for histology.      Prominent gastric folds were found in the cardia. Biopsies were taken       with a cold forceps for histology. Estimated blood loss was minimal.      The exam of the stomach was otherwise normal.      The cardia and gastric fundus were normal on retroflexion.      There is no endoscopic evidence of bleeding, erythema or inflammatory       changes suggestive of gastritis or ulceration in the entire examined       stomach.      The examined duodenum was normal.      There is no endoscopic evidence of bleeding, inflammation or ulceration       in the entire examined duodenum. Impression:               - No bleeding or prospective bleeding site seen at  the time of this exam.                           - Normal larynx.                           - Normal esophagus.                           - A single gastric polyp. Biopsied.                           - Enlarged gastric fold in proximal                            stomach--probably not clinically significant.                            Biopsied.                           - Normal examined duodenum. Moderate Sedation:      This patient was sedated with monitored anesthesia care, not moderate       sedation. Recommendation:           -  Consider CT scan (computed tomography) of the                            abdomen with oral contrast to evaluate for                            alternative sources of bleeding such as                            diverticulosis, vs. colonoscopy. Procedure Code(s):        --- Professional ---                           [id number], Esophagogastroduodenoscopy, flexible,                            transoral; with biopsy, single or multiple Diagnosis Code(s):        --- Professional ---                           K31.7, Polyp of stomach and duodenum                           K29.60, Other gastritis without bleeding                           D62, Acute posthemorrhagic anemia                           K92.1, Melena (includes Hematochezia) CPT copyright 2019 [employer] Medical [employer]. All rights reserved. The codes documented in this report are preliminary and upon coder review may  be revised to meet current compliance requirements. Bernette Redbirdobert Chaniyah Jahr, MD 10/20/2020  10:18:54 AM This report has been signed electronically. Number of Addenda: 0

## 2020-10-20 NOTE — Transfer of Care (Signed)
Immediate Anesthesia Transfer of Care Note  Patient: Karen Mendez  Procedure(s) Performed: ESOPHAGOGASTRODUODENOSCOPY (EGD) (N/A ) BIOPSY  Patient Location: PACU  Anesthesia Type:MAC  Level of Consciousness: awake, alert  and patient cooperative  Airway & Oxygen Therapy: Patient Spontanous Breathing and Patient connected to face mask oxygen  Post-op Assessment: Report given to RN and Post -op Vital signs reviewed and stable  Post vital signs: Reviewed and stable  Last Vitals:  Vitals Value Taken Time  BP    Temp    Pulse    Resp    SpO2      Last Pain:  Vitals:   10/20/20 0820  TempSrc: Oral  PainSc:       Patients Stated Pain Goal: 2 (92/49/32 4199)  Complications: No complications documented.

## 2020-10-20 NOTE — Anesthesia Preprocedure Evaluation (Signed)
Anesthesia Evaluation  Patient identified by MRN, date of birth, ID band Patient awake    Reviewed: Allergy & Precautions, NPO status , Patient's Chart, lab work & pertinent test results  Airway Mallampati: II  TM Distance: >3 FB Neck ROM: Full    Dental no notable dental hx.    Pulmonary neg pulmonary ROS,    Pulmonary exam normal breath sounds clear to auscultation       Cardiovascular hypertension, + Valvular Problems/Murmurs MR  Rhythm:Regular Rate:Normal + Systolic murmurs    Neuro/Psych negative neurological ROS  negative psych ROS   GI/Hepatic negative GI ROS, Neg liver ROS,   Endo/Other  negative endocrine ROS  Renal/GU negative Renal ROS  negative genitourinary   Musculoskeletal negative musculoskeletal ROS (+)   Abdominal   Peds negative pediatric ROS (+)  Hematology negative hematology ROS (+) anemia ,   Anesthesia Other Findings   Reproductive/Obstetrics negative OB ROS                             Anesthesia Physical Anesthesia Plan  ASA: III  Anesthesia Plan: MAC   Post-op Pain Management:    Induction: Intravenous  PONV Risk Score and Plan: 2 and Propofol infusion and Treatment may vary due to age or medical condition  Airway Management Planned: Simple Face Mask  Additional Equipment:   Intra-op Plan:   Post-operative Plan:   Informed Consent: I have reviewed the patients History and Physical, chart, labs and discussed the procedure including the risks, benefits and alternatives for the proposed anesthesia with the patient or authorized representative who has indicated his/her understanding and acceptance.     Dental advisory given  Plan Discussed with: CRNA and Surgeon  Anesthesia Plan Comments:         Anesthesia Quick Evaluation

## 2020-10-20 NOTE — Progress Notes (Signed)
PT Cancellation Note  Patient Details Name: MELBA ARAKI MRN: [id number] DOB: 06-Nov-1922   Cancelled Treatment:      PT Cancellation Note  __x_Treatment cancelled today due to medical issues with patient which prohibited therapy  __x_ Treatment cancelled today due to patient receiving procedure or test   ___ Treatment cancelled today due to patient's refusal to participate   ___ Treatment cancelled today due to       Armando Reichert 10/20/2020, 3:49 PM

## 2020-10-20 NOTE — Progress Notes (Signed)
Date and time results received: 10/20/20 0116 (use smartphrase ".now" to insert current time)  Test: Hgb Critical Value: 6.7  Name of Provider Notified: Dr. Leonard Schwartz Chotiner Orders Received? Or Actions Taken?   PRBC orders to follow shortly

## 2020-10-20 NOTE — Interval H&P Note (Signed)
History and Physical Interval Note:  10/20/2020 9:37 AM  Karen Mendez  has presented today for surgery, with the diagnosis of rectal bleeding.  The various methods of treatment have been discussed with the patient. After consideration of risks, benefits and other options for treatment, the patient has consented to  Procedure(s): ESOPHAGOGASTRODUODENOSCOPY (EGD) (N/A) as a surgical intervention.  The patient's history has been reviewed, patient examined, no change in status, stable for surgery.  I have reviewed the patient's chart and labs.  Questions were answered to the patient's satisfaction.     Katy Fitch Luna Audia

## 2020-10-20 NOTE — Progress Notes (Addendum)
Upper endoscopy was well-tolerated, but was negative for any source of bleeding.  Therefore, despite the patient's elevated BUN, I am inclined to suspect either a small bowel source or a lower tract source for her bleeding.  I will obtain a CT of the abdomen and pelvis today, looking for small bowel tumors or colonic diverticulosis.  Depending on those findings, a capsule endoscopy of the small intestine may be needed.  Conceivably, we could consider a colonoscopy in this patient but given her advanced age, I think the risk would be increased and I would like to get by without that if possible, especially because the likelihood of detecting an endoscopically treatable lesion in the lower GI tract, such as a discrete bleeding colonic ulceration, is quite low.  I have spoken by telephone with the patient's daughter, Efraim Kaufmann, concerning the above.  She is in agreement with the plan.  In the meantime, I will give some MiraLAX to help purge blood out of the GI tract, which will help [place] be prepared if we need to do either a capsule endoscopy or a colonoscopy, for optimal visualization.  Florencia Reasons, M.D. Pager [phone] If no answer or after 5 PM call [phone]

## 2020-10-20 NOTE — Progress Notes (Signed)
Notified by RN that Hgb now 6.7. Had PRBC yesterday afternoon. Admitted with GI bleed and hip fracture  Transfuse 2 units PRBC now. Orders placed

## 2020-10-20 NOTE — Progress Notes (Signed)
  Echocardiogram 2D Echocardiogram has been performed.  Roosvelt Maser F 10/20/2020, 2:22 PM

## 2020-10-20 NOTE — Progress Notes (Signed)
450cc maroon stool. CBC sent now, results pending

## 2020-10-20 NOTE — Anesthesia Postprocedure Evaluation (Signed)
Anesthesia Post Note  Patient: Karen Mendez  Procedure(s) Performed: ESOPHAGOGASTRODUODENOSCOPY (EGD) (N/A ) BIOPSY     Patient location during evaluation: PACU Anesthesia Type: MAC Level of consciousness: awake and alert Pain management: pain level controlled Vital Signs Assessment: post-procedure vital signs reviewed and stable Respiratory status: spontaneous breathing, nonlabored ventilation, respiratory function stable and patient connected to nasal cannula oxygen Cardiovascular status: stable and blood pressure returned to baseline Postop Assessment: no apparent nausea or vomiting Anesthetic complications: no   No complications documented.  Last Vitals:  Vitals:   10/20/20 1030 10/20/20 1136  BP: (!) 166/34   Pulse: 69   Resp: 13   Temp:  (!) 36.4 C  SpO2: 100%     Last Pain:  Vitals:   10/20/20 1136  TempSrc: Oral  PainSc:                  [name] S

## 2020-10-21 ENCOUNTER — Encounter (HOSPITAL_COMMUNITY)
Admission: EM | Disposition: A | Discharge status: Skilled Nursing Facility | Payer: Self-pay | Source: Skilled Nursing Facility | Attending: Internal Medicine

## 2020-10-21 ENCOUNTER — Encounter (HOSPITAL_COMMUNITY): Payer: Self-pay | Admitting: Gastroenterology

## 2020-10-21 DIAGNOSIS — I5032 Chronic diastolic (congestive) heart failure: Secondary | ICD-10-CM

## 2020-10-21 DIAGNOSIS — K922 Gastrointestinal hemorrhage, unspecified: Secondary | ICD-10-CM | POA: Diagnosis not present

## 2020-10-21 DIAGNOSIS — I10 Essential (primary) hypertension: Secondary | ICD-10-CM | POA: Diagnosis not present

## 2020-10-21 DIAGNOSIS — E782 Mixed hyperlipidemia: Secondary | ICD-10-CM | POA: Diagnosis not present

## 2020-10-21 DIAGNOSIS — S72001D Fracture of unspecified part of neck of right femur, subsequent encounter for closed fracture with routine healing: Secondary | ICD-10-CM

## 2020-10-21 DIAGNOSIS — D62 Acute posthemorrhagic anemia: Secondary | ICD-10-CM

## 2020-10-21 DIAGNOSIS — E870 Hyperosmolality and hypernatremia: Secondary | ICD-10-CM

## 2020-10-21 DIAGNOSIS — N179 Acute kidney failure, unspecified: Secondary | ICD-10-CM

## 2020-10-21 HISTORY — PX: GIVENS CAPSULE STUDY: SHX5432

## 2020-10-21 LAB — BPAM RBC
Blood Product Expiration Date: 202206062359
Blood Product Expiration Date: 202206112359
Blood Product Expiration Date: 202206112359
Blood Product Expiration Date: 202206182359
Blood Product Expiration Date: 202206182359
ISSUE DATE / TIME: 202205221542
ISSUE DATE / TIME: 202205231212
ISSUE DATE / TIME: 202205231438
ISSUE DATE / TIME: 202205240224
ISSUE DATE / TIME: 202205240423
Unit Type and Rh: 6200
Unit Type and Rh: 6200
Unit Type and Rh: 6200
Unit Type and Rh: 6200
Unit Type and Rh: 6200

## 2020-10-21 LAB — TYPE AND SCREEN
ABO/RH(D): A POS
Antibody Screen: NEGATIVE
Unit division: 0
Unit division: 0
Unit division: 0
Unit division: 0
Unit division: 0

## 2020-10-21 LAB — CBC WITH DIFFERENTIAL/PLATELET
Abs Immature Granulocytes: 0.16 10*3/uL — ABNORMAL HIGH (ref 0.00–0.07)
Basophils Absolute: 0 10*3/uL (ref 0.0–0.1)
Basophils Relative: 0 %
Eosinophils Absolute: 0.3 10*3/uL (ref 0.0–0.5)
Eosinophils Relative: 3 %
HCT: 27.4 % — ABNORMAL LOW (ref 36.0–46.0)
Hemoglobin: 9.1 g/dL — ABNORMAL LOW (ref 12.0–15.0)
Immature Granulocytes: 2 %
Lymphocytes Relative: 19 %
Lymphs Abs: 2 10*3/uL (ref 0.7–4.0)
MCH: 30.6 pg (ref 26.0–34.0)
MCHC: 33.2 g/dL (ref 30.0–36.0)
MCV: 92.3 fL (ref 80.0–100.0)
Monocytes Absolute: 0.7 10*3/uL (ref 0.1–1.0)
Monocytes Relative: 7 %
Neutro Abs: 7.2 10*3/uL (ref 1.7–7.7)
Neutrophils Relative %: 69 %
Platelets: 171 10*3/uL (ref 150–400)
RBC: 2.97 MIL/uL — ABNORMAL LOW (ref 3.87–5.11)
RDW: 18.2 % — ABNORMAL HIGH (ref 11.5–15.5)
WBC: 10.4 10*3/uL (ref 4.0–10.5)
nRBC: 0.4 % — ABNORMAL HIGH (ref 0.0–0.2)

## 2020-10-21 LAB — BASIC METABOLIC PANEL
Anion gap: 5 (ref 5–15)
BUN: 27 mg/dL — ABNORMAL HIGH (ref 8–23)
CO2: 25 mmol/L (ref 22–32)
Calcium: 7.5 mg/dL — ABNORMAL LOW (ref 8.9–10.3)
Chloride: 113 mmol/L — ABNORMAL HIGH (ref 98–111)
Creatinine, Ser: 0.89 mg/dL (ref 0.44–1.00)
GFR, Estimated: 59 mL/min — ABNORMAL LOW (ref >60)
Glucose, Bld: 97 mg/dL (ref 70–99)
Potassium: 3.6 mmol/L (ref 3.5–5.1)
Sodium: 143 mmol/L (ref 135–145)

## 2020-10-21 LAB — MAGNESIUM: Magnesium: 2 mg/dL (ref 1.7–2.4)

## 2020-10-21 SURGERY — IMAGING PROCEDURE, GI TRACT, INTRALUMINAL, VIA CAPSULE
Anesthesia: LOCAL

## 2020-10-21 MED ORDER — ALUM & MAG HYDROXIDE-SIMETH 200-200-20 MG/5ML PO SUSP
30.0000 mL | Freq: Once | ORAL | Status: AC
  Administered 2020-10-21: 30 mL via ORAL
  Filled 2020-10-21: qty 30

## 2020-10-21 MED ORDER — LIDOCAINE VISCOUS HCL 2 % MT SOLN
15.0000 mL | Freq: Four times a day (QID) | OROMUCOSAL | Status: DC | PRN
  Filled 2020-10-21: qty 15

## 2020-10-21 MED ORDER — LIDOCAINE VISCOUS HCL 2 % MT SOLN
15.0000 mL | Freq: Once | OROMUCOSAL | Status: AC
  Administered 2020-10-21: 15 mL via ORAL
  Filled 2020-10-21: qty 15

## 2020-10-21 MED ORDER — ALUM & MAG HYDROXIDE-SIMETH 200-200-20 MG/5ML PO SUSP: 30.0000 mL | Freq: Four times a day (QID) | ORAL | Status: DC | PRN

## 2020-10-21 SURGICAL SUPPLY — 1 items: TOWEL COTTON PACK 4EA (MISCELLANEOUS) ×4 IMPLANT

## 2020-10-21 NOTE — Progress Notes (Signed)
RN reviewed capsule study procedure with patient who stated understanding.  RN applied all capsule study leads in appropriate locations.  RN activated capsule study device with pill camera.  RN monitored as patient swallowed capsule.  RN reviewed diet instructions for the next 8 hours.  Patient stated understanding.    GI RN provided bedside RN with diet instructions as well as instructions about removing all leads in 12 hours, placing the device and leads in a patient belongings bag for GI RN to pick up Thursday morning.

## 2020-10-21 NOTE — TOC Progression Note (Signed)
Transition of Care [facility]) - Progression Note    Patient Details  Name: Karen Mendez MRN: [id number] Date of Birth: 02-01-1923  [facility] CM/SW Contact  Golda Acre, RN Phone Number: 10/21/2020, 7:46 AM  Clinical Narrative:    tct-Daughter Wonda Cheng at [phone] of accepted bed given to daughter wbc after she has researched these and discussed with family.   Expected Discharge Plan: Skilled Nursing Facility Barriers to Discharge: Continued Medical Work up,SNF Pending bed offer  Expected Discharge Plan and Services Expected Discharge Plan: Skilled Nursing Facility In-house Referral: Clinical Social Work   Post Acute Care Choice: Skilled Nursing Facility Living arrangements for the past 2 months: Assisted Living Facility                 DME Arranged: N/A DME Agency: NA                   Social Determinants of Health (SDOH) Interventions    Readmission Risk Interventions No flowsheet data found.

## 2020-10-21 NOTE — Progress Notes (Signed)
PROGRESS NOTE    Karen Mendez  ZOX:[id number] DOB: [birth year] DOA: 10/17/2020 PCP: [name], MD    Chief Complaint  Patient presents with  . Fall    Brief Narrative:   87 year old female lives at [facility] status post mechanical fall and right hip fracture.  She is now status post repair.  Her past medical history includes hypertension fibromyalgia recurrent falls hyperlipidemia and degenerative joint disease.  Patient reports that she has not walked much for the past year after she had a fall with several rib fractures and uses wheelchair to get around.  10/20/2020 summary-patient was found to have large amounts of loose dark stools which was guaiac positive.  She was transfused 5 units of packed RBC, bite of that her expected.  She had a tagged RBC scan which was negative.  EGD done on 10/20/2020 which showed no signs of active bleeding.  Hb 10 from 6.7 (she received 5 units prbc)   Assessment & Plan:   Principal Problem:   Hip fracture (HCC) Active Problems:   Essential hypertension   Mixed hyperlipidemia   Fall   Gastrointestinal hemorrhage   ABLA (acute blood loss anemia)   Chronic diastolic CHF (congestive heart failure) (HCC)   Hypernatremia   AKI (acute kidney injury) (HCC)   1 nondisplaced right femoral neck fracture and displaced fractures to the medial right inferior pubic ramus status post mechanical fall -Patient seen in consultation by orthopedics. -Patient underwent closed reduction and percutaneous screw fixation of right femoral neck fracture per orthopedics.- -WBAT per orthopedics -Continue scheduled Tylenol. -Limit narcotics. -PT recommending SNF on discharge. -Outpatient follow-up with orthopedics for wound check and suture removal and x-rays.  2.  Acute GI bleed/acute blood loss anemia -Questionable etiology. -Status post transfusion 5 units packed red blood cells during this hospitalization. -Hemoglobin currently at 9.1. -Status post  IV Feraheme. -Anemia panel from 11/04/2019 with iron level of 74, TIBC of 245, folate of 8.8. -CT abdomen and pelvis which was done negative for any masses or diverticulosis -Bleeding scan negative for any source of bleeding. -Upper endoscopy negative for any source of bleeding. -Patient being followed by GI and patient for capsule endoscopy later on today -Patient also tentatively for colonoscopy tomorrow to discern whether there is any active bleeding. -Continue Protonix drip. -Per GI.  3.  Hypertension -BP stable. -Lasix on hold. -Coreg on hold.  4.  History of chronic diastolic CHF -Currently euvolemic more on the dry side. -Continue to hold diuretics.  5.  Hyperlipidemia -Not on statin prior to admission. -Outpatient follow-up.  6.  Acute kidney injury -Resolved.  7.  Hypernatremia -Improved with change of IV fluids.    DVT prophylaxis: SCDs Code Status: DNR Family Communication: Updated patient.  No family at bedside Disposition:   Status is: Inpatient    Dispo: The patient is from: [facility] facility              Anticipated d/c is to: Likely SNF              Patient currently undergoing work-up for GI bleed, status posttransfusion 5 units packed red blood cells.  Not stable for discharge.   Difficult to place patient no       Consultants:   Orthopedics: Dr.Adair 10/17/2020  Gastroenterology: Dr. Matthias Hughs 10/19/2020  Procedures:   CT abdomen and pelvis 10/20/2020  Tagged RBC bleeding scan 10/19/2020  2D echo 10/20/2020  Plain films of the right hip and pelvis 10/17/2020  Upper endoscopy per  Dr. Matthias Hughs 10/20/2020  Close reduction percutaneous screw fixation of right femoral neck fracture per Dr. Susa Simmonds, orthopedics 10/17/2020  Transfusion 5 units packed red blood cells 5/22-5/24/2022  Antimicrobials:   IV clindamycin 10/17/2020>>>> 10/18/2020   Subjective: Patient laying in bed.  Patient with complaints of chest burning after taking a bunch of  pills this morning.  No shortness of breath.  No abdominal pain.  Noted to have a large bowel movement overnight which patient states she thinks may have been black.  Had bowel movement this morning which per RN was reported to be black around shift change.  Repeat blood pressure manually 142/58.  Objective: Vitals:   10/21/20 1405 10/21/20 1500 10/21/20 1600 10/21/20 1724  BP:  (!) 165/37 (!) 154/37   Pulse:  81 88   Resp:  13 13   Temp: 97.8 F (36.6 C)   98 F (36.7 C)  TempSrc: Oral   Oral  SpO2:  94% 91%   Weight:      Height:        Intake/Output Summary (Last 24 hours) at 10/21/2020 1750 Last data filed at 10/21/2020 1100 Gross per 24 hour  Intake 1571.33 ml  Output 700 ml  Net 871.33 ml   Filed Weights   10/20/20 0458 10/20/20 0820 10/21/20 0500  Weight: 57.2 kg 57.2 kg 57.2 kg    Examination:  General exam: Appears calm and comfortable  Respiratory system: Clear to auscultation. Respiratory effort normal. Cardiovascular system: S1 & S2 heard, RRR. No JVD, murmurs, rubs, gallops or clicks. No pedal edema. Gastrointestinal system: Abdomen is nondistended, soft and nontender. No organomegaly or masses felt. Normal bowel sounds heard. Central nervous system: Alert and oriented. No focal neurological deficits. Extremities: Symmetric 5 x 5 power. Skin: No rashes, lesions or ulcers Psychiatry: Judgement and insight appear normal. Mood & affect appropriate.     Data Reviewed: I have personally reviewed following labs and imaging studies  CBC: Recent Labs  Lab 10/17/20 0955 10/18/20 0738 10/19/20 0740 10/20/20 0053 10/20/20 1126 10/20/20 1431 10/20/20 2036 10/21/20 1013  WBC 10.7* 8.8   < > 11.6* 11.2* 12.0* 11.8* 10.4  NEUTROABS 8.9* 7.0  --   --   --   --   --  7.2  HGB 10.5* 7.4*   < > 6.7* 10.0* 10.2* 9.8* 9.1*  HCT 32.2* 24.2*   < > 19.9* 30.1* 30.2* 29.2* 27.4*  MCV 103.2* 109.5*   < > 96.6 90.1 91.0 91.5 92.3  PLT 190 174   < > 162 147* 141* 166 171    < > = values in this interval not displayed.    Basic Metabolic Panel: Recent Labs  Lab 10/17/20 0955 10/18/20 0322 10/19/20 0740 10/20/20 1126 10/21/20 1013  NA 141 142 142 147* 143  K 4.5 4.7 4.4 4.2 3.6  CL 106 110 115* 117* 113*  CO2 GLUCOSE 111* 122* 122* 131* 97  BUN 41* 53* 51* 42* 27*  CREATININE 0.90 1.26* 1.10* 0.96 0.89  CALCIUM 8.8* 7.8* 7.4* 7.4* 7.5*  MG  --   --   --   --  2.0    GFR: Estimated Creatinine Clearance: 27.9 mL/min (by C-G formula based on SCr of 0.89 mg/dL).  Liver Function Tests: No results for input(s): AST, ALT, ALKPHOS, BILITOT, PROT, ALBUMIN in the last 168 hours.  CBG: No results for input(s): GLUCAP in the last 168 hours.   Recent Results (from the past 240 hour(s))  Resp Panel by RT-PCR (Flu A&B, Covid) Nasopharyngeal Swab     Status: None   Collection Time: 10/17/20  9:55 AM   Specimen: Nasopharyngeal Swab; Nasopharyngeal(NP) swabs in vial transport medium  Result Value Ref Range Status   SARS Coronavirus 2 by RT PCR NEGATIVE NEGATIVE Final    Comment: (NOTE) SARS-CoV-2 target nucleic acids are NOT DETECTED.  The SARS-CoV-2 RNA is generally detectable in upper respiratory specimens during the acute phase of infection. The lowest concentration of SARS-CoV-2 viral copies this assay can detect is 138 copies/mL. A negative result does not preclude SARS-Cov-2 infection and should not be used as the sole basis for treatment or other patient management decisions. A negative result may occur with  improper specimen collection/handling, submission of specimen other than nasopharyngeal swab, presence of viral mutation(s) within the areas targeted by this assay, and inadequate number of viral copies(<138 copies/mL). A negative result must be combined with clinical observations, patient history, and epidemiological information. The expected result is Negative.  Fact Sheet for Patients:   BloggerCourse.com  Fact Sheet for Healthcare Providers:  SeriousBroker.it  This test is no t yet approved or cleared by the [place] FDA and  has been authorized for detection and/or diagnosis of SARS-CoV-2 by FDA under an Emergency Use Authorization (EUA). This EUA will remain  in effect (meaning this test can be used) for the duration of the COVID-19 declaration under Section 564(b)(1) of the Act, 21 U.S.C.section 360bbb-3(b)(1), unless the authorization is terminated  or revoked sooner.       Influenza A by PCR NEGATIVE NEGATIVE Final   Influenza B by PCR NEGATIVE NEGATIVE Final    Comment: (NOTE) The Xpert Xpress SARS-CoV-2/FLU/RSV plus assay is intended as an aid in the diagnosis of influenza from Nasopharyngeal swab specimens and should not be used as a sole basis for treatment. Nasal washings and aspirates are unacceptable for Xpert Xpress SARS-CoV-2/FLU/RSV testing.  Fact Sheet for Patients: BloggerCourse.com  Fact Sheet for Healthcare Providers: SeriousBroker.it  This test is not yet approved or cleared by the [place] FDA and has been authorized for detection and/or diagnosis of SARS-CoV-2 by FDA under an Emergency Use Authorization (EUA). This EUA will remain in effect (meaning this test can be used) for the duration of the COVID-19 declaration under Section 564(b)(1) of the Act, 21 U.S.C. section 360bbb-3(b)(1), unless the authorization is terminated or revoked.  Performed at [facility], [address] [place], [place] [address]   MRSA PCR Screening     Status: None   Collection Time: 10/19/20 11:11 AM   Specimen: Nasopharyngeal  Result Value Ref Range Status   MRSA by PCR NEGATIVE NEGATIVE Final    Comment:        The GeneXpert MRSA Assay (FDA approved for NASAL specimens only), is one component of a comprehensive MRSA  colonization surveillance program. It is not intended to diagnose MRSA infection nor to guide or monitor treatment for MRSA infections. Performed at [facility], [address], [place], [place] [address]          Radiology Studies: CT ABDOMEN PELVIS WO CONTRAST  Result Date: 10/20/2020 CLINICAL DATA:  87 year old with abdominal pain.  GI bleeding. EXAM: CT ABDOMEN AND PELVIS WITHOUT CONTRAST TECHNIQUE: Multidetector CT imaging of the abdomen and pelvis was performed following the standard protocol without IV contrast. COMPARISON:  Nuclear medicine tagged red blood cell study 10/19/2020 FINDINGS: Lower chest: Trace pleural fluid, right side greater than left. Mild bronchiectasis  in the lower lobes. Patchy densities at lung bases likely represent atelectasis or mild scarring. Hepatobiliary: No gross abnormality to the liver. Normal appearance of the gallbladder with mild distention. Pancreas: Unremarkable. No pancreatic ductal dilatation or surrounding inflammatory changes. Spleen: Normal in size without focal abnormality. Adrenals/Urinary Tract: Normal appearance of the adrenal glands. Mild distention of the urinary bladder with fluid. Normal appearance of the kidneys without hydronephrosis. No evidence for kidney stones. Stomach/Bowel: Oral contrast in the distal small bowel and colon. No evidence for obstruction. Normal appearance of the stomach. No focal bowel inflammation. Vascular/Lymphatic: Diffuse vascular calcifications throughout the abdomen and pelvis. Negative for an aortic aneurysm. Large number of mesenteric lymph nodes throughout the central abdomen. These prominent mesenteric lymph nodes are best seen on the coronal images, sequence 5 image 24 and on the axial images, sequence 2 image 42. No significant lymph node enlargement around the aorta or iliac vessels. No significant inguinal lymph node enlargement. Reproductive: Uterus appears to be surgically absent. No  evidence for an adnexal mass. Other: Diffuse subcutaneous edema. Evidence for presacral edema. Negative for free air. Trace fluid in the right paracolic gutter. Musculoskeletal: 3 surgical screws extending through the proximal right femur. Fractures involving the right superior and inferior pubic rami. Mild anterolisthesis at L4-L5. Extensive facet arthropathy lumbar spine. Minimal retrolisthesis at L3-L4. Multilevel disc space narrowing and endplate disease in the lumbar spine. IMPRESSION: 1. No acute abnormality to explain abdominal pain. 2. Trace pleural fluid with subcutaneous edema and minimal fluid in the right paracolic gutter. 3. Prominent central mesenteric lymph nodes. Findings are nonspecific. This could represent reactive changes. Mild lymphoproliferative process cannot be completely excluded. 4. Right pubic rami fracture. Postoperative changes from right hip fracture and surgical fixation. 5.  Aortic Atherosclerosis (ICD10-I70.0). Electronically Signed   By: Richarda OverlieAdam  Henn M.D.   On: 10/20/2020 17:15   NM GI Blood Loss  Result Date: 10/19/2020 CLINICAL DATA:  GI bleeding EXAM: NUCLEAR MEDICINE GASTROINTESTINAL BLEEDING SCAN TECHNIQUE: Sequential abdominal images were obtained following intravenous administration of Tc-2762m labeled red blood cells. RADIOPHARMACEUTICALS:  23.6 mCi Tc-662m pertechnetate in-vitro labeled red cells. COMPARISON:  None. FINDINGS: Increased radiotracer activity in the left upper quadrant is suspected to reflect free pertechnetate with resulting accumulation in the normal gastric mucosa. Small amount of activity is noted about the left antecubital fossa likely related to injection site. No other conspicuous sites of radiotracer accumulation are seen in the abdomen or pelvis, specifically no convincing evidence of active GI bleeding. IMPRESSION: No convincing tracer accumulation to suggest a identifiable sites for active GI bleeding. Likely artifactually accumulation within the  gastric mucosa often related to free pertechnetate. Activity projects towards the left antecubital fossa likely at the injection site. Electronically Signed   By: Kreg ShropshirePrice  DeHay M.D.   On: 10/19/2020 19:31   ECHOCARDIOGRAM COMPLETE  Result Date: 10/20/2020    ECHOCARDIOGRAM REPORT   Patient Name:   Karen Mendez Date of Exam: 10/20/2020 Medical Rec #:  [id number]       Height:       61.0 in Accession #:    [id number]      Weight:       126.1 lb Date of Birth:  08/27/1922        BSA:          1.553 m Patient Age:    89 years        BP:           165/38 mmHg Patient Gender:  F               HR:           83 bpm. Exam Location:  Inpatient Procedure: 2D Echo, Cardiac Doppler and Color Doppler Indications:    R94.31 Abnormal EKG  History:        Patient has prior history of Echocardiogram examinations, most                 recent 11/05/2019. Risk Factors:Hypertension and Dyslipidemia.  Sonographer:    Roosvelt Maser RDCS Referring Phys: [id number] ELIZABETH G MATHEWS IMPRESSIONS  1. Left ventricular ejection fraction, by estimation, is 55 to 60%. The left ventricle has normal function. The left ventricle has no regional wall motion abnormalities. Left ventricular diastolic parameters are consistent with Grade I diastolic dysfunction (impaired relaxation).  2. Right ventricular systolic function is normal. The right ventricular size is normal.  3. The mitral valve is normal in structure. Moderate mitral valve regurgitation. No evidence of mitral stenosis. Moderate mitral annular calcification.  4. The aortic valve is normal in structure. Aortic valve regurgitation is not visualized. No aortic stenosis is present.  5. The inferior vena cava is normal in size with greater than 50% respiratory variability, suggesting right atrial pressure of 3 mmHg. FINDINGS  Left Ventricle: Left ventricular ejection fraction, by estimation, is 55 to 60%. The left ventricle has normal function. The left ventricle has no regional wall motion  abnormalities. The left ventricular internal cavity size was normal in size. There is  no left ventricular hypertrophy. Left ventricular diastolic parameters are consistent with Grade I diastolic dysfunction (impaired relaxation). Right Ventricle: The right ventricular size is normal. No increase in right ventricular wall thickness. Right ventricular systolic function is normal. Left Atrium: Left atrial size was normal in size. Right Atrium: Right atrial size was normal in size. Pericardium: There is no evidence of pericardial effusion. Mitral Valve: The mitral valve is normal in structure. Moderate mitral annular calcification. Moderate mitral valve regurgitation. No evidence of mitral valve stenosis. Tricuspid Valve: The tricuspid valve is normal in structure. Tricuspid valve regurgitation is not demonstrated. No evidence of tricuspid stenosis. Aortic Valve: The aortic valve is normal in structure. Aortic valve regurgitation is not visualized. No aortic stenosis is present. Aortic valve mean gradient measures 6.0 mmHg. Aortic valve peak gradient measures 10.9 mmHg. Aortic valve area, by VTI measures 1.11 cm. Pulmonic Valve: The pulmonic valve was normal in structure. Pulmonic valve regurgitation is not visualized. No evidence of pulmonic stenosis. Aorta: The aortic root is normal in size and structure. Venous: The inferior vena cava is normal in size with greater than 50% respiratory variability, suggesting right atrial pressure of 3 mmHg. IAS/Shunts: No atrial level shunt detected by color flow Doppler.  LEFT VENTRICLE PLAX 2D LVIDd:         3.53 cm  Diastology LVIDs:         2.44 cm  LV e' medial:    3.81 cm/s LV PW:         1.01 cm  LV E/e' medial:  34.6 LV IVS:        1.04 cm  LV e' lateral:   8.16 cm/s LVOT diam:     1.70 cm  LV E/e' lateral: 16.2 LV SV:         43 LV SV Index:   27 LVOT Area:     2.27 cm  RIGHT VENTRICLE RV Basal diam:  3.46 cm LEFT ATRIUM  Index       RIGHT ATRIUM            Index LA diam:        4.80 cm 3.09 cm/m  RA Area:     13.90 cm LA Vol (A2C):   33.0 ml 21.26 ml/m RA Volume:   35.10 ml  22.61 ml/m LA Vol (A4C):   46.4 ml 29.89 ml/m LA Biplane Vol: 40.4 ml 26.02 ml/m  AORTIC VALVE AV Area (Vmax):    1.27 cm AV Area (Vmean):   1.15 cm AV Area (VTI):     1.11 cm AV Vmax:           165.00 cm/s AV Vmean:          120.000 cm/s AV VTI:            0.383 m AV Peak Grad:      10.9 mmHg AV Mean Grad:      6.0 mmHg LVOT Vmax:         92.40 cm/s LVOT Vmean:        61.000 cm/s LVOT VTI:          0.188 m LVOT/AV VTI ratio: 0.49  AORTA Ao Root diam: 2.70 cm MITRAL VALVE MV Area (PHT): 7.44 cm     SHUNTS MV Decel Time: 102 msec     Systemic VTI:  0.19 m MV E velocity: 132.00 cm/s  Systemic Diam: 1.70 cm MV A velocity: 138.00 cm/s MV E/A ratio:  0.96 Donato Schultz MD Electronically signed by Donato Schultz MD Signature Date/Time: 10/20/2020/2:38:33 PM    Final         Scheduled Meds: . chlorhexidine  15 mL Mouth Rinse BID  . Chlorhexidine Gluconate Cloth  6 each Topical Daily  . docusate sodium  100 mg Oral BID  . feeding supplement  237 mL Oral BID BM  . fluticasone  1 spray Each Nare Daily  . mouth rinse  15 mL Mouth Rinse q12n4p  . multivitamin with minerals  1 tablet Oral Daily  . polyethylene glycol  17 g Oral BID  . sertraline  75 mg Oral Daily   Continuous Infusions: . sodium chloride 75 mL/hr at 10/21/20 1708  . lactated ringers 10 mL/hr at 10/20/20 0946  . pantoprozole (PROTONIX) infusion 8 mg/hr (10/21/20 1100)     LOS: 4 days    Time spent: 40 minutes    Ramiro Harvest, MD [facility]   To contact the attending provider between 7A-7P or the covering provider during after hours 7P-7A, please log into the web site [email or web address] and access using universal [place] password for that web site. If you do not have the password, please call the hospital operator.  10/21/2020, 5:50 PM

## 2020-10-21 NOTE — Progress Notes (Addendum)
Patient had a large bowel movement last night (on MiraLAX) but the character of the stool is not known, that is, not clear if it was bloody, and if so, whether it was old blood or fresh blood.  Patient's hemoglobin was holding steady at 9.8 as of last night following yesterday's transfusion.  Labs from today are pending.  CT from yesterday was unrevealing for any obvious source of bleeding, such as a small bowel tumor, colitis, or significant colonic diverticulosis.  There was no evidence of fecal retention to suggest stercoral ulceration.  Patient complains of right-sided pain.  No abdominal tenderness on exam.  Patient in no distress, alert, vital signs stable.  Impression: Recurrent hematochezia of unclear cause.  Unclear if there is ongoing bleeding.  Plan: Since we do not have a source for the patient's bleeding on bleeding scan, endoscopy, or CT scan, and since she has had significant blood loss, I feel that further diagnostic evaluation is warranted.  1.  Capsule endoscopy later today.  2.  Tentative unprepped colonoscopy tomorrow to try to discern whether there is active bleeding, and if so, from what part of the colon.  I reviewed the purpose and risks of the test with the patient and she is agreeable.  This test may not be necessary if it appears that the bleeding abates.  Time for today's encounter, including coordination of care, was approximately 1 hour.  Florencia Reasons, M.D. Pager [phone] If no answer or after 5 PM call [phone]

## 2020-10-21 NOTE — Progress Notes (Signed)
Physical Therapy Treatment Patient Details Name: Karen Mendez MRN: [id number] DOB: Sep 29, 1922 Today's Date: 10/21/2020    History of Present Illness Pt s/p fall from bed at [facility] and with R femoral neck fx and nondisplaced medial R and inferior pubic ramus fx.  Pt now s/p percutaneous screw fixation of femoral neck.  Pt with hx of Fibromyalgia macular degeneration, DDD and multiple falls.    PT Comments    Pt continues to require +2 assist for mobility. Session limited 2* incontinence and pt to go for procedure later today. Mobility remains limited by pain and weakness. Will continue to follow and progress activity as tolerated. Continue to recommend SNF.     Follow Up Recommendations  SNF     Equipment Recommendations  None recommended by PT    Recommendations for Other Services       Precautions / Restrictions Precautions Precautions: Fall Restrictions Weight Bearing Restrictions: No Other Position/Activity Restrictions: WBAT-xfers only    Mobility  Bed Mobility Overal bed mobility: Needs Assistance Bed Mobility: Rolling Rolling: +2 for safety/equipment;Max assist         General bed mobility comments: Rolling to L and R sides for practice, hygiene, and linen changing. Assist to positon LEs and complete roll. Mobility limited by pain. Cues for safety, technique.    Transfers                 General transfer comment: NT on today 2* incontinence and pt to go for procedure later today.  Ambulation/Gait                 Stairs             Wheelchair Mobility    Modified Rankin (Stroke Patients Only)       Balance                                            Cognition Arousal/Alertness: Awake/alert Behavior During Therapy: WFL for tasks assessed/performed Overall Cognitive Status: Within Functional Limits for tasks assessed                                        Exercises      General  Comments        Pertinent Vitals/Pain Faces Pain Scale: Hurts even more Pain Location: R hip with activity Pain Descriptors / Indicators: Discomfort;Grimacing;Sore;Guarding;Aching Pain Intervention(s): Limited activity within patient's tolerance;Monitored during session;Repositioned    Home Living                      Prior Function            PT Goals (current goals can now be found in the care plan section) Progress towards PT goals: Progressing toward goals    Frequency           PT Plan Current plan remains appropriate    Co-evaluation              AM-PAC PT "6 Clicks" Mobility   Outcome Measure  Help needed turning from your back to your side while in a flat bed without using bedrails?: A Lot Help needed moving from lying on your back to sitting on the side of a flat bed without using bedrails?: A Lot  Help needed moving to and from a bed to a chair (including a wheelchair)?: Total Help needed standing up from a chair using your arms (e.g., wheelchair or bedside chair)?: Total Help needed to walk in hospital room?: Total Help needed climbing 3-5 steps with a railing? : Total 6 Click Score: 8    End of Session   Activity Tolerance: Patient limited by pain Patient left: in bed;with call bell/phone within reach;with bed alarm set   PT Visit Diagnosis: Difficulty in walking, not elsewhere classified (R26.2);Pain;Muscle weakness (generalized) (M62.81);History of falling (Z91.81) Pain - Right/Left: Right Pain - part of body: Hip     Time: 2595-6387 PT Time Calculation (min) (ACUTE ONLY): 24 min  Charges:  $Therapeutic Activity: 23-37 mins                        Faye Ramsay, PT Acute Rehabilitation  Office: [phone] Pager: [phone]

## 2020-10-21 NOTE — Progress Notes (Signed)
Pt had 2 bm's throughout shift.  One small, one large.  Both red brown in color.  Type 6/7 consistency.

## 2020-10-22 ENCOUNTER — Encounter (HOSPITAL_COMMUNITY): Payer: Self-pay | Admitting: Certified Registered Nurse Anesthetist

## 2020-10-22 ENCOUNTER — Encounter (HOSPITAL_COMMUNITY)
Admission: EM | Disposition: A | Discharge status: Skilled Nursing Facility | Payer: Self-pay | Source: Skilled Nursing Facility | Attending: Internal Medicine

## 2020-10-22 DIAGNOSIS — E782 Mixed hyperlipidemia: Secondary | ICD-10-CM | POA: Diagnosis not present

## 2020-10-22 DIAGNOSIS — I10 Essential (primary) hypertension: Secondary | ICD-10-CM | POA: Diagnosis not present

## 2020-10-22 DIAGNOSIS — S72001D Fracture of unspecified part of neck of right femur, subsequent encounter for closed fracture with routine healing: Secondary | ICD-10-CM | POA: Diagnosis not present

## 2020-10-22 DIAGNOSIS — K922 Gastrointestinal hemorrhage, unspecified: Secondary | ICD-10-CM | POA: Diagnosis not present

## 2020-10-22 LAB — CBC WITH DIFFERENTIAL/PLATELET
Abs Immature Granulocytes: 0.09 10*3/uL — ABNORMAL HIGH (ref 0.00–0.07)
Basophils Absolute: 0 10*3/uL (ref 0.0–0.1)
Basophils Relative: 0 %
Eosinophils Absolute: 0.5 10*3/uL (ref 0.0–0.5)
Eosinophils Relative: 5 %
HCT: 25.1 % — ABNORMAL LOW (ref 36.0–46.0)
Hemoglobin: 8.1 g/dL — ABNORMAL LOW (ref 12.0–15.0)
Immature Granulocytes: 1 %
Lymphocytes Relative: 27 %
Lymphs Abs: 2.5 10*3/uL (ref 0.7–4.0)
MCH: 30.5 pg (ref 26.0–34.0)
MCHC: 32.3 g/dL (ref 30.0–36.0)
MCV: 94.4 fL (ref 80.0–100.0)
Monocytes Absolute: 0.7 10*3/uL (ref 0.1–1.0)
Monocytes Relative: 7 %
Neutro Abs: 5.6 10*3/uL (ref 1.7–7.7)
Neutrophils Relative %: 60 %
Platelets: 169 10*3/uL (ref 150–400)
RBC: 2.66 MIL/uL — ABNORMAL LOW (ref 3.87–5.11)
RDW: 17.9 % — ABNORMAL HIGH (ref 11.5–15.5)
WBC: 9.5 10*3/uL (ref 4.0–10.5)
nRBC: 0.3 % — ABNORMAL HIGH (ref 0.0–0.2)

## 2020-10-22 LAB — BASIC METABOLIC PANEL
Anion gap: 5 (ref 5–15)
BUN: 20 mg/dL (ref 8–23)
CO2: 24 mmol/L (ref 22–32)
Calcium: 7.4 mg/dL — ABNORMAL LOW (ref 8.9–10.3)
Chloride: 110 mmol/L (ref 98–111)
Creatinine, Ser: 0.68 mg/dL (ref 0.44–1.00)
GFR, Estimated: >60 mL/min (ref >60)
Glucose, Bld: 93 mg/dL (ref 70–99)
Potassium: 3.5 mmol/L (ref 3.5–5.1)
Sodium: 139 mmol/L (ref 135–145)

## 2020-10-22 LAB — SURGICAL PATHOLOGY

## 2020-10-22 LAB — MAGNESIUM: Magnesium: 2.2 mg/dL (ref 1.7–2.4)

## 2020-10-22 SURGERY — CANCELLED PROCEDURE

## 2020-10-22 MED ORDER — POTASSIUM CHLORIDE CRYS ER 20 MEQ PO TBCR
40.0000 meq | EXTENDED_RELEASE_TABLET | Freq: Once | ORAL | Status: AC
  Administered 2020-10-22: 40 meq via ORAL
  Filled 2020-10-22: qty 2

## 2020-10-22 MED ORDER — PANTOPRAZOLE SODIUM 40 MG IV SOLR
40.0000 mg | INTRAVENOUS | Status: DC
  Administered 2020-10-23 – 2020-10-24 (×2): 40 mg via INTRAVENOUS
  Filled 2020-10-22 (×2): qty 40

## 2020-10-22 MED ORDER — FUROSEMIDE 10 MG/ML IJ SOLN
20.0000 mg | Freq: Once | INTRAMUSCULAR | Status: AC
  Administered 2020-10-22: 20 mg via INTRAVENOUS
  Filled 2020-10-22: qty 2

## 2020-10-22 MED ORDER — SODIUM CHLORIDE 0.9 % IV SOLN: INTRAVENOUS | Status: DC

## 2020-10-22 MED ORDER — BOOST / RESOURCE BREEZE PO LIQD CUSTOM
1.0000 | ORAL | Status: DC
  Administered 2020-10-23 – 2020-10-27 (×5): 1 via ORAL

## 2020-10-22 MED ORDER — PANTOPRAZOLE SODIUM 40 MG PO TBEC: 40.0000 mg | DELAYED_RELEASE_TABLET | Freq: Every day | ORAL | Status: DC

## 2020-10-22 SURGICAL SUPPLY — 21 items

## 2020-10-22 NOTE — Progress Notes (Signed)
Nutrition Follow-up  DOCUMENTATION CODES:   Not applicable  INTERVENTION:  - will d/c Ensure per patient request. - will trial Boost Breeze once/day, each supplement provides 250 kcal and 9 grams of protein.   NUTRITION DIAGNOSIS:   Increased nutrient needs related to post-op healing as evidenced by estimated needs. -ongoing  GOAL:   Patient will meet greater than or equal to 90% of their needs -unmet/previously unable to meet   MONITOR:   PO intake,Supplement acceptance,Labs,Weight trends  ASSESSMENT:   87 year old female with medical history of HTN, HOLD, fibromyalgia, recurrent falls, and DDD. She presented to the ED from [facility] after accidentally falling out of bed and experiencing immediate R hip pain. She did not lose consciousness. Patient reported that she has been non-ambulatory for 1 year after she had a fall with several rib fractures and only uses a wheelchair to get around.  aidet advanced from NPO to CLD on 5/23 at 1327, changed back to NPO that day at 1751, re-advanced to CLD on 5/25 at 1700, changed back to NPO today at 1000, and then advanced to Regular today at 1017.  Ensure Enlive ordered BID and patient has received several bottles, but reports to RD that she does not like this supplement and would be appreciative of order being discontinued.   Lunch tray was delivered shortly before visit. Helped patient get set up for meal. She is able to feed herself and began eating immediately. Patient reports feeling very hungry and is very happy to have solid food, not only liquids.   Weight has been stable for the past 2 days, -6 lb compared to weight on 5/22. Mild pitting edema to BLE and moderate pitting edema to sacral area documented in the edema section of flow sheet.  Per notes: - s/p closed reduction and perc screw fixation of R femoral neck fx on 5/21 - acute GIB/ABLA - s/p capsule endoscopy pending results   Labs reviewed; Ca: 7.4 mg/dl. Medications  reviewed; 100 mg colace BID, 20 mg IV lasix x1 dose 5/26, 1 tablet multivitamin with minerals/day, 17 g miralax BID, 40 mEq Klor-Con x1 dose 5/26.    NUTRITION - FOCUSED PHYSICAL EXAM:  Flowsheet Row Most Recent Value  Orbital Region No depletion  Upper Arm Region Mild depletion  Thoracic and Lumbar Region Unable to assess  Buccal Region Mild depletion  Temple Region Moderate depletion  Clavicle Bone Region Mild depletion  Clavicle and Acromion Bone Region Mild depletion  Scapular Bone Region Unable to assess  Dorsal Hand Mild depletion  Patellar Region Unable to assess  Anterior Thigh Region Unable to assess  Posterior Calf Region Unable to assess  Edema (RD Assessment) Unable to assess  Hair Reviewed  Eyes Reviewed  Mouth Reviewed  Skin Reviewed  Nails Reviewed       Diet Order:   Diet Order            Diet regular Room service appropriate? Yes; Fluid consistency: Thin  Diet effective now                 EDUCATION NEEDS:   No education needs have been identified at this time  Skin:  Skin Assessment: Skin Integrity Issues: Skin Integrity Issues:: Incisions Incisions: R hip (5/21)  Last BM:  5/26 (type 7 x2)  Height:   Ht Readings from Last 1 Encounters:  10/20/20 5\' 1"  (1.549 m)    Weight:   Wt Readings from Last 1 Encounters:  10/22/20 57.1 kg  Ideal Body Weight:  50 kg  BMI:  Body mass index is 23.79 kg/m.  Estimated Nutritional Needs:   Kcal:  1500-1700  Protein:  65-80 grams  Fluid:  > 1.5 L     Trenton Gammon, MS, RD, LDN, CNSC Inpatient Clinical Dietitian RD pager # available in [facility]  After hours/weekend pager # available in [facility]

## 2020-10-22 NOTE — TOC Transition Note (Signed)
Transition of Care [facility]) - CM/SW Discharge Note   Patient Details  Name: [name] MRN: [id number] Date of Birth: 09/01/1922  Transition [facility]) CM/SW Contact:  Golda Acre, RN Phone Number: 10/22/2020, 2:26 PM   Clinical Narrative:       Final next level of care: Skilled Nursing Facility Barriers to Discharge: Continued Medical Work up,SNF Pending bed offer   Patient Goals and CMS Choice Patient states their goals for this hospitalization and ongoing recovery are:: Go to rehab and then return to [facility] CMS Medicare.gov Compare Post Acute Care list provided to:: Patient Choice offered to / list presented to : Patient,Adult Children  Discharge Placement                       Discharge Plan and Services In-house Referral: Clinical Social Work   Post Acute Care Choice: Skilled Nursing Facility          DME Arranged: N/A DME Agency: NA                  Social Determinants of Health (SDOH) Interventions     Readmission Risk Interventions No flowsheet data found.

## 2020-10-22 NOTE — Progress Notes (Signed)
PROGRESS NOTE    Karen Mendez  KWI:[id number] DOB: 04-17-1923 DOA: 10/17/2020 PCP: [name], MD    Chief Complaint  Patient presents with  . Fall    Brief Narrative:   87 year old female lives at [place] status post mechanical fall and right hip fracture.  She is now status post repair.  Her past medical history includes hypertension fibromyalgia recurrent falls hyperlipidemia and degenerative joint disease.  Patient reports that she has not walked much for the past year after she had a fall with several rib fractures and uses wheelchair to get around.  10/20/2020 summary-patient was found to have large amounts of loose dark stools which was guaiac positive.  She was transfused 5 units of packed RBC, bite of that her expected.  She had a tagged RBC scan which was negative.  EGD done on 10/20/2020 which showed no signs of active bleeding.  Hb 10 from 6.7 (she received 5 units prbc)   Assessment & Plan:   Principal Problem:   Hip fracture (HCC) Active Problems:   Essential hypertension   Mixed hyperlipidemia   Fall   Gastrointestinal hemorrhage   ABLA (acute blood loss anemia)   Chronic diastolic CHF (congestive heart failure) (HCC)   Hypernatremia   AKI (acute kidney injury) (HCC)   1 nondisplaced right femoral neck fracture and displaced fractures to the medial right inferior pubic ramus status post mechanical fall -Patient seen in consultation by orthopedics. -Patient underwent closed reduction and percutaneous screw fixation of right femoral neck fracture per orthopedics.- -WBAT per orthopedics -Continue scheduled Tylenol. -Limit narcotics. -PT recommending SNF on discharge. -Outpatient follow-up with orthopedics for wound check and suture removal and x-rays.  2.  Acute GI bleed/acute blood loss anemia -Questionable etiology. -Status post transfusion 5 units packed red blood cells during this hospitalization. -Hemoglobin currently at 8.1. -Status post  IV Feraheme. -Anemia panel from 11/04/2019 with iron level of 74, TIBC of 245, folate of 8.8. -CT abdomen and pelvis which was done negative for any masses or diverticulosis -Bleeding scan negative for any source of bleeding. -Upper endoscopy negative for any source of bleeding. -Patient being followed by GI and patient with capsule endoscopy 10/21/2020, results pending.  -Stools are clearing up and less bloody. -Per GI as stools are clearing up no hold off on doing a colonoscopy and monitor for now and may reconsider colonoscopy if capsule endoscopy negative for source of bleeding and ongoing evidence of recurrent bleeding. -Diet being advanced per GI. -Was on a Protonix drip. -Placed on Protonix 40 mg IV daily. -Per GI.  3.  Hypertension -BP stable.  Repeat blood pressure manually was 158/64. -Saline lock IV fluids.   -Lasix 20 mg IV x1 due to concerns for dependent edema.   -Continue to hold Coreg and home dose oral Lasix.   4.  History of chronic diastolic CHF -Patient denies any shortness of breath.   -Per RN patient with some slight dependent edema.   -Saline lock IV fluids.   -Lasix 20 mg IV x1.   -Likely resume home regimen diuretics tomorrow.    5.  Hyperlipidemia -Not on statin prior to admission. -Outpatient follow-up.  6.  Acute kidney injury -Resolved.   7.  Hypernatremia -Resolved with hydration.  Saline lock IV fluids.     DVT prophylaxis: SCDs Code Status: DNR Family Communication: Updated patient.  No family at bedside Disposition:   Status is: Inpatient    Dispo: The patient is from: [place] facility  Anticipated d/c is to: Likely SNF              Patient currently undergoing work-up for GI bleed, status posttransfusion 5 units packed red blood cells.  Not stable for discharge.   Difficult to place patient no       Consultants:   Orthopedics: Dr.Adair 10/17/2020  Gastroenterology: Dr. Matthias Hughs 10/19/2020  Procedures:   CT  abdomen and pelvis 10/20/2020  Tagged RBC bleeding scan 10/19/2020  2D echo 10/20/2020  Plain films of the right hip and pelvis 10/17/2020  Upper endoscopy per Dr. Matthias Hughs 10/20/2020  Close reduction percutaneous screw fixation of right femoral neck fracture per Dr. Susa Simmonds, orthopedics 10/17/2020  Transfusion 5 units packed red blood cells 5/22-5/24/2022  Capsule endoscopy 10/21/2020  Antimicrobials:   IV clindamycin 10/17/2020>>>> 10/18/2020   Subjective: Patient states doesn't feel too well today. C/O weakness. Stools clearing up and becoming more brown., No CP. No SOB. Heartburn resolved with GI cocktail. Diet being advanced per GI.  Repeat manual blood pressure 158/64.  Objective: Vitals:   10/22/20 0400 10/22/20 0500 10/22/20 0700 10/22/20 0800  BP: (!) 158/34  (!) 174/28   Pulse: 79  75   Resp: 13  12   Temp: 97.8 F (36.6 C)   (!) 97.4 F (36.3 C)  TempSrc: Oral   Oral  SpO2: 92%  94%   Weight:  57.1 kg    Height:        Intake/Output Summary (Last 24 hours) at 10/22/2020 1041 Last data filed at 10/22/2020 0600 Gross per 24 hour  Intake 1925.51 ml  Output --  Net 1925.51 ml   Filed Weights   10/20/20 0820 10/21/20 0500 10/22/20 0500  Weight: 57.2 kg 57.2 kg 57.1 kg    Examination:  General exam: NAD Respiratory system: CTA B.  Some decreased breath sounds in the bases.  No wheezing.  No crackles.  Fair air movement.  Speaking in full sentences.  Cardiovascular system: Regular rate rhythm no murmurs rubs or gallops.  No JVD.  Trace lower extremity edema.   Gastrointestinal system: Abdomen is soft, nontender, nondistended, positive bowel sounds.  No rebound.  No guarding. Central nervous system: Alert and oriented.  Moving extremities spontaneously.  No focal neurological deficits.  Extremities: Symmetric 5 x 5 power. Skin: No rashes, lesions or ulcers Psychiatry: Judgement and insight appear normal. Mood & affect appropriate.     Data Reviewed: I have  personally reviewed following labs and imaging studies  CBC: Recent Labs  Lab 10/17/20 0955 10/18/20 0738 10/19/20 0740 10/20/20 1126 10/20/20 1431 10/20/20 2036 10/21/20 1013 10/22/20 0248  WBC 10.7* 8.8   < > 11.2* 12.0* 11.8* 10.4 9.5  NEUTROABS 8.9* 7.0  --   --   --   --  7.2 5.6  HGB 10.5* 7.4*   < > 10.0* 10.2* 9.8* 9.1* 8.1*  HCT 32.2* 24.2*   < > 30.1* 30.2* 29.2* 27.4* 25.1*  MCV 103.2* 109.5*   < > 90.1 91.0 91.5 92.3 94.4  PLT 190 174   < > 147* 141* 166 171 169   < > = values in this interval not displayed.    Basic Metabolic Panel: Recent Labs  Lab 10/18/20 0322 10/19/20 0740 10/20/20 1126 10/21/20 1013 10/22/20 0248  NA 142 142 147* 143 139  K 4.7 4.4 4.2 3.6 3.5  CL 110 115* 117* 113* 110  CO2 26 25 25 25 24   GLUCOSE 122* 122* 131* 97 93  BUN 53* 51*  42* 27* 20  CREATININE 1.26* 1.10* 0.96 0.89 0.68  CALCIUM 7.8* 7.4* 7.4* 7.5* 7.4*  MG  --   --   --  2.0 2.2    GFR: Estimated Creatinine Clearance: 31 mL/min (by C-G formula based on SCr of 0.68 mg/dL).  Liver Function Tests: No results for input(s): AST, ALT, ALKPHOS, BILITOT, PROT, ALBUMIN in the last 168 hours.  CBG: No results for input(s): GLUCAP in the last 168 hours.   Recent Results (from the past 240 hour(s))  Resp Panel by RT-PCR (Flu A&B, Covid) Nasopharyngeal Swab     Status: None   Collection Time: 10/17/20  9:55 AM   Specimen: Nasopharyngeal Swab; Nasopharyngeal(NP) swabs in vial transport medium  Result Value Ref Range Status   SARS Coronavirus 2 by RT PCR NEGATIVE NEGATIVE Final    Comment: (NOTE) SARS-CoV-2 target nucleic acids are NOT DETECTED.  The SARS-CoV-2 RNA is generally detectable in upper respiratory specimens during the acute phase of infection. The lowest concentration of SARS-CoV-2 viral copies this assay can detect is 138 copies/mL. A negative result does not preclude SARS-Cov-2 infection and should not be used as the sole basis for treatment or other  patient management decisions. A negative result may occur with  improper specimen collection/handling, submission of specimen other than nasopharyngeal swab, presence of viral mutation(s) within the areas targeted by this assay, and inadequate number of viral copies(<138 copies/mL). A negative result must be combined with clinical observations, patient history, and epidemiological information. The expected result is Negative.  Fact Sheet for Patients:  BloggerCourse.com  Fact Sheet for Healthcare Providers:  SeriousBroker.it  This test is no t yet approved or cleared by the [place] FDA and  has been authorized for detection and/or diagnosis of SARS-CoV-2 by FDA under an Emergency Use Authorization (EUA). This EUA will remain  in effect (meaning this test can be used) for the duration of the COVID-19 declaration under Section 564(b)(1) of the Act, 21 U.S.C.section 360bbb-3(b)(1), unless the authorization is terminated  or revoked sooner.       Influenza A by PCR NEGATIVE NEGATIVE Final   Influenza B by PCR NEGATIVE NEGATIVE Final    Comment: (NOTE) The Xpert Xpress SARS-CoV-2/FLU/RSV plus assay is intended as an aid in the diagnosis of influenza from Nasopharyngeal swab specimens and should not be used as a sole basis for treatment. Nasal washings and aspirates are unacceptable for Xpert Xpress SARS-CoV-2/FLU/RSV testing.  Fact Sheet for Patients: BloggerCourse.com  Fact Sheet for Healthcare Providers: SeriousBroker.it  This test is not yet approved or cleared by the [place] FDA and has been authorized for detection and/or diagnosis of SARS-CoV-2 by FDA under an Emergency Use Authorization (EUA). This EUA will remain in effect (meaning this test can be used) for the duration of the COVID-19 declaration under Section 564(b)(1) of the Act, 21 U.S.C. section  360bbb-3(b)(1), unless the authorization is terminated or revoked.  Performed at [facility], [address] [place], [place] [address]   MRSA PCR Screening     Status: None   Collection Time: 10/19/20 11:11 AM   Specimen: Nasopharyngeal  Result Value Ref Range Status   MRSA by PCR NEGATIVE NEGATIVE Final    Comment:        The GeneXpert MRSA Assay (FDA approved for NASAL specimens only), is one component of a comprehensive MRSA colonization surveillance program. It is not intended to diagnose MRSA infection nor to guide or monitor treatment for MRSA infections. Performed at  [facility], [address], [place], [place] [address]          Radiology Studies: CT ABDOMEN PELVIS WO CONTRAST  Result Date: 10/20/2020 CLINICAL DATA:  87 year old with abdominal pain.  GI bleeding. EXAM: CT ABDOMEN AND PELVIS WITHOUT CONTRAST TECHNIQUE: Multidetector CT imaging of the abdomen and pelvis was performed following the standard protocol without IV contrast. COMPARISON:  Nuclear medicine tagged red blood cell study 10/19/2020 FINDINGS: Lower chest: Trace pleural fluid, right side greater than left. Mild bronchiectasis in the lower lobes. Patchy densities at lung bases likely represent atelectasis or mild scarring. Hepatobiliary: No gross abnormality to the liver. Normal appearance of the gallbladder with mild distention. Pancreas: Unremarkable. No pancreatic ductal dilatation or surrounding inflammatory changes. Spleen: Normal in size without focal abnormality. Adrenals/Urinary Tract: Normal appearance of the adrenal glands. Mild distention of the urinary bladder with fluid. Normal appearance of the kidneys without hydronephrosis. No evidence for kidney stones. Stomach/Bowel: Oral contrast in the distal small bowel and colon. No evidence for obstruction. Normal appearance of the stomach. No focal bowel inflammation. Vascular/Lymphatic: Diffuse vascular  calcifications throughout the abdomen and pelvis. Negative for an aortic aneurysm. Large number of mesenteric lymph nodes throughout the central abdomen. These prominent mesenteric lymph nodes are best seen on the coronal images, sequence 5 image 24 and on the axial images, sequence 2 image 42. No significant lymph node enlargement around the aorta or iliac vessels. No significant inguinal lymph node enlargement. Reproductive: Uterus appears to be surgically absent. No evidence for an adnexal mass. Other: Diffuse subcutaneous edema. Evidence for presacral edema. Negative for free air. Trace fluid in the right paracolic gutter. Musculoskeletal: 3 surgical screws extending through the proximal right femur. Fractures involving the right superior and inferior pubic rami. Mild anterolisthesis at L4-L5. Extensive facet arthropathy lumbar spine. Minimal retrolisthesis at L3-L4. Multilevel disc space narrowing and endplate disease in the lumbar spine. IMPRESSION: 1. No acute abnormality to explain abdominal pain. 2. Trace pleural fluid with subcutaneous edema and minimal fluid in the right paracolic gutter. 3. Prominent central mesenteric lymph nodes. Findings are nonspecific. This could represent reactive changes. Mild lymphoproliferative process cannot be completely excluded. 4. Right pubic rami fracture. Postoperative changes from right hip fracture and surgical fixation. 5.  Aortic Atherosclerosis (ICD10-I70.0). Electronically Signed   By: Richarda Overlie M.D.   On: 10/20/2020 17:15   ECHOCARDIOGRAM COMPLETE  Result Date: 10/20/2020    ECHOCARDIOGRAM REPORT   Patient Name:   Karen Mendez Date of Exam: 10/20/2020 Medical Rec #:  [id number]       Height:       61.0 in Accession #:    [id number]      Weight:       126.1 lb Date of Birth:  09-03-1922        BSA:          1.553 m Patient Age:    89 years        BP:           165/38 mmHg Patient Gender: F               HR:           83 bpm. Exam Location:  Inpatient Procedure:  2D Echo, Cardiac Doppler and Color Doppler Indications:    R94.31 Abnormal EKG  History:        Patient has prior history of Echocardiogram examinations, most  recent 11/05/2019. Risk Factors:Hypertension and Dyslipidemia.  Sonographer:    Roosvelt Maserachel Lane RDCS Referring Phys: [id number] ELIZABETH G MATHEWS IMPRESSIONS  1. Left ventricular ejection fraction, by estimation, is 55 to 60%. The left ventricle has normal function. The left ventricle has no regional wall motion abnormalities. Left ventricular diastolic parameters are consistent with Grade I diastolic dysfunction (impaired relaxation).  2. Right ventricular systolic function is normal. The right ventricular size is normal.  3. The mitral valve is normal in structure. Moderate mitral valve regurgitation. No evidence of mitral stenosis. Moderate mitral annular calcification.  4. The aortic valve is normal in structure. Aortic valve regurgitation is not visualized. No aortic stenosis is present.  5. The inferior vena cava is normal in size with greater than 50% respiratory variability, suggesting right atrial pressure of 3 mmHg. FINDINGS  Left Ventricle: Left ventricular ejection fraction, by estimation, is 55 to 60%. The left ventricle has normal function. The left ventricle has no regional wall motion abnormalities. The left ventricular internal cavity size was normal in size. There is  no left ventricular hypertrophy. Left ventricular diastolic parameters are consistent with Grade I diastolic dysfunction (impaired relaxation). Right Ventricle: The right ventricular size is normal. No increase in right ventricular wall thickness. Right ventricular systolic function is normal. Left Atrium: Left atrial size was normal in size. Right Atrium: Right atrial size was normal in size. Pericardium: There is no evidence of pericardial effusion. Mitral Valve: The mitral valve is normal in structure. Moderate mitral annular calcification. Moderate mitral valve  regurgitation. No evidence of mitral valve stenosis. Tricuspid Valve: The tricuspid valve is normal in structure. Tricuspid valve regurgitation is not demonstrated. No evidence of tricuspid stenosis. Aortic Valve: The aortic valve is normal in structure. Aortic valve regurgitation is not visualized. No aortic stenosis is present. Aortic valve mean gradient measures 6.0 mmHg. Aortic valve peak gradient measures 10.9 mmHg. Aortic valve area, by VTI measures 1.11 cm. Pulmonic Valve: The pulmonic valve was normal in structure. Pulmonic valve regurgitation is not visualized. No evidence of pulmonic stenosis. Aorta: The aortic root is normal in size and structure. Venous: The inferior vena cava is normal in size with greater than 50% respiratory variability, suggesting right atrial pressure of 3 mmHg. IAS/Shunts: No atrial level shunt detected by color flow Doppler.  LEFT VENTRICLE PLAX 2D LVIDd:         3.53 cm  Diastology LVIDs:         2.44 cm  LV e' medial:    3.81 cm/s LV PW:         1.01 cm  LV E/e' medial:  34.6 LV IVS:        1.04 cm  LV e' lateral:   8.16 cm/s LVOT diam:     1.70 cm  LV E/e' lateral: 16.2 LV SV:         43 LV SV Index:   27 LVOT Area:     2.27 cm  RIGHT VENTRICLE RV Basal diam:  3.46 cm LEFT ATRIUM             Index       RIGHT ATRIUM           Index LA diam:        4.80 cm 3.09 cm/m  RA Area:     13.90 cm LA Vol (A2C):   33.0 ml 21.26 ml/m RA Volume:   35.10 ml  22.61 ml/m LA Vol (A4C):   46.4 ml 29.89 ml/m LA  Biplane Vol: 40.4 ml 26.02 ml/m  AORTIC VALVE AV Area (Vmax):    1.27 cm AV Area (Vmean):   1.15 cm AV Area (VTI):     1.11 cm AV Vmax:           165.00 cm/s AV Vmean:          120.000 cm/s AV VTI:            0.383 m AV Peak Grad:      10.9 mmHg AV Mean Grad:      6.0 mmHg LVOT Vmax:         92.40 cm/s LVOT Vmean:        61.000 cm/s LVOT VTI:          0.188 m LVOT/AV VTI ratio: 0.49  AORTA Ao Root diam: 2.70 cm MITRAL VALVE MV Area (PHT): 7.44 cm     SHUNTS MV Decel Time: 102  msec     Systemic VTI:  0.19 m MV E velocity: 132.00 cm/s  Systemic Diam: 1.70 cm MV A velocity: 138.00 cm/s MV E/A ratio:  0.96 Donato Schultz MD Electronically signed by Donato Schultz MD Signature Date/Time: 10/20/2020/2:38:33 PM    Final         Scheduled Meds: . chlorhexidine  15 mL Mouth Rinse BID  . Chlorhexidine Gluconate Cloth  6 each Topical Daily  . docusate sodium  100 mg Oral BID  . feeding supplement  237 mL Oral BID BM  . fluticasone  1 spray Each Nare Daily  . mouth rinse  15 mL Mouth Rinse q12n4p  . multivitamin with minerals  1 tablet Oral Daily  . polyethylene glycol  17 g Oral BID  . sertraline  75 mg Oral Daily   Continuous Infusions: . sodium chloride 75 mL/hr at 10/22/20 0600  . lactated ringers 10 mL/hr at 10/20/20 0946     LOS: 5 days    Time spent: 40 minutes    Ramiro Harvest, MD [facility]   To contact the attending provider between 7A-7P or the covering provider during after hours 7P-7A, please log into the web site [email or web address] and access using universal Bertie password for that web site. If you do not have the password, please call the hospital operator.  10/22/2020, 10:41 AM

## 2020-10-22 NOTE — Progress Notes (Signed)
Patient is without complaints.    Her stool has become nonbloody.  1 g drop in hemoglobin over the past 24 hours, currently 8.1, with BUN now normal.    Capsule study completed, awaiting download and reading, hopefully later today.   PLAN:    1.  In view of nonbloody stool, I will hold off on doing colonoscopy which I had previously tentatively planned for this afternoon.  2.  My current strategy would be to reconsider doing colonoscopy if (1) the capsule study is negative for a source of bleeding and (2) there is evidence of ongoing or recurrent bleeding.  Florencia Reasons, M.D. Pager [phone] If no answer or after 5 PM call [phone]

## 2020-10-22 NOTE — Progress Notes (Signed)
Pt had 1 bm's throughout shift. One small, brown, type 6/7 consistency.

## 2020-10-22 NOTE — Progress Notes (Signed)
Pt had 2 bm's throughout shift until transfer to receiving floor.  Both small.  Both brown/black type 6/7 consistency.

## 2020-10-22 NOTE — TOC Progression Note (Addendum)
Transition of Care [facility]) - Progression Note    Patient Details  Name: LAVAEH BAU MRN: [id number] Date of Birth: [birth year]  [facility]) CM/SW Contact  Golda Acre, RN Phone Number: 10/22/2020, 2:26 PM  Clinical Narrative:    Beverely Risen - requested that she call back with which snf she wants to mother to go to. TCF_melissa the daughter wants patient to go to [facility] when ready.  Expected Discharge Plan: Skilled Nursing Facility Barriers to Discharge: Continued Medical Work up,SNF Pending bed offer  Expected Discharge Plan and Services Expected Discharge Plan: Skilled Nursing Facility In-house Referral: Clinical Social Work   Post Acute Care Choice: Skilled Nursing Facility Living arrangements for the past 2 months: Assisted Living Facility                 DME Arranged: N/A DME Agency: NA                   Social Determinants of Health (SDOH) Interventions    Readmission Risk Interventions No flowsheet data found.

## 2020-10-23 DIAGNOSIS — I10 Essential (primary) hypertension: Secondary | ICD-10-CM | POA: Diagnosis not present

## 2020-10-23 DIAGNOSIS — E782 Mixed hyperlipidemia: Secondary | ICD-10-CM | POA: Diagnosis not present

## 2020-10-23 DIAGNOSIS — S72001D Fracture of unspecified part of neck of right femur, subsequent encounter for closed fracture with routine healing: Secondary | ICD-10-CM | POA: Diagnosis not present

## 2020-10-23 DIAGNOSIS — K922 Gastrointestinal hemorrhage, unspecified: Secondary | ICD-10-CM | POA: Diagnosis not present

## 2020-10-23 LAB — CBC
HCT: 26.8 % — ABNORMAL LOW (ref 36.0–46.0)
Hemoglobin: 8.7 g/dL — ABNORMAL LOW (ref 12.0–15.0)
MCH: 30.2 pg (ref 26.0–34.0)
MCHC: 32.5 g/dL (ref 30.0–36.0)
MCV: 93.1 fL (ref 80.0–100.0)
Platelets: 208 10*3/uL (ref 150–400)
RBC: 2.88 MIL/uL — ABNORMAL LOW (ref 3.87–5.11)
RDW: 17.6 % — ABNORMAL HIGH (ref 11.5–15.5)
WBC: 8.5 10*3/uL (ref 4.0–10.5)
nRBC: 0 % (ref 0.0–0.2)

## 2020-10-23 LAB — BASIC METABOLIC PANEL
Anion gap: 3 — ABNORMAL LOW (ref 5–15)
BUN: 15 mg/dL (ref 8–23)
CO2: 30 mmol/L (ref 22–32)
Calcium: 7.9 mg/dL — ABNORMAL LOW (ref 8.9–10.3)
Chloride: 107 mmol/L (ref 98–111)
Creatinine, Ser: 0.82 mg/dL (ref 0.44–1.00)
GFR, Estimated: >60 mL/min (ref >60)
Glucose, Bld: 98 mg/dL (ref 70–99)
Potassium: 3.6 mmol/L (ref 3.5–5.1)
Sodium: 140 mmol/L (ref 135–145)

## 2020-10-23 MED ORDER — LORATADINE 10 MG PO TABS
10.0000 mg | ORAL_TABLET | Freq: Every day | ORAL | Status: DC
  Administered 2020-10-23 – 2020-10-27 (×5): 10 mg via ORAL
  Filled 2020-10-23 (×5): qty 1

## 2020-10-23 MED ORDER — FUROSEMIDE 20 MG PO TABS
20.0000 mg | ORAL_TABLET | Freq: Every day | ORAL | Status: DC
  Administered 2020-10-23 – 2020-10-27 (×5): 20 mg via ORAL
  Filled 2020-10-23 (×5): qty 1

## 2020-10-23 MED ORDER — POTASSIUM CHLORIDE CRYS ER 10 MEQ PO TBCR
10.0000 meq | EXTENDED_RELEASE_TABLET | Freq: Every day | ORAL | Status: DC
  Administered 2020-10-23: 10 meq via ORAL
  Filled 2020-10-23: qty 1

## 2020-10-23 NOTE — Progress Notes (Signed)
   10/23/20 1400  Stool Characteristics  Stool Type Type 7 (Liquid consistency with no solid pieces)  Stool Amount Medium  Stool Source Rectum  Bowel Incontinence Yes  Stool Descriptors Black;Manson Passey  Has the patient had three Type 7 stools in the last 24 hours? Yes (Noninfectious cause of loose stool is known)  External Urinary Catheter  Placement Date/Time: 10/17/20 0805   External Urinary Catheter Type: Female  Output (mL) 900 mL   Patient had one episode of BM.

## 2020-10-23 NOTE — TOC Progression Note (Signed)
Transition of Care [facility]) - Progression Note    Patient Details  Name: SHIVAUN BILELLO MRN: [id number] Date of Birth: [birth year]  Transition of [facility]) CM/SW Contact  Ida Rogue, [place] Phone Number: 10/23/2020, 4:21 PM  Clinical Narrative:   Confirmed bed choice with daughter.  Confirmed bed with [facility] for tomorrow after hearing from MD that she is possibly stable for d/c.  If not, they will not be able to take her until Tuesday.  In meantime, initiated auth request with [employer].  Found out that his insurance is not managed by [employer]  Contacted Kirsten on [employer] who stated she would initiate request this afternoon. TOC will continue to follow during the course of hospitalization.     Expected Discharge Plan: Skilled Nursing Facility Barriers to Discharge: Continued Medical Work up,SNF Pending bed offer  Expected Discharge Plan and Services Expected Discharge Plan: Skilled Nursing Facility In-house Referral: Clinical Social Work   Post Acute Care Choice: Skilled Nursing Facility Living arrangements for the past 2 months: Assisted Living Facility                 DME Arranged: N/A DME Agency: NA                   Social Determinants of Health (SDOH) Interventions    Readmission Risk Interventions No flowsheet data found.

## 2020-10-23 NOTE — Progress Notes (Addendum)
PROGRESS NOTE    Karen Mendez  LKG:[id number] DOB: February 27, 1923 DOA: 10/17/2020 PCP: [name], MD    Chief Complaint  Patient presents with  . Fall    Brief Narrative:   87 year old female lives at [facility] status post mechanical fall and right hip fracture.  She is now status post repair.  Her past medical history includes hypertension fibromyalgia recurrent falls hyperlipidemia and degenerative joint disease.  Patient reports that she has not walked much for the past year after she had a fall with several rib fractures and uses wheelchair to get around.  10/20/2020 summary-patient was found to have large amounts of loose dark stools which was guaiac positive.  She was transfused 5 units of packed RBC, bite of that her expected.  She had a tagged RBC scan which was negative.  EGD done on 10/20/2020 which showed no signs of active bleeding.  Hb 10 from 6.7 (she received 5 units prbc)   Assessment & Plan:   Principal Problem:   Hip fracture (HCC) Active Problems:   Essential hypertension   Mixed hyperlipidemia   Fall   Gastrointestinal hemorrhage   ABLA (acute blood loss anemia)   Chronic diastolic CHF (congestive heart failure) (HCC)   Hypernatremia   AKI (acute kidney injury) (HCC)   1 nondisplaced right femoral neck fracture and displaced fractures to the medial right inferior pubic ramus status post mechanical fall -Patient seen in consultation by orthopedics. -Patient underwent closed reduction and percutaneous screw fixation of right femoral neck fracture per orthopedics.- -WBAT per orthopedics -Continue scheduled Tylenol. -Limit narcotics. -PT recommending SNF on discharge. -Outpatient follow-up with orthopedics for wound check and suture removal and x-rays.  2.  Acute GI bleed/acute blood loss anemia -Questionable etiology. -Status post transfusion 5 units packed red blood cells during this hospitalization. -Hemoglobin currently at 8.7. -Status post  IV Feraheme. -Anemia panel from 11/04/2019 with iron level of 74, TIBC of 245, folate of 8.8. -CT abdomen and pelvis which was done negative for any masses or diverticulosis -Bleeding scan negative for any source of bleeding. -Upper endoscopy negative for any source of bleeding. -Capsule endoscopy negative per GI -Stools are clearing up and less bloody. -Patient tolerating current diet . -Continue PPI.   -Per GI quiescent GI bleed of undetermined origin.   -GI recommending hold off colonoscopy due to patient's advanced age, clinical stability, improving GI bleed, monitoring for another 24 hours in the hospital and if stable could be discharged home with outpatient follow-up with GI in approximately 4 weeks.   -GI recommending ongoing anticoagulation needed best to defer it for a couple more days and also recommending that aspirin not be restarted for at least another 5 days if need be otherwise recommending permanent avoidance of aspirin unless a clear indication for use due to patient's significant GI bleed.    3.  Hypertension -BP stable.   -Blood pressure currently at 139/49 this morning  -IV fluids saline lock.   -Continue Coreg resume home dose Lasix.   4.  History of chronic diastolic CHF -Patient denied any shortness of breath.   -Patient noted to have some dependent edema per RN.   -Status post Lasix 20 mg IV x1 with urine output of 1.4 L over the past 24 hours.  -Resume home regimen oral Lasix.   5.  Hyperlipidemia -Not on statin prior to admission.   -Outpatient follow-up.    6.  Acute kidney injury -Resolved.   7.  Hypernatremia -Resolved with hydration.  IV fluids saline lock.     DVT prophylaxis: SCDs Code Status: DNR Family Communication: Updated patient.  Updated daughter, Efraim Kaufmann via telephone.  Disposition:   Status is: Inpatient    Dispo: The patient is from: [facility] facility              Anticipated d/c is to: Likely SNF              Patient  currently undergoing work-up for GI bleed, status posttransfusion 5 units packed red blood cells.  Not stable for discharge.   Difficult to place patient no       Consultants:   Orthopedics: Dr.Adair 10/17/2020  Gastroenterology: Dr. Matthias Hughs 10/19/2020  Procedures:   CT abdomen and pelvis 10/20/2020  Tagged RBC bleeding scan 10/19/2020  2D echo 10/20/2020  Plain films of the right hip and pelvis 10/17/2020  Upper endoscopy per Dr. Matthias Hughs 10/20/2020  Close reduction percutaneous screw fixation of right femoral neck fracture per Dr. Susa Simmonds, orthopedics 10/17/2020  Transfusion 5 units packed red blood cells 5/22-5/24/2022  Capsule endoscopy 10/21/2020  Antimicrobials:   IV clindamycin 10/17/2020>>>> 10/18/2020   Subjective: Laying in bed.  States does not feel too bad today.  No chest pain.  No shortness of breath.  No bloody bowel movements today.  Tolerating current diet.    Objective: Vitals:   10/22/20 1523 10/22/20 1930 10/22/20 2147 10/23/20 0547  BP: (!) 139/49 (!) 156/54 (!) 163/54 (!) 165/62  Pulse: 75 100 86 85  Resp: 16 18 18 18   Temp: 98.2 F (36.8 C) 98.7 F (37.1 C) 98.6 F (37 C) 98.2 F (36.8 C)  TempSrc: Oral Oral Oral Oral  SpO2: 99% 99% 94% 99%  Weight:      Height:        Intake/Output Summary (Last 24 hours) at 10/23/2020 1202 Last data filed at 10/22/2020 2100 Gross per 24 hour  Intake 581.82 ml  Output 1400 ml  Net -818.18 ml   Filed Weights   10/20/20 0820 10/21/20 0500 10/22/20 0500  Weight: 57.2 kg 57.2 kg 57.1 kg    Examination:  General exam: : NAD Respiratory system: CTA B anterior lung fields.  No wheezes, no rhonchi, no crackles.  Speaking in full sentences.  Normal respiratory effort. Cardiovascular system: Regular rate and rhythm no murmurs rubs or gallops.  No JVD.  No lower extremity edema.  Gastrointestinal system: Abdomen soft, nontender, nondistended, positive bowel sounds.  No rebound.  No guarding. Central nervous  system: Alert and oriented. No focal neurological deficits. Extremities: Symmetric 5 x 5 power. Skin: No rashes, lesions or ulcers Psychiatry: Judgement and insight appear normal. Mood & affect appropriate.   Data Reviewed: I have personally reviewed following labs and imaging studies  CBC: Recent Labs  Lab 10/17/20 0955 10/18/20 0738 10/19/20 0740 10/20/20 1431 10/20/20 2036 10/21/20 1013 10/22/20 0248 10/23/20 0554  WBC 10.7* 8.8   < > 12.0* 11.8* 10.4 9.5 8.5  NEUTROABS 8.9* 7.0  --   --   --  7.2 5.6  --   HGB 10.5* 7.4*   < > 10.2* 9.8* 9.1* 8.1* 8.7*  HCT 32.2* 24.2*   < > 30.2* 29.2* 27.4* 25.1* 26.8*  MCV 103.2* 109.5*   < > 91.0 91.5 92.3 94.4 93.1  PLT 190 174   < > 141* 166 171 169 208   < > = values in this interval not displayed.    Basic Metabolic Panel: Recent Labs  Lab 10/19/20 0740 10/20/20 1126 10/21/20  1013 10/22/20 0248 10/23/20 0554  NA 142 147* 143 139 140  K 4.4 4.2 3.6 3.5 3.6  CL 115* 117* 113* 110 107  CO2 25 25 25 24 30   GLUCOSE 122* 131* 97 93 98  BUN 51* 42* 27* 20 15  CREATININE 1.10* 0.96 0.89 0.68 0.82  CALCIUM 7.4* 7.4* 7.5* 7.4* 7.9*  MG  --   --  2.0 2.2  --     GFR: Estimated Creatinine Clearance: 30.3 mL/min (by C-G formula based on SCr of 0.82 mg/dL).  Liver Function Tests: No results for input(s): AST, ALT, ALKPHOS, BILITOT, PROT, ALBUMIN in the last 168 hours.  CBG: No results for input(s): GLUCAP in the last 168 hours.   Recent Results (from the past 240 hour(s))  Resp Panel by RT-PCR (Flu A&B, Covid) Nasopharyngeal Swab     Status: None   Collection Time: 10/17/20  9:55 AM   Specimen: Nasopharyngeal Swab; Nasopharyngeal(NP) swabs in vial transport medium  Result Value Ref Range Status   SARS Coronavirus 2 by RT PCR NEGATIVE NEGATIVE Final    Comment: (NOTE) SARS-CoV-2 target nucleic acids are NOT DETECTED.  The SARS-CoV-2 RNA is generally detectable in upper respiratory specimens during the acute phase of  infection. The lowest concentration of SARS-CoV-2 viral copies this assay can detect is 138 copies/mL. A negative result does not preclude SARS-Cov-2 infection and should not be used as the sole basis for treatment or other patient management decisions. A negative result may occur with  improper specimen collection/handling, submission of specimen other than nasopharyngeal swab, presence of viral mutation(s) within the areas targeted by this assay, and inadequate number of viral copies(<138 copies/mL). A negative result must be combined with clinical observations, patient history, and epidemiological information. The expected result is Negative.  Fact Sheet for Patients:  10/19/20  Fact Sheet for Healthcare Providers:  BloggerCourse.com  This test is no t yet approved or cleared by the [employer] FDA and  has been authorized for detection and/or diagnosis of SARS-CoV-2 by FDA under an Emergency Use Authorization (EUA). This EUA will remain  in effect (meaning this test can be used) for the duration of the COVID-19 declaration under Section 564(b)(1) of the Act, 21 U.S.C.section 360bbb-3(b)(1), unless the authorization is terminated  or revoked sooner.       Influenza A by PCR NEGATIVE NEGATIVE Final   Influenza B by PCR NEGATIVE NEGATIVE Final    Comment: (NOTE) The Xpert Xpress SARS-CoV-2/FLU/RSV plus assay is intended as an aid in the diagnosis of influenza from Nasopharyngeal swab specimens and should not be used as a sole basis for treatment. Nasal washings and aspirates are unacceptable for Xpert Xpress SARS-CoV-2/FLU/RSV testing.  Fact Sheet for Patients: [place]  Fact Sheet for Healthcare Providers: BloggerCourse.com  This test is not yet approved or cleared by the [employer]ious[employer].[place] FDA and has been authorized for detection and/or diagnosis of SARS-CoV-2  by FDA under an Emergency Use Authorization (EUA). This EUA will remain in effect (meaning this test can be used) for the duration of the COVID-19 declaration under Section 564(b)(1) of the Act, 21 U.S.C. section 360bbb-3(b)(1), unless the authorization is terminated or revoked.  Performed at [facility], [address], [place], [place]   MRSA PCR Screening     Status: None   Collection Time: 10/19/20 11:11 AM   Specimen: Nasopharyngeal  Result Value Ref Range Status   MRSA by PCR NEGATIVE NEGATIVE Final    Comment:  The GeneXpert MRSA Assay (FDA approved for NASAL specimens only), is one component of a comprehensive MRSA colonization surveillance program. It is not intended to diagnose MRSA infection nor to guide or monitor treatment for MRSA infections. Performed at [facility], [address] [place], [place] [id number]          Radiology Studies: No results found.      Scheduled Meds: . chlorhexidine  15 mL Mouth Rinse BID  . Chlorhexidine Gluconate Cloth  6 each Topical Daily  . docusate sodium  100 mg Oral BID  . feeding supplement  1 Container Oral Q24H  . fluticasone  1 spray Each Nare Daily  . furosemide  20 mg Oral Daily  . loratadine  10 mg Oral Daily  . mouth rinse  15 mL Mouth Rinse q12n4p  . multivitamin with minerals  1 tablet Oral Daily  . pantoprazole (PROTONIX) IV  40 mg Intravenous Q24H  . polyethylene glycol  17 g Oral BID  . potassium chloride  10 mEq Oral Daily  . sertraline  75 mg Oral Daily   Continuous Infusions: . sodium chloride 20 mL/hr at 10/23/20 1009  . lactated ringers 10 mL/hr at 10/20/20 0946     LOS: 6 days    Time spent: 35 minutes    Ramiro Harvestaniel Adonias Demore, MD [facility]   To contact the attending provider between 7A-7P or the covering provider during after hours 7P-7A, please log into the web site [email or web address] and access using universal [place]  password for that web site. If you do not have the password, please call the hospital operator.  10/23/2020, 12:02 PM

## 2020-10-23 NOTE — Evaluation (Signed)
Occupational Therapy Evaluation Patient Details Name: Karen Mendez MRN: [id number] DOB: 1922-10-12 Today's Date: 10/23/2020    History of Present Illness Pt s/p fall from bed at [facility]F and with R femoral neck fx and nondisplaced medial R and inferior pubic ramus fx.  Pt now s/p percutaneous screw fixation of femoral neck.  Pt with hx of Fibromyalgia macular degeneration, DDD and multiple falls.   Clinical Impression   Patient resides at ALF, reports has been there "only a few weeks" has assist with dressing, bathing, toileting "someone wheels me to the dining room for meals." Patient also states she has dreams and will often "swing my legs around and get into my wheelchair at 4 in the morning." States she has been working on being able to pivot to the wheelchair herself, unsure of accuracy. Currently patient limited by poor endurance, strength, balance needing max A for bed mobility. Once edge of bed patient able to maintain fair sitting balance for ~1 minute before fatigues and relying on upper extremities and leaning to left. Patient asking to lay back down, hx of back pain. Recommend continued acute OT services to maximize patient balance, activity tolerance, strength to minimize caregiver burden and facilitate D/C to venue listed below.    Follow Up Recommendations  SNF    Equipment Recommendations  None recommended by OT       Precautions / Restrictions Precautions Precautions: Fall Restrictions Other Position/Activity Restrictions: WBAT-xfers only      Mobility Bed Mobility Overal bed mobility: Needs Assistance Bed Mobility: Supine to Sit;Sit to Supine     Supine to sit: Max assist;HOB elevated Sit to supine: Max assist;+2 for safety/equipment   General bed mobility comments: patient needing assistance with managing R LE towards edge of bed as well as trunk support. unable to pull on bed rail with UEs to elevate trunk    Transfers                 General  transfer comment: unable to assess today as patient with limited sitting tolerance/balance and +1 available    Balance Overall balance assessment: Needs assistance Sitting-balance support: No upper extremity supported;Bilateral upper extremity supported Sitting balance-Leahy Scale: Poor Sitting balance - Comments: initial fair sitting but decompensates after ~1 min needing UEs for support, fatigued quickly                                   ADL either performed or assessed with clinical judgement   ADL Overall ADL's : Needs assistance/impaired Eating/Feeding: Set up;Bed level   Grooming: Wash/dry face;Supervision/safety;Set up;Sitting   Upper Body Bathing: Moderate assistance;Sitting Upper Body Bathing Details (indicate cue type and reason): limited sitting tolerance/balance Lower Body Bathing: Total assistance;Sitting/lateral leans;Bed level   Upper Body Dressing : Moderate assistance;Sitting   Lower Body Dressing: Total assistance;Sitting/lateral leans;Bed level Lower Body Dressing Details (indicate cue type and reason): don socks   Toilet Transfer Details (indicate cue type and reason): unable, limited sitting balance and tolerance. able to maintain fair balance for approximately 1 minute before relying heavily onto arms and leaning towards pillows "I need to lay down" Toileting- Clothing Manipulation and Hygiene: Total assistance;Bed level         General ADL Comments: per patient she had been able to pivot to her wheelchair without assistance and had assist from ALF staff for dressing/bathing/toileting. patient needing increased assist for bed mobility, functional transfers with limited  sitting tolerance/balance and pain with mobility                  Pertinent Vitals/Pain Pain Assessment: Faces Faces Pain Scale: Hurts even more Pain Location: R hip with activity Pain Descriptors / Indicators: Discomfort;Grimacing;Sore;Guarding;Aching Pain  Intervention(s): Monitored during session     Hand Dominance Right   Extremity/Trunk Assessment Upper Extremity Assessment Upper Extremity Assessment: Generalized weakness   Lower Extremity Assessment Lower Extremity Assessment: Defer to PT evaluation   Cervical / Trunk Assessment Cervical / Trunk Assessment: Kyphotic   Communication Communication Communication: No difficulties   Cognition Arousal/Alertness: Awake/alert Behavior During Therapy: WFL for tasks assessed/performed Overall Cognitive Status: Within Functional Limits for tasks assessed                                                Home Living Family/patient expects to be discharged to:: Assisted living                             Home Equipment: Walker - 4 wheels;Cane - single point;Shower seat;Grab bars - toilet;Grab bars - tub/shower          Prior Functioning/Environment Level of Independence: Needs assistance  Gait / Transfers Assistance Needed: Pt states nonambulatory for year+ and only transfers bed to wc ADL's / Homemaking Assistance Needed: patient reports staff assisting with ADLs dressing, bathing, toileting   Comments: patient states recent transition to ALF and has only been there "a few weeks"        OT Problem List: Impaired balance (sitting and/or standing);Decreased activity tolerance;Pain      OT Treatment/Interventions: Self-care/ADL training;Balance training;Patient/family education;Therapeutic activities;Therapeutic exercise    OT Goals(Current goals can be found in the care plan section) Acute Rehab OT Goals Patient Stated Goal: Return to [facility] OT Goal Formulation: With patient Time For Goal Achievement: 11/06/20 Potential to Achieve Goals: Good  OT Frequency: Min 2X/week    AM-PAC OT "6 Clicks" Daily Activity     Outcome Measure Help from another person eating meals?: A Little Help from another person taking care of personal grooming?: A  Little Help from another person toileting, which includes using toliet, bedpan, or urinal?: Total Help from another person bathing (including washing, rinsing, drying)?: A Lot Help from another person to put on and taking off regular upper body clothing?: A Lot Help from another person to put on and taking off regular lower body clothing?: Total 6 Click Score: 12   End of Session Nurse Communication: Mobility status  Activity Tolerance: Patient limited by fatigue Patient left: in bed;with call bell/phone within reach;with bed alarm set  OT Visit Diagnosis: Pain Pain - Right/Left: Right Pain - part of body: Hip                Time: 9381-8299 OT Time Calculation (min): 18 min Charges:  OT General Charges $OT Visit: 1 Visit OT Evaluation $OT Eval Low Complexity: 1 Low  Marlyce Huge OT OT pager: [phone]  Carmelia Roller 10/23/2020, 2:02 PM

## 2020-10-23 NOTE — Care Management Important Message (Signed)
Important Message  Patient Details IM Letter given to the Patient. Name: Karen Mendez MRN: [id number] Date of Birth: 11-22-1922   Medicare Important Message Given:  Yes     Caren Macadam 10/23/2020, 10:16 AM

## 2020-10-23 NOTE — Progress Notes (Addendum)
Problem: GI bleeding of uncertain origin.    Patient's capsule study came back negative.   Meanwhile, her hemoglobin, for the first time since admission, has actually increased without transfusion, going from 8.1 yesterday morning to 8.7 this morning.  BUN continues to improve.  Most recent recorded bowel movement was yesterday afternoon and was brown-black, not bloody.  The patient indicates she has not had any bowel movements today.  Impression:  1.  Quiescent GI bleeding of undetermined origin 2.  Negative endoscopy and capsule endoscopy, thereby implying that the source of bleeding was in the colon.  CT scan did not show obvious colonic diverticulosis.  Recommendation:   1.  My plan would be the same as yesterday, namely, that in view of the patient's advanced age and current clinical stability, we should hold off on colonoscopic evaluation as we might ordinarily do in a younger person.    2.  Rather, I would favor another day of observation in the hospital, and if she remains stable, discharge tomorrow would be appropriate.  3.  I have made a follow-up appointment for the patient to see one of our physician assistants in my office approximately 4 weeks from now, to check a hemoglobin level at that time and also Hemoccult status.  It is my hope that the patient's hemoglobin will continue to improve and that the patient will become Hemoccult negative, and therefore, that we will not have to do further evaluation of the GI tract (specifically, a colonoscopy) in the future.    4.  The patient transiently received Lovenox in association with her hip fracture surgery at the beginning of this admission, after which the bleeding occurred.  I assume ongoing anticoagulation will not be needed, but if it is, it would be best to defer it for another couple of days if possible.  5.  The patient was on low-dose aspirin prior to admission.  I would recommend that aspirin not be restarted for at least  another 5 days.  Thereafter, I think there is a risk benefit analysis that needs to be made.  Since this patient has had significant GI bleeding, I would recommend permanent avoidance of aspirin unless there is a clear indication for its use.   Her appointment is with Boone Master, PA-C, for June 27 at 10:15 a.m.  I have discussed the patient's case with her daughter by telephone again today.  I will sign off.  Please call me if you have any questions.  Florencia Reasons, M.D. Pager [phone] If no answer or after 5 PM call [phone]

## 2020-10-24 DIAGNOSIS — N179 Acute kidney failure, unspecified: Secondary | ICD-10-CM | POA: Diagnosis not present

## 2020-10-24 DIAGNOSIS — D62 Acute posthemorrhagic anemia: Secondary | ICD-10-CM | POA: Diagnosis not present

## 2020-10-24 DIAGNOSIS — S72001D Fracture of unspecified part of neck of right femur, subsequent encounter for closed fracture with routine healing: Secondary | ICD-10-CM | POA: Diagnosis not present

## 2020-10-24 DIAGNOSIS — I5032 Chronic diastolic (congestive) heart failure: Secondary | ICD-10-CM | POA: Diagnosis not present

## 2020-10-24 LAB — CBC
HCT: 28 % — ABNORMAL LOW (ref 36.0–46.0)
Hemoglobin: 9.2 g/dL — ABNORMAL LOW (ref 12.0–15.0)
MCH: 31.2 pg (ref 26.0–34.0)
MCHC: 32.9 g/dL (ref 30.0–36.0)
MCV: 94.9 fL (ref 80.0–100.0)
Platelets: 225 10*3/uL (ref 150–400)
RBC: 2.95 MIL/uL — ABNORMAL LOW (ref 3.87–5.11)
RDW: 18.2 % — ABNORMAL HIGH (ref 11.5–15.5)
WBC: 8.3 10*3/uL (ref 4.0–10.5)
nRBC: 0 % (ref 0.0–0.2)

## 2020-10-24 LAB — BASIC METABOLIC PANEL
Anion gap: 6 (ref 5–15)
BUN: 13 mg/dL (ref 8–23)
CO2: 32 mmol/L (ref 22–32)
Calcium: 8 mg/dL — ABNORMAL LOW (ref 8.9–10.3)
Chloride: 105 mmol/L (ref 98–111)
Creatinine, Ser: 0.94 mg/dL (ref 0.44–1.00)
GFR, Estimated: 56 mL/min — ABNORMAL LOW (ref >60)
Glucose, Bld: 102 mg/dL — ABNORMAL HIGH (ref 70–99)
Potassium: 3.4 mmol/L — ABNORMAL LOW (ref 3.5–5.1)
Sodium: 143 mmol/L (ref 135–145)

## 2020-10-24 LAB — SARS CORONAVIRUS 2 (TAT 6-24 HRS): SARS Coronavirus 2: NEGATIVE

## 2020-10-24 MED ORDER — POTASSIUM CHLORIDE CRYS ER 10 MEQ PO TBCR
10.0000 meq | EXTENDED_RELEASE_TABLET | Freq: Every day | ORAL | Status: DC
  Administered 2020-10-25: 10 meq via ORAL
  Filled 2020-10-24: qty 1

## 2020-10-24 MED ORDER — PANTOPRAZOLE SODIUM 40 MG PO TBEC
40.0000 mg | DELAYED_RELEASE_TABLET | Freq: Every day | ORAL | Status: DC
  Administered 2020-10-25 – 2020-10-27 (×3): 40 mg via ORAL
  Filled 2020-10-24 (×3): qty 1

## 2020-10-24 MED ORDER — CARVEDILOL 3.125 MG PO TABS
3.1250 mg | ORAL_TABLET | Freq: Two times a day (BID) | ORAL | Status: DC
  Administered 2020-10-24: 3.125 mg via ORAL
  Filled 2020-10-24: qty 1

## 2020-10-24 MED ORDER — POTASSIUM CHLORIDE CRYS ER 10 MEQ PO TBCR
20.0000 meq | EXTENDED_RELEASE_TABLET | Freq: Once | ORAL | Status: AC
  Administered 2020-10-24: 20 meq via ORAL
  Filled 2020-10-24: qty 2

## 2020-10-24 NOTE — Progress Notes (Addendum)
PROGRESS NOTE    Karen Mendez  IOM:[id number] DOB: 08/23/1922 DOA: 10/17/2020 PCP: [name], MD    Chief Complaint  Patient presents with  . Fall    Brief Narrative:   87 year old female lives at [place] status post mechanical fall and right hip fracture.  She is now status post repair.  Her past medical history includes hypertension fibromyalgia recurrent falls hyperlipidemia and degenerative joint disease.  Patient reports that she has not walked much for the past year after she had a fall with several rib fractures and uses wheelchair to get around.  10/20/2020 summary-patient was found to have large amounts of loose dark stools which was guaiac positive.  She was transfused 5 units of packed RBC, bite of that her expected.  She had a tagged RBC scan which was negative.  EGD done on 10/20/2020 which showed no signs of active bleeding.  Hb 10 from 6.7 (she received 5 units prbc)   Assessment & Plan:   Principal Problem:   Hip fracture (HCC) Active Problems:   Essential hypertension   Mixed hyperlipidemia   Fall   Gastrointestinal hemorrhage   ABLA (acute blood loss anemia)   Chronic diastolic CHF (congestive heart failure) (HCC)   Hypernatremia   AKI (acute kidney injury) (HCC)   1 nondisplaced right femoral neck fracture and displaced fractures to the medial right inferior pubic ramus status post mechanical fall -Patient seen in consultation by orthopedics. -Patient underwent closed reduction and percutaneous screw fixation of right femoral neck fracture per orthopedics.- -WBAT per orthopedics -Continue scheduled Tylenol. -Limit narcotics. -PT recommending SNF on discharge. -Awaiting insurance authorization for SNF placement. -Outpatient follow-up with orthopedics for wound check and suture removal and x-rays.  2.  Acute GI bleed/acute blood loss anemia -Questionable etiology. -Status post transfusion 5 units packed red blood cells during this  hospitalization. -Hemoglobin currently at at 9.2.  Increase hemoglobin likely secondary to resumption of diuretics. -Status post IV Feraheme. -Anemia panel from 11/04/2019 with iron level of 74, TIBC of 245, folate of 8.8. -CT abdomen and pelvis which was done negative for any masses or diverticulosis -Bleeding scan negative for any source of bleeding. -Upper endoscopy negative for any source of bleeding. -Capsule endoscopy negative per GI -Stools are clearing up and less bloody. -Patient tolerating current diet . -Continue IV PPI and transition to oral PPI tomorrow.   -Per GI quiescent GI bleed of undetermined origin.   -GI recommending hold off colonoscopy due to patient's advanced age, clinical stability, improving GI bleed, monitoring for another 24 hours in the hospital and if stable could be discharged with outpatient follow-up with GI in approximately 4 weeks.   -GI recommending if ongoing anticoagulation needed best to defer it for a couple more days and also recommending that aspirin not be restarted for at least another 5 days if need be otherwise recommending permanent avoidance of aspirin unless a clear indication for use due to patient's significant GI bleed.    3.  Hypertension -BP stable.   -Continue Lasix.   -Resume home regimen Coreg  4.  History of chronic diastolic CHF -Patient with no shortness of breath.   -Patient noted to have some dependent edema per RN.   -Status post Lasix 20 mg IV x1 with urine output of 1.4 L.   -Home regimen oral Lasix resumed with UOP 2.9 L over the past 24 hours.    5.  Hyperlipidemia -Not on statin prior to admission.   -Outpatient follow-up.  6.  Acute kidney injury -Resolved.   7.  Hypernatremia -Resolved with hydration.  8.  Hypokalemia -Replete.    DVT prophylaxis: SCDs Code Status: DNR Family Communication: Updated patient.  Updated daughter Efraim KaufmannMelissa via telephone.  Disposition:   Status is: Inpatient    Dispo: The  patient is from: [facility] facility              Anticipated d/c is to: Likely SNF              Patient currently undergoing work-up for GI bleed, status posttransfusion 5 units packed red blood cells.  Stable for discharge.  Awaiting insurance authorization for placement at SNF.     Difficult to place patient no       Consultants:   Orthopedics: Dr.Adair 10/17/2020  Gastroenterology: Dr. Matthias HughsBuccini 10/19/2020  Procedures:   CT abdomen and pelvis 10/20/2020  Tagged RBC bleeding scan 10/19/2020  2D echo 10/20/2020  Plain films of the right hip and pelvis 10/17/2020  Upper endoscopy per Dr. Matthias HughsBuccini 10/20/2020  Close reduction percutaneous screw fixation of right femoral neck fracture per Dr. Susa SimmondsAdair, orthopedics 10/17/2020  Transfusion 5 units packed red blood cells 5/22-5/24/2022  Capsule endoscopy 10/21/2020  Antimicrobials:   IV clindamycin 10/17/2020>>>> 10/18/2020   Subjective: In bed.  No chest pain.  No shortness of breath.  No abdominal pain.  No further bloody bowel movements.  Tolerating diet.   Objective: Vitals:   10/23/20 1324 10/23/20 2001 10/24/20 0402 10/24/20 0500  BP: (!) 141/94 (!) 144/71 (!) 176/81   Pulse: 67 97 (!) 108   Resp: 20 18 18    Temp: 97.8 F (36.6 C) 98.4 F (36.9 C) 98.9 F (37.2 C)   TempSrc: Oral     SpO2: 97% 98% 96%   Weight:    59.1 kg  Height:        Intake/Output Summary (Last 24 hours) at 10/24/2020 1155 Last data filed at 10/24/2020 0900 Gross per 24 hour  Intake 946 ml  Output 3500 ml  Net -2554 ml   Filed Weights   10/21/20 0500 10/22/20 0500 10/24/20 0500  Weight: 57.2 kg 57.1 kg 59.1 kg    Examination: General exam: : NAD Respiratory system: CTA B anterior lung fields.  No wheezes, no rhonchi.  Speaking in full sentences.  Normal respiratory effort. Cardiovascular system: Regular rate and rhythm no murmurs rubs or gallops.  No JVD.  No lower extremity edema.  Gastrointestinal system: Abdomen soft, nontender,  nondistended, positive bowel sounds.  No rebound.  No guarding. Central nervous system: Alert and oriented. No focal neurological deficits. Extremities: Symmetric 5 x 5 power. Skin: No rashes, lesions or ulcers Psychiatry: Judgement and insight appear normal. Mood & affect appropriate.  Data Reviewed: I have personally reviewed following labs and imaging studies  CBC: Recent Labs  Lab 10/18/20 0738 10/19/20 0740 10/20/20 2036 10/21/20 1013 10/22/20 0248 10/23/20 0554 10/24/20 0502  WBC 8.8   < > 11.8* 10.4 9.5 8.5 8.3  NEUTROABS 7.0  --   --  7.2 5.6  --   --   HGB 7.4*   < > 9.8* 9.1* 8.1* 8.7* 9.2*  HCT 24.2*   < > 29.2* 27.4* 25.1* 26.8* 28.0*  MCV 109.5*   < > 91.5 92.3 94.4 93.1 94.9  PLT 174   < > 166 171 169 208 225   < > = values in this interval not displayed.    Basic Metabolic Panel: Recent Labs  Lab 10/20/20 1126 10/21/20  1013 10/22/20 0248 10/23/20 0554 10/24/20 0502  NA 147* 143 139 140 143  K 4.2 3.6 3.5 3.6 3.4*  CL 117* 113* 110 107 105  CO2 25 25 24 30  32  GLUCOSE 131* 97 93 98 102*  BUN 42* 27* 20 15 13   CREATININE 0.96 0.89 0.68 0.82 0.94  CALCIUM 7.4* 7.5* 7.4* 7.9* 8.0*  MG  --  2.0 2.2  --   --     GFR: Estimated Creatinine Clearance: 28.9 mL/min (by C-G formula based on SCr of 0.94 mg/dL).  Liver Function Tests: No results for input(s): AST, ALT, ALKPHOS, BILITOT, PROT, ALBUMIN in the last 168 hours.  CBG: No results for input(s): GLUCAP in the last 168 hours.   Recent Results (from the past 240 hour(s))  Resp Panel by RT-PCR (Flu A&B, Covid) Nasopharyngeal Swab     Status: None   Collection Time: 10/17/20  9:55 AM   Specimen: Nasopharyngeal Swab; Nasopharyngeal(NP) swabs in vial transport medium  Result Value Ref Range Status   SARS Coronavirus 2 by RT PCR NEGATIVE NEGATIVE Final    Comment: (NOTE) SARS-CoV-2 target nucleic acids are NOT DETECTED.  The SARS-CoV-2 RNA is generally detectable in upper respiratory specimens  during the acute phase of infection. The lowest concentration of SARS-CoV-2 viral copies this assay can detect is 138 copies/mL. A negative result does not preclude SARS-Cov-2 infection and should not be used as the sole basis for treatment or other patient management decisions. A negative result may occur with  improper specimen collection/handling, submission of specimen other than nasopharyngeal swab, presence of viral mutation(s) within the areas targeted by this assay, and inadequate number of viral copies(<138 copies/mL). A negative result must be combined with clinical observations, patient history, and epidemiological information. The expected result is Negative.  Fact Sheet for Patients:   Fact Sheet for Healthcare Providers:  10/19/20  This test is no t yet approved or cleared by the [employer] FDA and  has been authorized for detection and/or diagnosis of SARS-CoV-2 by FDA under an Emergency Use Authorization (EUA). This EUA will remain  in effect (meaning this test can be used) for the duration of the COVID-19 declaration under Section 564(b)(1) of the Act, 21 U.S.C.section 360bbb-3(b)(1), unless the authorization is terminated  or revoked sooner.       Influenza A by PCR NEGATIVE NEGATIVE Final   Influenza B by PCR NEGATIVE NEGATIVE Final    Comment: (NOTE) The Xpert Xpress SARS-CoV-2/FLU/RSV plus assay is intended as an aid in the diagnosis of influenza from Nasopharyngeal swab specimens and should not be used as a sole basis for treatment. Nasal washings and aspirates are unacceptable for Xpert Xpress SARS-CoV-2/FLU/RSV testing.  Fact Sheet for Patients: SeriousBroker.it  Fact Sheet for Healthcare Providers: [place]  This test is not yet approved or cleared by the [employer] FDA and has been authorized for detection  and/or diagnosis of SARS-CoV-2 by FDA under an Emergency Use Authorization (EUA). This EUA will remain in effect (meaning this test can be used) for the duration of the COVID-19 declaration under Section 564(b)(1) of the Act, 21 U.S.C. section 360bbb-3(b)(1), unless the authorization is terminated or revoked.  Performed at [facility] [address], [place], [place]   MRSA PCR Screening     Status: None   Collection Time: 10/19/20 11:11 AM   Specimen: Nasopharyngeal  Result Value Ref Range Status   MRSA by PCR NEGATIVE NEGATIVE Final    Comment:  The GeneXpert MRSA Assay (FDA approved for NASAL specimens only), is one component of a comprehensive MRSA colonization surveillance program. It is not intended to diagnose MRSA infection nor to guide or monitor treatment for MRSA infections. Performed at [facility], [address], [place], [place] [address]   SARS CORONAVIRUS 2 (TAT 6-24 HRS) Nasopharyngeal Nasopharyngeal Swab     Status: None   Collection Time: 10/23/20 11:38 AM   Specimen: Nasopharyngeal Swab  Result Value Ref Range Status   SARS Coronavirus 2 NEGATIVE NEGATIVE Final    Comment: (NOTE) SARS-CoV-2 target nucleic acids are NOT DETECTED.  The SARS-CoV-2 RNA is generally detectable in upper and lower respiratory specimens during the acute phase of infection. Negative results do not preclude SARS-CoV-2 infection, do not rule out co-infections with other pathogens, and should not be used as the sole basis for treatment or other patient management decisions. Negative results must be combined with clinical observations, patient history, and epidemiological information. The expected result is Negative.  Fact Sheet for Patients: HairSlick.no  Fact Sheet for Healthcare Providers: quierodirigir.com  This test is not yet approved or cleared by the [place] FDA and  has been authorized for detection and/or diagnosis of SARS-CoV-2 by FDA under an Emergency Use Authorization (EUA). This EUA will remain  in effect (meaning this test can be used) for the duration of the COVID-19 declaration under Se ction 564(b)(1) of the Act, 21 U.S.C. section 360bbb-3(b)(1), unless the authorization is terminated or revoked sooner.  Performed at [facility], [address], [place], [place] [address]          Radiology Studies: No results found.      Scheduled Meds: . chlorhexidine  15 mL Mouth Rinse BID  . Chlorhexidine Gluconate Cloth  6 each Topical Daily  . docusate sodium  100 mg Oral BID  . feeding supplement  1 Container Oral Q24H  . fluticasone  1 spray Each Nare Daily  . furosemide  20 mg Oral Daily  . loratadine  10 mg Oral Daily  . mouth rinse  15 mL Mouth Rinse q12n4p  . multivitamin with minerals  1 tablet Oral Daily  . pantoprazole (PROTONIX) IV  40 mg Intravenous Q24H  . polyethylene glycol  17 g Oral BID  . [START ON 10/25/2020] potassium chloride  10 mEq Oral Daily  . sertraline  75 mg Oral Daily   Continuous Infusions: . sodium chloride 20 mL/hr at 10/23/20 1009  . lactated ringers 10 mL/hr at 10/20/20 0946     LOS: 7 days    Time spent: 35 minutes    Ramiro Harvest, MD [facility]   To contact the attending provider between 7A-7P or the covering provider during after hours 7P-7A, please log into the web site [email or web address] and access using universal [place] password for that web site. If you do not have the password, please call the hospital operator.  10/24/2020, 11:55 AM

## 2020-10-24 NOTE — Progress Notes (Addendum)
The capsule from GI capsule study came out with the stool. GI was notified. Discarded appropriately as per GI advise.

## 2020-10-24 NOTE — Progress Notes (Signed)
Physical Therapy Treatment Patient Details Name: Karen Mendez MRN: [id number] DOB: March 09, 1923 Today's Date: 10/24/2020    History of Present Illness Pt s/p fall from bed at [facility] 10/17/20 and with R femoral neck fx and nondisplaced medial R and inferior pubic ramus fx.  Pt now s/p percutaneous screw fixation of femoral neck 10/17/20.  Pt with hx of Fibromyalgia macular degeneration, DDD and multiple falls.    PT Comments    Pt with gradual progress, performed multiple transfers but continues to require mod-max x 2 for transfers.  Pt somewhat limited by pain with movement but RN reports even declines taking Tylenol.  Continue to progress as able.    Follow Up Recommendations  SNF     Equipment Recommendations  None recommended by PT    Recommendations for Other Services       Precautions / Restrictions Precautions Precautions: Fall Restrictions Weight Bearing Restrictions: No Other Position/Activity Restrictions: WBAT-xfers only    Mobility  Bed Mobility Overal bed mobility: Needs Assistance Bed Mobility: Supine to Sit;Sit to Supine     Supine to sit: Mod assist;+2 for physical assistance Sit to supine: Mod assist;+2 for physical assistance   General bed mobility comments: Increased time, assist for R LE and trunk support    Transfers Overall transfer level: Needs assistance Equipment used: 2 person hand held assist Transfers: Sit to/from Stand;Stand Pivot Transfers Sit to Stand: +2 physical assistance;Max assist Stand pivot transfers: Max assist;+2 physical assistance       General transfer comment: Stand pivot to [facility] and back to bed; Max A to rise and pivot with cues for hand placement and pushing up through L LE; Total assist toielting ADLs  Ambulation/Gait                 Stairs             Wheelchair Mobility    Modified Rankin (Stroke Patients Only)       Balance Overall balance assessment: Needs assistance Sitting-balance  support: No upper extremity supported;Bilateral upper extremity supported Sitting balance-Leahy Scale: Fair Sitting balance - Comments: initial fair sitting but decompensates after ~1 min needing UEs for support, fatigued quickly     Standing balance-Leahy Scale: Zero Standing balance comment: requiring max x 2                            Cognition Arousal/Alertness: Awake/alert Behavior During Therapy: WFL for tasks assessed/performed Overall Cognitive Status: Within Functional Limits for tasks assessed                                        Exercises      General Comments        Pertinent Vitals/Pain Pain Assessment: Faces Faces Pain Scale: Hurts little more Pain Location: R hip with activity Pain Descriptors / Indicators: Discomfort;Grimacing;Sore;Guarding;Aching Pain Intervention(s): Limited activity within patient's tolerance;Monitored during session (declines pain meds)    Home Living                      Prior Function            PT Goals (current goals can now be found in the care plan section) Acute Rehab PT Goals Patient Stated Goal: Return to [facility] PT Goal Formulation: With patient Time For Goal Achievement: 11/01/20 Potential to Achieve  Goals: Good Progress towards PT goals: Progressing toward goals    Frequency    Min 3X/week      PT Plan Frequency needs to be updated    Co-evaluation              AM-PAC PT "6 Clicks" Mobility   Outcome Measure  Help needed turning from your back to your side while in a flat bed without using bedrails?: A Lot Help needed moving from lying on your back to sitting on the side of a flat bed without using bedrails?: Total Help needed moving to and from a bed to a chair (including a wheelchair)?: Total Help needed standing up from a chair using your arms (e.g., wheelchair or bedside chair)?: Total Help needed to walk in hospital room?: Total Help needed climbing 3-5  steps with a railing? : Total 6 Click Score: 7    End of Session Equipment Utilized During Treatment: Gait belt Activity Tolerance: Patient limited by pain Patient left: in bed;with call bell/phone within reach;with bed alarm set Nurse Communication: Mobility status; endoscopic capsule out during BM -RN into retrieve PT Visit Diagnosis: Difficulty in walking, not elsewhere classified (R26.2);Pain;Muscle weakness (generalized) (M62.81);History of falling (Z91.81) Pain - Right/Left: Right Pain - part of body: Hip     Time: 2334-3568 PT Time Calculation (min) (ACUTE ONLY): 34 min  Charges:  $Therapeutic Activity: 23-37 mins                     Anise Salvo, PT Acute Rehab Services Pager [phone] [facility] [phone]     Rayetta Humphrey 10/24/2020, 11:22 AM

## 2020-10-25 DIAGNOSIS — I5032 Chronic diastolic (congestive) heart failure: Secondary | ICD-10-CM | POA: Diagnosis not present

## 2020-10-25 DIAGNOSIS — D62 Acute posthemorrhagic anemia: Secondary | ICD-10-CM | POA: Diagnosis not present

## 2020-10-25 DIAGNOSIS — S72001D Fracture of unspecified part of neck of right femur, subsequent encounter for closed fracture with routine healing: Secondary | ICD-10-CM | POA: Diagnosis not present

## 2020-10-25 DIAGNOSIS — N179 Acute kidney failure, unspecified: Secondary | ICD-10-CM | POA: Diagnosis not present

## 2020-10-25 LAB — BASIC METABOLIC PANEL
Anion gap: 7 (ref 5–15)
BUN: 12 mg/dL (ref 8–23)
CO2: 32 mmol/L (ref 22–32)
Calcium: 7.8 mg/dL — ABNORMAL LOW (ref 8.9–10.3)
Chloride: 103 mmol/L (ref 98–111)
Creatinine, Ser: 0.88 mg/dL (ref 0.44–1.00)
GFR, Estimated: >60 mL/min (ref >60)
Glucose, Bld: 92 mg/dL (ref 70–99)
Potassium: 3.5 mmol/L (ref 3.5–5.1)
Sodium: 142 mmol/L (ref 135–145)

## 2020-10-25 LAB — CBC
HCT: 28.8 % — ABNORMAL LOW (ref 36.0–46.0)
Hemoglobin: 9.3 g/dL — ABNORMAL LOW (ref 12.0–15.0)
MCH: 31 pg (ref 26.0–34.0)
MCHC: 32.3 g/dL (ref 30.0–36.0)
MCV: 96 fL (ref 80.0–100.0)
Platelets: 234 10*3/uL (ref 150–400)
RBC: 3 MIL/uL — ABNORMAL LOW (ref 3.87–5.11)
RDW: 18.2 % — ABNORMAL HIGH (ref 11.5–15.5)
WBC: 7.3 10*3/uL (ref 4.0–10.5)
nRBC: 0 % (ref 0.0–0.2)

## 2020-10-25 MED ORDER — IPRATROPIUM-ALBUTEROL 0.5-2.5 (3) MG/3ML IN SOLN: 3.0000 mL | RESPIRATORY_TRACT | Status: DC | PRN

## 2020-10-25 MED ORDER — CARVEDILOL 3.125 MG PO TABS
3.1250 mg | ORAL_TABLET | Freq: Two times a day (BID) | ORAL | Status: DC
  Administered 2020-10-25 – 2020-10-27 (×5): 3.125 mg via ORAL
  Filled 2020-10-25 (×5): qty 1

## 2020-10-25 MED ORDER — IPRATROPIUM-ALBUTEROL 0.5-2.5 (3) MG/3ML IN SOLN: 3.0000 mL | Freq: Three times a day (TID) | RESPIRATORY_TRACT | Status: DC

## 2020-10-25 MED ORDER — FLUTICASONE PROPIONATE 50 MCG/ACT NA SUSP: 2.0000 | Freq: Every day | NASAL | Status: DC

## 2020-10-25 MED ORDER — POTASSIUM CHLORIDE CRYS ER 10 MEQ PO TBCR
40.0000 meq | EXTENDED_RELEASE_TABLET | Freq: Once | ORAL | Status: AC
  Administered 2020-10-25: 40 meq via ORAL
  Filled 2020-10-25: qty 4

## 2020-10-25 NOTE — Progress Notes (Signed)
PROGRESS NOTE    Karen Mendez  DVV:[id number] DOB: 12-12-1922 DOA: 10/17/2020 PCP: [name], MD    Chief Complaint  Patient presents with  . Fall    Brief Narrative:   87 year old female lives at [place] status post mechanical fall and right hip fracture.  She is now status post repair.  Her past medical history includes hypertension fibromyalgia recurrent falls hyperlipidemia and degenerative joint disease.  Patient reports that she has not walked much for the past year after she had a fall with several rib fractures and uses wheelchair to get around.  10/20/2020 summary-patient was found to have large amounts of loose dark stools which was guaiac positive.  She was transfused 5 units of packed RBC, bite of that her expected.  She had a tagged RBC scan which was negative.  EGD done on 10/20/2020 which showed no signs of active bleeding.  Hb 10 from 6.7 (she received 5 units prbc)   Assessment & Plan:   Principal Problem:   Hip fracture (HCC) Active Problems:   Essential hypertension   Mixed hyperlipidemia   Fall   Gastrointestinal hemorrhage   ABLA (acute blood loss anemia)   Chronic diastolic CHF (congestive heart failure) (HCC)   Hypernatremia   AKI (acute kidney injury) (HCC)   1 nondisplaced right femoral neck fracture and displaced fractures to the medial right inferior pubic ramus status post mechanical fall -Patient seen in consultation by orthopedics. -Patient underwent closed reduction and percutaneous screw fixation of right femoral neck fracture per orthopedics.- -WBAT per orthopedics -Continue scheduled Tylenol. -Limit narcotics. -PT recommending SNF on discharge. -Awaiting insurance authorization for SNF placement. -Outpatient follow-up with orthopedics for wound check and suture removal and x-rays.  2.  Acute GI bleed/acute blood loss anemia -Questionable etiology. -Status post transfusion 5 units packed red blood cells during this  hospitalization. -Hemoglobin currently at at 9.3.  Increase hemoglobin likely secondary to resumption of diuretics. -Status post IV Feraheme. -Anemia panel from 11/04/2019 with iron level of 74, TIBC of 245, folate of 8.8. -CT abdomen and pelvis which was done negative for any masses or diverticulosis -Bleeding scan negative for any source of bleeding. -Upper endoscopy negative for any source of bleeding. -Capsule endoscopy negative per GI -Stools are clearing up and less bloody. -Patient tolerating current diet . -IV PPI has been transitioned to oral PPI.  -Per GI quiescent GI bleed of undetermined origin.   -GI recommending hold off colonoscopy due to patient's advanced age, clinical stability, improving GI bleed. -Hemoglobin currently stable, patient with no GI bleed over the past 48 hours. -GI recommending if ongoing anticoagulation needed best to defer it for a couple more days and also recommending that aspirin not be restarted for at least another 5 days if need be otherwise recommending permanent avoidance of aspirin unless a clear indication for use due to patient's significant GI bleed.  -We will likely not resume anticoagulation on discharge no aspirin.  3.  Hypertension -BP stable.   -Continue Coreg, Lasix.   4.  History of chronic diastolic CHF -Patient denies any current shortness of breath.    -Patient noted early on in the hospitalization to have dependent edema per RN.  -Status post Lasix 20 mg IV x1 with urine output of 1.4 L.   -Continue home regimen of oral Lasix. -UOP 2.2 L over the past 24 hours.  5.  Hyperlipidemia -Not on statin prior to admission.   -Outpatient follow-up.    6.  Acute kidney  injury -Resolved.   7.  Hypernatremia -Resolved with hydration.  8.  Hypokalemia -Potassium at 3.5.   -K. Dur 40 mEq p.o. x1. -Place on K-Dur 20 mEq daily.    DVT prophylaxis: SCDs Code Status: DNR Family Communication: Updated patient.  No family at bedside.   Disposition:   Status is: Inpatient    Dispo: The patient is from: [facility] facility              Anticipated d/c is to: Likely SNF              Patient currently undergoing work-up for GI bleed, status posttransfusion 5 units packed red blood cells.  Stable for discharge.  Awaiting insurance authorization for placement at SNF.     Difficult to place patient no       Consultants:   Orthopedics: Dr.Adair 10/17/2020  Gastroenterology: Dr. Matthias Hughs 10/19/2020  Procedures:   CT abdomen and pelvis 10/20/2020  Tagged RBC bleeding scan 10/19/2020  2D echo 10/20/2020  Plain films of the right hip and pelvis 10/17/2020  Upper endoscopy per Dr. Matthias Hughs 10/20/2020  Close reduction percutaneous screw fixation of right femoral neck fracture per Dr. Susa Simmonds, orthopedics 10/17/2020  Transfusion 5 units packed red blood cells 5/22-5/24/2022  Capsule endoscopy 10/21/2020  Antimicrobials:   IV clindamycin 10/17/2020>>>> 10/18/2020   Subjective: Patient c/o hip pain, HA. Patient with congestion and c/o postnasal drip. Doesn't like the food.  Objective: Vitals:   10/24/20 1444 10/24/20 2039 10/25/20 0500 10/25/20 0555  BP: (!) 163/64 137/70  (!) 162/70  Pulse: 85 90  86  Resp: 16 18  16   Temp: 98 F (36.7 C) 98 F (36.7 C)  98 F (36.7 C)  TempSrc: Oral Oral  Oral  SpO2: 97% 94%  96%  Weight:   55.3 kg   Height:        Intake/Output Summary (Last 24 hours) at 10/25/2020 1226 Last data filed at 10/25/2020 0900 Gross per 24 hour  Intake 240 ml  Output 1800 ml  Net -1560 ml   Filed Weights   10/22/20 0500 10/24/20 0500 10/25/20 0500  Weight: 57.1 kg 59.1 kg 55.3 kg    Examination: General exam: : NAD Respiratory system: Minimal expiratory wheezing.  No crackles.  Speaking in full sentences.  Normal respiratory effort.  Cardiovascular system: Regular rate and rhythm no murmurs rubs or gallops.  No JVD.  No lower extremity edema.  Gastrointestinal system: Abdomen soft,  nontender, nondistended, positive bowel sounds.  No rebound.  No guarding. Central nervous system: Alert and oriented. No focal neurological deficits. Extremities: Symmetric 5 x 5 power. Skin: No rashes, lesions or ulcers Psychiatry: Judgement and insight appear normal. Mood & affect appropriate.   Data Reviewed: I have personally reviewed following labs and imaging studies  CBC: Recent Labs  Lab 10/21/20 1013 10/22/20 0248 10/23/20 0554 10/24/20 0502 10/25/20 0520  WBC 10.4 9.5 8.5 8.3 7.3  NEUTROABS 7.2 5.6  --   --   --   HGB 9.1* 8.1* 8.7* 9.2* 9.3*  HCT 27.4* 25.1* 26.8* 28.0* 28.8*  MCV 92.3 94.4 93.1 94.9 96.0  PLT 171 169 208 225 234    Basic Metabolic Panel: Recent Labs  Lab 10/21/20 1013 10/22/20 0248 10/23/20 0554 10/24/20 0502 10/25/20 0520  NA 143 139 140 143 142  K 3.6 3.5 3.6 3.4* 3.5  CL 113* 110 107 105 103  CO2 25 24 30  32 32  GLUCOSE 97 93 98 102* 92  BUN 27* 20  15 13 12   CREATININE 0.89 0.68 0.82 0.94 0.88  CALCIUM 7.5* 7.4* 7.9* 8.0* 7.8*  MG 2.0 2.2  --   --   --     GFR: Estimated Creatinine Clearance: 28.2 mL/min (by C-G formula based on SCr of 0.88 mg/dL).  Liver Function Tests: No results for input(s): AST, ALT, ALKPHOS, BILITOT, PROT, ALBUMIN in the last 168 hours.  CBG: No results for input(s): GLUCAP in the last 168 hours.   Recent Results (from the past 240 hour(s))  Resp Panel by RT-PCR (Flu A&B, Covid) Nasopharyngeal Swab     Status: None   Collection Time: 10/17/20  9:55 AM   Specimen: Nasopharyngeal Swab; Nasopharyngeal(NP) swabs in vial transport medium  Result Value Ref Range Status   SARS Coronavirus 2 by RT PCR NEGATIVE NEGATIVE Final    Comment: (NOTE) SARS-CoV-2 target nucleic acids are NOT DETECTED.  The SARS-CoV-2 RNA is generally detectable in upper respiratory specimens during the acute phase of infection. The lowest concentration of SARS-CoV-2 viral copies this assay can detect is 138 copies/mL. A negative  result does not preclude SARS-Cov-2 infection and should not be used as the sole basis for treatment or other patient management decisions. A negative result may occur with  improper specimen collection/handling, submission of specimen other than nasopharyngeal swab, presence of viral mutation(s) within the areas targeted by this assay, and inadequate number of viral copies(<138 copies/mL). A negative result must be combined with clinical observations, patient history, and epidemiological information. The expected result is Negative.  Fact Sheet for Patients:  BloggerCourse.comhttps://www.fda.gov/media/152166/download  Fact Sheet for Healthcare Providers:  SeriousBroker.ithttps://www.fda.gov/media/152162/download  This test is no t yet approved or cleared by the [place] FDA and  has been authorized for detection and/or diagnosis of SARS-CoV-2 by FDA under an Emergency Use Authorization (EUA). This EUA will remain  in effect (meaning this test can be used) for the duration of the COVID-19 declaration under Section 564(b)(1) of the Act, 21 U.S.C.section 360bbb-3(b)(1), unless the authorization is terminated  or revoked sooner.       Influenza A by PCR NEGATIVE NEGATIVE Final   Influenza B by PCR NEGATIVE NEGATIVE Final    Comment: (NOTE) The Xpert Xpress SARS-CoV-2/FLU/RSV plus assay is intended as an aid in the diagnosis of influenza from Nasopharyngeal swab specimens and should not be used as a sole basis for treatment. Nasal washings and aspirates are unacceptable for Xpert Xpress SARS-CoV-2/FLU/RSV testing.  Fact Sheet for Patients: BloggerCourse.comhttps://www.fda.gov/media/152166/download  Fact Sheet for Healthcare Providers: SeriousBroker.ithttps://www.fda.gov/media/152162/download  This test is not yet approved or cleared by the [place] FDA and has been authorized for detection and/or diagnosis of SARS-CoV-2 by FDA under an Emergency Use Authorization (EUA). This EUA will remain in effect (meaning this test can be used)  for the duration of the COVID-19 declaration under Section 564(b)(1) of the Act, 21 U.S.C. section 360bbb-3(b)(1), unless the authorization is terminated or revoked.  Performed at [facility], [address] [place], [place] [address]   MRSA PCR Screening     Status: None   Collection Time: 10/19/20 11:11 AM   Specimen: Nasopharyngeal  Result Value Ref Range Status   MRSA by PCR NEGATIVE NEGATIVE Final    Comment:        The GeneXpert MRSA Assay (FDA approved for NASAL specimens only), is one component of a comprehensive MRSA colonization surveillance program. It is not intended to diagnose MRSA infection nor to guide or monitor treatment for MRSA infections. Performed at  [facility], [address] [place], [place] [address]   SARS CORONAVIRUS 2 (TAT 6-24 HRS) Nasopharyngeal Nasopharyngeal Swab     Status: None   Collection Time: 10/23/20 11:38 AM   Specimen: Nasopharyngeal Swab  Result Value Ref Range Status   SARS Coronavirus 2 NEGATIVE NEGATIVE Final    Comment: (NOTE) SARS-CoV-2 target nucleic acids are NOT DETECTED.  The SARS-CoV-2 RNA is generally detectable in upper and lower respiratory specimens during the acute phase of infection. Negative results do not preclude SARS-CoV-2 infection, do not rule out co-infections with other pathogens, and should not be used as the sole basis for treatment or other patient management decisions. Negative results must be combined with clinical observations, patient history, and epidemiological information. The expected result is Negative.  Fact Sheet for Patients: HairSlick.no  Fact Sheet for Healthcare Providers: quierodirigir.com  This test is not yet approved or cleared by the [place] FDA and  has been authorized for detection and/or diagnosis of SARS-CoV-2 by FDA under an Emergency Use Authorization (EUA). This EUA will  remain  in effect (meaning this test can be used) for the duration of the COVID-19 declaration under Se ction 564(b)(1) of the Act, 21 U.S.C. section 360bbb-3(b)(1), unless the authorization is terminated or revoked sooner.  Performed at [facility], [address], [place], [place] [address]          Radiology Studies: No results found.      Scheduled Meds: . carvedilol  3.125 mg Oral BID WC  . chlorhexidine  15 mL Mouth Rinse BID  . Chlorhexidine Gluconate Cloth  6 each Topical Daily  . docusate sodium  100 mg Oral BID  . feeding supplement  1 Container Oral Q24H  . fluticasone  1 spray Each Nare Daily  . furosemide  20 mg Oral Daily  . loratadine  10 mg Oral Daily  . mouth rinse  15 mL Mouth Rinse q12n4p  . multivitamin with minerals  1 tablet Oral Daily  . pantoprazole  40 mg Oral Q0600  . polyethylene glycol  17 g Oral BID  . sertraline  75 mg Oral Daily   Continuous Infusions: . sodium chloride 20 mL/hr at 10/25/20 0836  . lactated ringers 10 mL/hr at 10/20/20 0946     LOS: 8 days    Time spent: 35 minutes    Ramiro Harvest, MD [facility]   To contact the attending provider between 7A-7P or the covering provider during after hours 7P-7A, please log into the web site [email or web address] and access using universal [place] password for that web site. If you do not have the password, please call the hospital operator.  10/25/2020, 12:26 PM

## 2020-10-26 ENCOUNTER — Encounter (HOSPITAL_COMMUNITY): Payer: Self-pay | Admitting: Internal Medicine

## 2020-10-26 DIAGNOSIS — D62 Acute posthemorrhagic anemia: Secondary | ICD-10-CM | POA: Diagnosis not present

## 2020-10-26 DIAGNOSIS — N179 Acute kidney failure, unspecified: Secondary | ICD-10-CM | POA: Diagnosis not present

## 2020-10-26 DIAGNOSIS — S72001D Fracture of unspecified part of neck of right femur, subsequent encounter for closed fracture with routine healing: Secondary | ICD-10-CM | POA: Diagnosis not present

## 2020-10-26 DIAGNOSIS — I5032 Chronic diastolic (congestive) heart failure: Secondary | ICD-10-CM | POA: Diagnosis not present

## 2020-10-26 LAB — CBC
HCT: 29.5 % — ABNORMAL LOW (ref 36.0–46.0)
Hemoglobin: 9.3 g/dL — ABNORMAL LOW (ref 12.0–15.0)
MCH: 30.7 pg (ref 26.0–34.0)
MCHC: 31.5 g/dL (ref 30.0–36.0)
MCV: 97.4 fL (ref 80.0–100.0)
Platelets: 256 10*3/uL (ref 150–400)
RBC: 3.03 MIL/uL — ABNORMAL LOW (ref 3.87–5.11)
RDW: 18.4 % — ABNORMAL HIGH (ref 11.5–15.5)
WBC: 6.9 10*3/uL (ref 4.0–10.5)
nRBC: 0 % (ref 0.0–0.2)

## 2020-10-26 LAB — BASIC METABOLIC PANEL
Anion gap: 6 (ref 5–15)
BUN: 16 mg/dL (ref 8–23)
CO2: 32 mmol/L (ref 22–32)
Calcium: 7.8 mg/dL — ABNORMAL LOW (ref 8.9–10.3)
Chloride: 104 mmol/L (ref 98–111)
Creatinine, Ser: 0.98 mg/dL (ref 0.44–1.00)
GFR, Estimated: 53 mL/min — ABNORMAL LOW (ref >60)
Glucose, Bld: 92 mg/dL (ref 70–99)
Potassium: 3.6 mmol/L (ref 3.5–5.1)
Sodium: 142 mmol/L (ref 135–145)

## 2020-10-26 MED ORDER — POTASSIUM CHLORIDE CRYS ER 10 MEQ PO TBCR
10.0000 meq | EXTENDED_RELEASE_TABLET | Freq: Every day | ORAL | Status: DC
  Administered 2020-10-26 – 2020-10-27 (×2): 10 meq via ORAL
  Filled 2020-10-26 (×2): qty 1

## 2020-10-26 NOTE — Progress Notes (Signed)
PROGRESS NOTE    Karen Mendez  VQQ:[id number] DOB: August 16, 1922 DOA: 10/17/2020 PCP: [name], MD    Chief Complaint  Patient presents with  . Fall    Brief Narrative:   87 year old female lives at [facility] status post mechanical fall and right hip fracture.  She is now status post repair.  Her past medical history includes hypertension fibromyalgia recurrent falls hyperlipidemia and degenerative joint disease.  Patient reports that she has not walked much for the past year after she had a fall with several rib fractures and uses wheelchair to get around.  10/20/2020 summary-patient was found to have large amounts of loose dark stools which was guaiac positive.  She was transfused 5 units of packed RBC, bite of that her expected.  She had a tagged RBC scan which was negative.  EGD done on 10/20/2020 which showed no signs of active bleeding.  Hb 10 from 6.7 (she received 5 units prbc)   Assessment & Plan:   Principal Problem:   Hip fracture (HCC) Active Problems:   Essential hypertension   Mixed hyperlipidemia   Fall   Gastrointestinal hemorrhage   ABLA (acute blood loss anemia)   Chronic diastolic CHF (congestive heart failure) (HCC)   Hypernatremia   AKI (acute kidney injury) (HCC)   1 nondisplaced right femoral neck fracture and displaced fractures to the medial right inferior pubic ramus status post mechanical fall -Patient seen in consultation by orthopedics. -Patient underwent closed reduction and percutaneous screw fixation of right femoral neck fracture per orthopedics.- -WBAT per orthopedics -Continue scheduled Tylenol. -Limit narcotics. -PT recommending SNF on discharge. -Awaiting insurance authorization for SNF placement. -Outpatient follow-up with orthopedics for wound check and suture removal and x-rays.  2.  Acute GI bleed/acute blood loss anemia -Questionable etiology. -Status post transfusion 5 units packed red blood cells during this  hospitalization. -Hemoglobin currently at at 9.3.   -Increase hemoglobin likely secondary to resumption of diuretics. -Status post IV Feraheme. -Anemia panel from 11/04/2019 with iron level of 74, TIBC of 245, folate of 8.8. -CT abdomen and pelvis which was done negative for any masses or diverticulosis -Bleeding scan negative for any source of bleeding. -Upper endoscopy negative for any source of bleeding. -Capsule endoscopy negative per GI -Stools are clearing up. -Patient tolerating current diet . -IV PPI has been transitioned to oral PPI.  -Per GI quiescent GI bleed of undetermined origin.   -GI recommending hold off colonoscopy due to patient's advanced age, clinical stability, improving GI bleed. -Hemoglobin currently stable, patient with no GI bleed over the past 48 hours. -GI recommending if ongoing anticoagulation needed best to defer it for a couple more days and also recommending that aspirin not be restarted for at least another 5 days if need be otherwise recommending permanent avoidance of aspirin unless a clear indication for use due to patient's significant GI bleed.  -We will likely not resume anticoagulation or aspirin on discharge.   3.  Hypertension -Coreg, Lasix.   4.  History of chronic diastolic CHF -Currently euvolemic.   -Patient noted early on in the hospitalization to have dependent edema per RN.  -Status post Lasix 20 mg IV x1 with urine output of 1.4 L.   -Currently on home regimen of oral Lasix with a urine output of 1.7 L over the past 24 hours .  5.  Hyperlipidemia -Not on statin prior to admission.   -Outpatient follow-up.    6.  Acute kidney injury -Resolved.   7.  Hypernatremia -Resolved with hydration.  8.  Hypokalemia -Repleted.  Potassium at 3.6    DVT prophylaxis: SCDs Code Status: DNR Family Communication: Updated patient.  No family at bedside.  Disposition:   Status is: Inpatient    Dispo: The patient is from: [facility]  facility              Anticipated d/c is to: Likely SNF              Patient currently undergoing work-up for GI bleed, status posttransfusion 5 units packed red blood cells.  Stable for discharge.  Awaiting insurance authorization for placement at SNF.     Difficult to place patient no       Consultants:   Orthopedics: Dr.Adair 10/17/2020  Gastroenterology: Dr. Matthias HughsBuccini 10/19/2020  Procedures:   CT abdomen and pelvis 10/20/2020  Tagged RBC bleeding scan 10/19/2020  2D echo 10/20/2020  Plain films of the right hip and pelvis 10/17/2020  Upper endoscopy per Dr. Matthias HughsBuccini 10/20/2020  Close reduction percutaneous screw fixation of right femoral neck fracture per Dr. Susa SimmondsAdair, orthopedics 10/17/2020  Transfusion 5 units packed red blood cells 5/22-5/24/2022  Capsule endoscopy 10/21/2020  Antimicrobials:   IV clindamycin 10/17/2020>>>> 10/18/2020   Subjective: Sitting up in bed.  No chest pain.  No shortness of breath.  No abdominal pain.   Complaining of some left hip pain.  States congestion has improved.   Objective: Vitals:   10/25/20 1758 10/25/20 2009 10/26/20 0500 10/26/20 0606  BP: (!) 167/56 (!) 130/51  (!) 151/60  Pulse: 83 87  85  Resp:  18  17  Temp:  98.9 F (37.2 C)  97.9 F (36.6 C)  TempSrc:  Oral  Oral  SpO2:  94%  96%  Weight:   55.9 kg   Height:        Intake/Output Summary (Last 24 hours) at 10/26/2020 1102 Last data filed at 10/26/2020 0834 Gross per 24 hour  Intake 832 ml  Output 1500 ml  Net -668 ml   Filed Weights   10/24/20 0500 10/25/20 0500 10/26/20 0500  Weight: 59.1 kg 55.3 kg 55.9 kg    Examination: General exam: : NAD Respiratory system: CTA B.  No wheezes, no rhonchi.  Speaking in full sentences.  Normal respiratory effort. Cardiovascular system: Regular rate and rhythm no murmurs rubs or gallops.  No JVD.  No lower extremity edema.  Gastrointestinal system: Abdomen soft, nontender, nondistended, positive bowel sounds.  No rebound.  No  guarding. Central nervous system: Alert and oriented. No focal neurological deficits. Extremities: Symmetric 5 x 5 power. Skin: No rashes, lesions or ulcers Psychiatry: Judgement and insight appear normal. Mood & affect appropriate.  Data Reviewed: I have personally reviewed following labs and imaging studies  CBC: Recent Labs  Lab 10/21/20 1013 10/22/20 0248 10/23/20 0554 10/24/20 0502 10/25/20 0520 10/26/20 0605  WBC 10.4 9.5 8.5 8.3 7.3 6.9  NEUTROABS 7.2 5.6  --   --   --   --   HGB 9.1* 8.1* 8.7* 9.2* 9.3* 9.3*  HCT 27.4* 25.1* 26.8* 28.0* 28.8* 29.5*  MCV 92.3 94.4 93.1 94.9 96.0 97.4  PLT 171 169 208 225 234 256    Basic Metabolic Panel: Recent Labs  Lab 10/21/20 1013 10/22/20 0248 10/23/20 0554 10/24/20 0502 10/25/20 0520 10/26/20 0605  NA 143 139 140 143 142 142  K 3.6 3.5 3.6 3.4* 3.5 3.6  CL 113* 110 107 105 103 104  CO2 25 24 30  32 32 32  GLUCOSE 97 93 98 102* 92 92  BUN 27* 20 15 13 12 16   CREATININE 0.89 0.68 0.82 0.94 0.88 0.98  CALCIUM 7.5* 7.4* 7.9* 8.0* 7.8* 7.8*  MG 2.0 2.2  --   --   --   --     GFR: Estimated Creatinine Clearance: 25.3 mL/min (by C-G formula based on SCr of 0.98 mg/dL).  Liver Function Tests: No results for input(s): AST, ALT, ALKPHOS, BILITOT, PROT, ALBUMIN in the last 168 hours.  CBG: No results for input(s): GLUCAP in the last 168 hours.   Recent Results (from the past 240 hour(s))  Resp Panel by RT-PCR (Flu A&B, Covid) Nasopharyngeal Swab     Status: None   Collection Time: 10/17/20  9:55 AM   Specimen: Nasopharyngeal Swab; Nasopharyngeal(NP) swabs in vial transport medium  Result Value Ref Range Status   SARS Coronavirus 2 by RT PCR NEGATIVE NEGATIVE Final    Comment: (NOTE) SARS-CoV-2 target nucleic acids are NOT DETECTED.  The SARS-CoV-2 RNA is generally detectable in upper respiratory specimens during the acute phase of infection. The lowest concentration of SARS-CoV-2 viral copies this assay can detect  is 138 copies/mL. A negative result does not preclude SARS-Cov-2 infection and should not be used as the sole basis for treatment or other patient management decisions. A negative result may occur with  improper specimen collection/handling, submission of specimen other than nasopharyngeal swab, presence of viral mutation(s) within the areas targeted by this assay, and inadequate number of viral copies(<138 copies/mL). A negative result must be combined with clinical observations, patient history, and epidemiological information. The expected result is Negative.  Fact Sheet for Patients:  10/19/20  Fact Sheet for Healthcare Providers:  BloggerCourse.com  This test is no t yet approved or cleared by the [place] FDA and  has been authorized for detection and/or diagnosis of SARS-CoV-2 by FDA under an Emergency Use Authorization (EUA). This EUA will remain  in effect (meaning this test can be used) for the duration of the COVID-19 declaration under Section 564(b)(1) of the Act, 21 U.S.C.section 360bbb-3(b)(1), unless the authorization is terminated  or revoked sooner.       Influenza A by PCR NEGATIVE NEGATIVE Final   Influenza B by PCR NEGATIVE NEGATIVE Final    Comment: (NOTE) The Xpert Xpress SARS-CoV-2/FLU/RSV plus assay is intended as an aid in the diagnosis of influenza from Nasopharyngeal swab specimens and should not be used as a sole basis for treatment. Nasal washings and aspirates are unacceptable for Xpert Xpress SARS-CoV-2/FLU/RSV testing.  Fact Sheet for Patients: [place]  Fact Sheet for Healthcare Providers: BloggerCourse.com  This test is not yet approved or cleared by the [place] FDA and has been authorized for detection and/or diagnosis of SARS-CoV-2 by FDA under an Emergency Use Authorization (EUA). This EUA will remain in effect  (meaning this test can be used) for the duration of the COVID-19 declaration under Section 564(b)(1) of the Act, 21 U.S.C. section 360bbb-3(b)(1), unless the authorization is terminated or revoked.  Performed at [facility], [address] [place], [place]   MRSA PCR Screening     Status: None   Collection Time: 10/19/20 11:11 AM   Specimen: Nasopharyngeal  Result Value Ref Range Status   MRSA by PCR NEGATIVE NEGATIVE Final    Comment:        The GeneXpert MRSA Assay (FDA approved for NASAL specimens only), is one component of a comprehensive MRSA colonization surveillance program. It is  not intended to diagnose MRSA infection nor to guide or monitor treatment for MRSA infections. Performed at [facility], [address], [place], [place] [address]   SARS CORONAVIRUS 2 (TAT 6-24 HRS) Nasopharyngeal Nasopharyngeal Swab     Status: None   Collection Time: 10/23/20 11:38 AM   Specimen: Nasopharyngeal Swab  Result Value Ref Range Status   SARS Coronavirus 2 NEGATIVE NEGATIVE Final    Comment: (NOTE) SARS-CoV-2 target nucleic acids are NOT DETECTED.  The SARS-CoV-2 RNA is generally detectable in upper and lower respiratory specimens during the acute phase of infection. Negative results do not preclude SARS-CoV-2 infection, do not rule out co-infections with other pathogens, and should not be used as the sole basis for treatment or other patient management decisions. Negative results must be combined with clinical observations, patient history, and epidemiological information. The expected result is Negative.  Fact Sheet for Patients: HairSlick.no  Fact Sheet for Healthcare Providers: quierodirigir.com  This test is not yet approved or cleared by the [place] FDA and  has been authorized for detection and/or diagnosis of SARS-CoV-2 by FDA under an Emergency Use  Authorization (EUA). This EUA will remain  in effect (meaning this test can be used) for the duration of the COVID-19 declaration under Se ction 564(b)(1) of the Act, 21 U.S.C. section 360bbb-3(b)(1), unless the authorization is terminated or revoked sooner.  Performed at [facility], [address], [place], [place] [address]          Radiology Studies: No results found.      Scheduled Meds: . carvedilol  3.125 mg Oral BID WC  . chlorhexidine  15 mL Mouth Rinse BID  . Chlorhexidine Gluconate Cloth  6 each Topical Daily  . docusate sodium  100 mg Oral BID  . feeding supplement  1 Container Oral Q24H  . fluticasone  2 spray Each Nare Daily  . furosemide  20 mg Oral Daily  . loratadine  10 mg Oral Daily  . mouth rinse  15 mL Mouth Rinse q12n4p  . multivitamin with minerals  1 tablet Oral Daily  . pantoprazole  40 mg Oral Q0600  . polyethylene glycol  17 g Oral BID  . potassium chloride  10 mEq Oral Daily  . sertraline  75 mg Oral Daily   Continuous Infusions: . sodium chloride 20 mL/hr at 10/25/20 0836     LOS: 9 days    Time spent: 35 minutes    Ramiro Harvest, MD [facility]   To contact the attending provider between 7A-7P or the covering provider during after hours 7P-7A, please log into the web site [email or web address] and access using universal [place] password for that web site. If you do not have the password, please call the hospital operator.  10/26/2020, 11:02 AM

## 2020-10-26 NOTE — Progress Notes (Signed)
Physical Therapy Treatment Patient Details Name: Karen Mendez MRN: [id number] DOB: 03/06/1923 Today's Date: 10/26/2020    History of Present Illness Pt s/p fall from bed at [facility] 10/17/20 and with R femoral neck fx and nondisplaced medial R and inferior pubic ramus fx.  Pt now s/p percutaneous screw fixation of femoral neck 10/17/20.  Pt with hx of Fibromyalgia macular degeneration, DDD and multiple falls.  General Comments: AxO x 3 very pleasant and willing "I can't walk" Assisted OOB to recliner only.  General transfer comment: pt unable to attempt tradisional sit to stand with walker due to fear/pain.  "Bear Hug" 1/4 pivot from bed to recliner with some fear/anxiety General Gait Details: Pt nonambulatory prior to fall  PT Comments      Follow Up Recommendations  SNF     Equipment Recommendations  None recommended by PT    Recommendations for Other Services       Precautions / Restrictions Precautions Precautions: Fall Restrictions Weight Bearing Restrictions: No Other Position/Activity Restrictions: WBAT-xfers only    Mobility  Bed Mobility Overal bed mobility: Needs Assistance Bed Mobility: Supine to Sit     Supine to sit: Max assist     General bed mobility comments: increased assist this session due to pain using bed pad to complete scooting    Transfers Overall transfer level: Needs assistance Equipment used: None Transfers: Stand Pivot Transfers   Stand pivot transfers: Max assist       General transfer comment: pt unable to attempt tradisional sit to stand with walker due to fear/pain.  "Bear Hug" 1/4 pivot from bed to recliner with some fear/anxiety  Ambulation/Gait             General Gait Details: Pt nonambulatory prior to fall   Stairs             Wheelchair Mobility    Modified Rankin (Stroke Patients Only)       Balance                                            Cognition Arousal/Alertness:  Awake/alert Behavior During Therapy: WFL for tasks assessed/performed Overall Cognitive Status: Within Functional Limits for tasks assessed                                 General Comments: AxO x 3 very pleasant and willing "I can't walk"      Exercises      General Comments        Pertinent Vitals/Pain Pain Assessment: Faces Faces Pain Scale: Hurts whole lot Pain Location: R hip with activity Pain Descriptors / Indicators: Discomfort;Grimacing;Sore;Guarding;Aching Pain Intervention(s): Monitored during session;Repositioned    Home Living                      Prior Function            PT Goals (current goals can now be found in the care plan section) Progress towards PT goals: Progressing toward goals    Frequency    Min 3X/week      PT Plan Frequency needs to be updated    Co-evaluation              AM-PAC PT "6 Clicks" Mobility   Outcome Measure  Help needed turning  from your back to your side while in a flat bed without using bedrails?: A Lot Help needed moving from lying on your back to sitting on the side of a flat bed without using bedrails?: Total Help needed moving to and from a bed to a chair (including a wheelchair)?: Total Help needed standing up from a chair using your arms (e.g., wheelchair or bedside chair)?: Total Help needed to walk in hospital room?: Total Help needed climbing 3-5 steps with a railing? : Total 6 Click Score: 7    End of Session Equipment Utilized During Treatment: Gait belt Activity Tolerance: Patient limited by pain Patient left: in chair;with chair alarm set;with call bell/phone within reach Nurse Communication: Mobility status PT Visit Diagnosis: Difficulty in walking, not elsewhere classified (R26.2);Pain;Muscle weakness (generalized) (M62.81);History of falling (Z91.81) Pain - Right/Left: Right Pain - part of body: Hip     Time: 2130-8657 PT Time Calculation (min) (ACUTE ONLY): 18  min  Charges:  $Therapeutic Activity: 8-22 mins                     Felecia Shelling  PTA Acute  Rehabilitation Services Pager      [phone] Office      [phone]

## 2020-10-26 NOTE — Care Management Important Message (Signed)
Medicare IM given to the patient by Cloie Wooden. 

## 2020-10-27 DIAGNOSIS — R2681 Unsteadiness on feet: Secondary | ICD-10-CM | POA: Diagnosis not present

## 2020-10-27 DIAGNOSIS — R262 Difficulty in walking, not elsewhere classified: Secondary | ICD-10-CM | POA: Diagnosis not present

## 2020-10-27 DIAGNOSIS — E878 Other disorders of electrolyte and fluid balance, not elsewhere classified: Secondary | ICD-10-CM | POA: Diagnosis not present

## 2020-10-27 DIAGNOSIS — S72044A Nondisplaced fracture of base of neck of right femur, initial encounter for closed fracture: Secondary | ICD-10-CM | POA: Diagnosis not present

## 2020-10-27 DIAGNOSIS — I959 Hypotension, unspecified: Secondary | ICD-10-CM | POA: Diagnosis not present

## 2020-10-27 DIAGNOSIS — R41841 Cognitive communication deficit: Secondary | ICD-10-CM | POA: Diagnosis not present

## 2020-10-27 DIAGNOSIS — I5032 Chronic diastolic (congestive) heart failure: Secondary | ICD-10-CM | POA: Diagnosis not present

## 2020-10-27 DIAGNOSIS — R410 Disorientation, unspecified: Secondary | ICD-10-CM | POA: Diagnosis not present

## 2020-10-27 DIAGNOSIS — W19XXXA Unspecified fall, initial encounter: Secondary | ICD-10-CM | POA: Diagnosis not present

## 2020-10-27 DIAGNOSIS — K921 Melena: Secondary | ICD-10-CM | POA: Diagnosis not present

## 2020-10-27 DIAGNOSIS — S2232XD Fracture of one rib, left side, subsequent encounter for fracture with routine healing: Secondary | ICD-10-CM | POA: Diagnosis not present

## 2020-10-27 DIAGNOSIS — D62 Acute posthemorrhagic anemia: Secondary | ICD-10-CM | POA: Diagnosis not present

## 2020-10-27 DIAGNOSIS — E785 Hyperlipidemia, unspecified: Secondary | ICD-10-CM | POA: Diagnosis not present

## 2020-10-27 DIAGNOSIS — R296 Repeated falls: Secondary | ICD-10-CM | POA: Diagnosis not present

## 2020-10-27 DIAGNOSIS — R4189 Other symptoms and signs involving cognitive functions and awareness: Secondary | ICD-10-CM | POA: Diagnosis not present

## 2020-10-27 DIAGNOSIS — F4321 Adjustment disorder with depressed mood: Secondary | ICD-10-CM | POA: Diagnosis not present

## 2020-10-27 DIAGNOSIS — R5381 Other malaise: Secondary | ICD-10-CM | POA: Diagnosis not present

## 2020-10-27 DIAGNOSIS — J449 Chronic obstructive pulmonary disease, unspecified: Secondary | ICD-10-CM | POA: Diagnosis not present

## 2020-10-27 DIAGNOSIS — L89100 Pressure ulcer of unspecified part of back, unstageable: Secondary | ICD-10-CM | POA: Diagnosis not present

## 2020-10-27 DIAGNOSIS — R531 Weakness: Secondary | ICD-10-CM | POA: Diagnosis not present

## 2020-10-27 DIAGNOSIS — N179 Acute kidney failure, unspecified: Secondary | ICD-10-CM | POA: Diagnosis not present

## 2020-10-27 DIAGNOSIS — I509 Heart failure, unspecified: Secondary | ICD-10-CM | POA: Diagnosis not present

## 2020-10-27 DIAGNOSIS — S72001A Fracture of unspecified part of neck of right femur, initial encounter for closed fracture: Secondary | ICD-10-CM | POA: Diagnosis not present

## 2020-10-27 DIAGNOSIS — Z4789 Encounter for other orthopedic aftercare: Secondary | ICD-10-CM | POA: Diagnosis not present

## 2020-10-27 DIAGNOSIS — R52 Pain, unspecified: Secondary | ICD-10-CM | POA: Diagnosis not present

## 2020-10-27 DIAGNOSIS — R2689 Other abnormalities of gait and mobility: Secondary | ICD-10-CM | POA: Diagnosis not present

## 2020-10-27 DIAGNOSIS — M6281 Muscle weakness (generalized): Secondary | ICD-10-CM | POA: Diagnosis not present

## 2020-10-27 DIAGNOSIS — I10 Essential (primary) hypertension: Secondary | ICD-10-CM | POA: Diagnosis not present

## 2020-10-27 DIAGNOSIS — S72001D Fracture of unspecified part of neck of right femur, subsequent encounter for closed fracture with routine healing: Secondary | ICD-10-CM | POA: Diagnosis not present

## 2020-10-27 LAB — BASIC METABOLIC PANEL
Anion gap: 5 (ref 5–15)
BUN: 17 mg/dL (ref 8–23)
CO2: 30 mmol/L (ref 22–32)
Calcium: 7.6 mg/dL — ABNORMAL LOW (ref 8.9–10.3)
Chloride: 105 mmol/L (ref 98–111)
Creatinine, Ser: 0.98 mg/dL (ref 0.44–1.00)
GFR, Estimated: 53 mL/min — ABNORMAL LOW (ref >60)
Glucose, Bld: 98 mg/dL (ref 70–99)
Potassium: 3.8 mmol/L (ref 3.5–5.1)
Sodium: 140 mmol/L (ref 135–145)

## 2020-10-27 LAB — CBC
HCT: 28.4 % — ABNORMAL LOW (ref 36.0–46.0)
Hemoglobin: 9.2 g/dL — ABNORMAL LOW (ref 12.0–15.0)
MCH: 31.5 pg (ref 26.0–34.0)
MCHC: 32.4 g/dL (ref 30.0–36.0)
MCV: 97.3 fL (ref 80.0–100.0)
Platelets: 249 10*3/uL (ref 150–400)
RBC: 2.92 MIL/uL — ABNORMAL LOW (ref 3.87–5.11)
RDW: 18.5 % — ABNORMAL HIGH (ref 11.5–15.5)
WBC: 6.1 10*3/uL (ref 4.0–10.5)
nRBC: 0 % (ref 0.0–0.2)

## 2020-10-27 MED ORDER — PANTOPRAZOLE SODIUM 40 MG PO TBEC: 40.0000 mg | DELAYED_RELEASE_TABLET | Freq: Every day | ORAL | Status: AC

## 2020-10-27 MED ORDER — POLYETHYLENE GLYCOL 3350 17 G PO PACK: 17.0000 g | PACK | Freq: Every day | ORAL | 0 refills | Status: AC

## 2020-10-27 MED ORDER — SENNA 8.6 MG PO TABS: 1.0000 | ORAL_TABLET | Freq: Two times a day (BID) | ORAL | 0 refills | Status: AC

## 2020-10-27 MED ORDER — ACETAMINOPHEN 500 MG PO TABS: 1000.0000 mg | ORAL_TABLET | Freq: Three times a day (TID) | ORAL | 0 refills | Status: AC | PRN

## 2020-10-27 NOTE — Progress Notes (Signed)
Attempted to call [facility] to given report at the phone number provided by TOC. No answer. Will reattempt in 20 minutes.

## 2020-10-27 NOTE — TOC Progression Note (Signed)
Transition of Care [facility]) - Progression Note    Patient Details  Name: Karen Mendez MRN: [id number] Date of Birth: [birth year]  Transition of [facility]) CM/SW Contact  Ida Rogue, [place] Phone Number: 10/27/2020, 11:19 AM  Clinical Narrative:   Patient who is stable for d/c has a bed at [facility] today.  Family alerted.  PTAR arranged.  Nursing, please call report to [phone], room 706P. TOC sign off.    Expected Discharge Plan: Skilled Nursing Facility Barriers to Discharge: Barriers Resolved  Expected Discharge Plan and Services Expected Discharge Plan: Skilled Nursing Facility In-house Referral: Clinical Social Work   Post Acute Care Choice: Skilled Nursing Facility Living arrangements for the past 2 months: Assisted Living Facility                 DME Arranged: N/A DME Agency: NA                   Social Determinants of Health (SDOH) Interventions    Readmission Risk Interventions No flowsheet data found.

## 2020-10-27 NOTE — Discharge Summary (Signed)
Physician Discharge Summary  Karen Mendez SJG:[id number] DOB: [birth year] DOA: 10/17/2020  PCP: [name], MD  Admit date: 10/17/2020 Discharge date: 10/27/2020  Admitted From: SNF Disposition:  SNF  Discharge Condition:Stable CODE STATUS: DNR Diet recommendation: Regular  Brief/Interim Summary: Patient is a 87 year old female lives at [facility] [place] came with status post mechanical fall and right hip fracture.  Her past medical history includes hypertension fibromyalgia recurrent falls hyperlipidemia and degenerative joint disease.  She underwent ORIF for right hip fracture.  Post surgery, patient was found to have large amounts of loose dark stools which was guaiac positive. She was transfused 5units of packed RBC. She had a tagged RBC scan which was negative. EGD done on 10/20/2020 which showed no signs of active bleeding.  GI was following.  Currently her hemoglobin has remained stable.  Aspirin discontinued.  She is medically stable for discharge to skilled facility today.  Following problems were addressed during her hospitalization:  1.nondisplaced right femoral neck fracture and displaced fractures to the medial right inferior pubic ramus status post mechanical fall -Patient seen in consultation by orthopedics. -Patient underwent closed reduction and percutaneous screw fixation of right femoral neck fracture per orthopedics.- -Limit narcotics. -PT recommending SNF on discharge. -Outpatient follow-up with orthopedics for wound check and suture removal and x-rays in a week  2.  Acute GI bleed/acute blood loss anemia -Questionable etiology. -Status post transfusion 5 units packed red blood cells during this hospitalization. -Hemoglobin currently at 9.   -Status post IV Feraheme. -CT abdomen and pelvis which was done negative for any masses or diverticulosis -Bleeding scan negative for any source of bleeding. -Upper endoscopy negative for any source of  bleeding. -Capsule endoscopy negative per GI -Patient tolerating current diet . -IV PPI has been transitioned to oral PPI.  -Per GI quiescent GI bleed of undetermined origin.   -GI recommending hold off colonoscopy due to patient's advanced age, clinical stability, improving GI bleed. -GI recommending not resume anticoagulation or aspirin on discharge.   3.  Hypertension -On Coreg, Lasix.   4.  History of chronic diastolic CHF -Currently euvolemic.  .  -Currently on home regimen of oral Lasix.  5.  Hyperlipidemia -Not on statin prior to admission.   -Outpatient follow-up.    6.  Acute kidney injury -Resolved.   7.  Hypernatremia -Resolved with hydration.  8.  Hypokalemia -Repleted and corrected   Discharge Diagnoses:  Principal Problem:   Hip fracture (HCC) Active Problems:   Essential hypertension   Mixed hyperlipidemia   Fall   Gastrointestinal hemorrhage   ABLA (acute blood loss anemia)   Chronic diastolic CHF (congestive heart failure) (HCC)   Hypernatremia   AKI (acute kidney injury) [facility])    Discharge Instructions  Discharge Instructions    Diet general   Complete by: As directed    Discharge instructions   Complete by: As directed    1)Please take prescribed medications as instructed 2)Do a CBC and BMP tests in a week 3)Follow up with orthopedics in a week.  Name and number of the provider has been attached.   No wound care   Complete by: As directed      Allergies as of 10/27/2020      Reactions   Etodolac Other (See Comments)   Nefazodone Other (See Comments)   Penicillins Rash   Sulfamethoxazole Rash      Medication List    STOP taking these medications   aspirin EC 81 MG tablet  TAKE these medications   acetaminophen 500 MG tablet Commonly known as: TYLENOL Take 2 tablets (1,000 mg total) by mouth every 8 (eight) hours as needed. What changed:   when to take this  reasons to take this   carvedilol 3.125 MG  tablet Commonly known as: COREG Take 1 tablet (3.125 mg total) by mouth 2 (two) times daily.   furosemide 20 MG tablet Commonly known as: LASIX Take 20 mg by mouth daily.   loratadine 10 MG tablet Commonly known as: CLARITIN Take 10 mg by mouth daily.   meclizine 12.5 MG tablet Commonly known as: ANTIVERT Take 12.5 mg by mouth 2 (two) times daily.   Ocular Vitamins Tabs Take 1 tablet by mouth 2 (two) times daily.   pantoprazole 40 MG tablet Commonly known as: PROTONIX Take 1 tablet (40 mg total) by mouth daily at 6 (six) AM. Start taking on: October 28, 2020   polyethylene glycol 17 g packet Commonly known as: MIRALAX / GLYCOLAX Take 17 g by mouth daily.   potassium chloride 10 MEQ CR capsule Commonly known as: MICRO-K Take 10 mEq by mouth daily.   senna 8.6 MG Tabs tablet Commonly known as: SENOKOT Take 1 tablet (8.6 mg total) by mouth 2 (two) times daily.   sertraline 50 MG tablet Commonly known as: ZOLOFT Take 75 mg by mouth daily.   Systane 0.4-0.3 % Soln Generic drug: Polyethyl Glycol-Propyl Glycol Place 2 drops into both eyes 2 (two) times daily.   vitamin B-12 1000 MCG tablet Commonly known as: CYANOCOBALAMIN Take 1 tablet (1,000 mcg total) by mouth daily.       Contact information for follow-up providers    Terance HartAdair, Christopher R, MD. Schedule an appointment as soon as possible for a visit in 2 weeks.   Specialty: Orthopedic Surgery Why: For suture removal Contact information: [address] [place] [address] [phone]            Contact information for after-discharge care    Destination    [facility] .   Service: Skilled Nursing Contact information: [address] [place] [id number] [phone]                 Allergies  Allergen Reactions  . Etodolac Other (See Comments)  . Nefazodone Other (See Comments)  . Penicillins Rash  . Sulfamethoxazole Rash     Consultations:  Orthopedics,GI   Procedures/Studies: CT ABDOMEN PELVIS WO CONTRAST  Result Date: 10/20/2020 CLINICAL DATA:  87 year old with abdominal pain.  GI bleeding. EXAM: CT ABDOMEN AND PELVIS WITHOUT CONTRAST TECHNIQUE: Multidetector CT imaging of the abdomen and pelvis was performed following the standard protocol without IV contrast. COMPARISON:  Nuclear medicine tagged red blood cell study 10/19/2020 FINDINGS: Lower chest: Trace pleural fluid, right side greater than left. Mild bronchiectasis in the lower lobes. Patchy densities at lung bases likely represent atelectasis or mild scarring. Hepatobiliary: No gross abnormality to the liver. Normal appearance of the gallbladder with mild distention. Pancreas: Unremarkable. No pancreatic ductal dilatation or surrounding inflammatory changes. Spleen: Normal in size without focal abnormality. Adrenals/Urinary Tract: Normal appearance of the adrenal glands. Mild distention of the urinary bladder with fluid. Normal appearance of the kidneys without hydronephrosis. No evidence for kidney stones. Stomach/Bowel: Oral contrast in the distal small bowel and colon. No evidence for obstruction. Normal appearance of the stomach. No focal bowel inflammation. Vascular/Lymphatic: Diffuse vascular calcifications throughout the abdomen and pelvis. Negative for an aortic aneurysm. Large number of mesenteric lymph  nodes throughout the central abdomen. These prominent mesenteric lymph nodes are best seen on the coronal images, sequence 5 image 24 and on the axial images, sequence 2 image 42. No significant lymph node enlargement around the aorta or iliac vessels. No significant inguinal lymph node enlargement. Reproductive: Uterus appears to be surgically absent. No evidence for an adnexal mass. Other: Diffuse subcutaneous edema. Evidence for presacral edema. Negative for free air. Trace fluid in the right paracolic gutter. Musculoskeletal: 3 surgical screws  extending through the proximal right femur. Fractures involving the right superior and inferior pubic rami. Mild anterolisthesis at L4-L5. Extensive facet arthropathy lumbar spine. Minimal retrolisthesis at L3-L4. Multilevel disc space narrowing and endplate disease in the lumbar spine. IMPRESSION: 1. No acute abnormality to explain abdominal pain. 2. Trace pleural fluid with subcutaneous edema and minimal fluid in the right paracolic gutter. 3. Prominent central mesenteric lymph nodes. Findings are nonspecific. This could represent reactive changes. Mild lymphoproliferative process cannot be completely excluded. 4. Right pubic rami fracture. Postoperative changes from right hip fracture and surgical fixation. 5.  Aortic Atherosclerosis (ICD10-I70.0). Electronically Signed   By: Richarda Overlie M.D.   On: 10/20/2020 17:15   NM GI Blood Loss  Result Date: 10/19/2020 CLINICAL DATA:  GI bleeding EXAM: NUCLEAR MEDICINE GASTROINTESTINAL BLEEDING SCAN TECHNIQUE: Sequential abdominal images were obtained following intravenous administration of Tc-15m labeled red blood cells. RADIOPHARMACEUTICALS:  23.6 mCi Tc-80m pertechnetate in-vitro labeled red cells. COMPARISON:  None. FINDINGS: Increased radiotracer activity in the left upper quadrant is suspected to reflect free pertechnetate with resulting accumulation in the normal gastric mucosa. Small amount of activity is noted about the left antecubital fossa likely related to injection site. No other conspicuous sites of radiotracer accumulation are seen in the abdomen or pelvis, specifically no convincing evidence of active GI bleeding. IMPRESSION: No convincing tracer accumulation to suggest a identifiable sites for active GI bleeding. Likely artifactually accumulation within the gastric mucosa often related to free pertechnetate. Activity projects towards the left antecubital fossa likely at the injection site. Electronically Signed   By: Kreg Shropshire M.D.   On: 10/19/2020  19:31   DG C-Arm 1-60 Min-No Report  Result Date: 10/17/2020 Fluoroscopy was utilized by the requesting physician.  No radiographic interpretation.   ECHOCARDIOGRAM COMPLETE  Result Date: 10/20/2020    ECHOCARDIOGRAM REPORT   Patient Name:   DESIRRE EICKHOFF Date of Exam: 10/20/2020 Medical Rec #:  [id number]       Height:       61.0 in Accession #:    [id number]      Weight:       126.1 lb Date of Birth:  21-Dec-1922        BSA:          1.553 m Patient Age:    89 years        BP:           165/38 mmHg Patient Gender: F               HR:           83 bpm. Exam Location:  Inpatient Procedure: 2D Echo, Cardiac Doppler and Color Doppler Indications:    R94.31 Abnormal EKG  History:        Patient has prior history of Echocardiogram examinations, most                 recent 11/05/2019. Risk Factors:Hypertension and Dyslipidemia.  Sonographer:    Roosvelt Maser RDCS Referring  Phys: [id number] ELIZABETH G MATHEWS IMPRESSIONS  1. Left ventricular ejection fraction, by estimation, is 55 to 60%. The left ventricle has normal function. The left ventricle has no regional wall motion abnormalities. Left ventricular diastolic parameters are consistent with Grade I diastolic dysfunction (impaired relaxation).  2. Right ventricular systolic function is normal. The right ventricular size is normal.  3. The mitral valve is normal in structure. Moderate mitral valve regurgitation. No evidence of mitral stenosis. Moderate mitral annular calcification.  4. The aortic valve is normal in structure. Aortic valve regurgitation is not visualized. No aortic stenosis is present.  5. The inferior vena cava is normal in size with greater than 50% respiratory variability, suggesting right atrial pressure of 3 mmHg. FINDINGS  Left Ventricle: Left ventricular ejection fraction, by estimation, is 55 to 60%. The left ventricle has normal function. The left ventricle has no regional wall motion abnormalities. The left ventricular internal cavity size was  normal in size. There is  no left ventricular hypertrophy. Left ventricular diastolic parameters are consistent with Grade I diastolic dysfunction (impaired relaxation). Right Ventricle: The right ventricular size is normal. No increase in right ventricular wall thickness. Right ventricular systolic function is normal. Left Atrium: Left atrial size was normal in size. Right Atrium: Right atrial size was normal in size. Pericardium: There is no evidence of pericardial effusion. Mitral Valve: The mitral valve is normal in structure. Moderate mitral annular calcification. Moderate mitral valve regurgitation. No evidence of mitral valve stenosis. Tricuspid Valve: The tricuspid valve is normal in structure. Tricuspid valve regurgitation is not demonstrated. No evidence of tricuspid stenosis. Aortic Valve: The aortic valve is normal in structure. Aortic valve regurgitation is not visualized. No aortic stenosis is present. Aortic valve mean gradient measures 6.0 mmHg. Aortic valve peak gradient measures 10.9 mmHg. Aortic valve area, by VTI measures 1.11 cm. Pulmonic Valve: The pulmonic valve was normal in structure. Pulmonic valve regurgitation is not visualized. No evidence of pulmonic stenosis. Aorta: The aortic root is normal in size and structure. Venous: The inferior vena cava is normal in size with greater than 50% respiratory variability, suggesting right atrial pressure of 3 mmHg. IAS/Shunts: No atrial level shunt detected by color flow Doppler.  LEFT VENTRICLE PLAX 2D LVIDd:         3.53 cm  Diastology LVIDs:         2.44 cm  LV e' medial:    3.81 cm/s LV PW:         1.01 cm  LV E/e' medial:  34.6 LV IVS:        1.04 cm  LV e' lateral:   8.16 cm/s LVOT diam:     1.70 cm  LV E/e' lateral: 16.2 LV SV:         43 LV SV Index:   27 LVOT Area:     2.27 cm  RIGHT VENTRICLE RV Basal diam:  3.46 cm LEFT ATRIUM             Index       RIGHT ATRIUM           Index LA diam:        4.80 cm 3.09 cm/m  RA Area:     13.90 cm  LA Vol (A2C):   33.0 ml 21.26 ml/m RA Volume:   35.10 ml  22.61 ml/m LA Vol (A4C):   46.4 ml 29.89 ml/m LA Biplane Vol: 40.4 ml 26.02 ml/m  AORTIC VALVE AV Area (Vmax):  1.27 cm AV Area (Vmean):   1.15 cm AV Area (VTI):     1.11 cm AV Vmax:           165.00 cm/s AV Vmean:          120.000 cm/s AV VTI:            0.383 m AV Peak Grad:      10.9 mmHg AV Mean Grad:      6.0 mmHg LVOT Vmax:         92.40 cm/s LVOT Vmean:        61.000 cm/s LVOT VTI:          0.188 m LVOT/AV VTI ratio: 0.49  AORTA Ao Root diam: 2.70 cm MITRAL VALVE MV Area (PHT): 7.44 cm     SHUNTS MV Decel Time: 102 msec     Systemic VTI:  0.19 m MV E velocity: 132.00 cm/s  Systemic Diam: 1.70 cm MV A velocity: 138.00 cm/s MV E/A ratio:  0.96 Donato Schultz MD Electronically signed by Donato Schultz MD Signature Date/Time: 10/20/2020/2:38:33 PM    Final    DG HIP OPERATIVE UNILAT W OR W/O PELVIS RIGHT  Result Date: 10/17/2020 CLINICAL DATA:  Right hip fracture repair. FLUOROSCOPY TIME:  46 seconds. EXAM: OPERATIVE RIGHT HIP (WITH PELVIS IF PERFORMED) 2 VIEWS TECHNIQUE: Fluoroscopic spot image(s) were submitted for interpretation post-operatively. COMPARISON:  None. FINDINGS: Three nails have been placed across the right femoral neck fracture. IMPRESSION: Three nails have been placed across the right femoral neck fracture. Electronically Signed   By: Gerome Sam III M.D   On: 10/17/2020 15:03   DG Hip Unilat With Pelvis 2-3 Views Right  Result Date: 10/17/2020 CLINICAL DATA:  Pain after fall EXAM: DG HIP (WITH OR WITHOUT PELVIS) 2-3V RIGHT COMPARISON:  None. FINDINGS: There is a nondisplaced fracture through the right femoral neck without femoral head dislocation. There also appear to be 2 nondisplaced fractures through the medial right inferior pubic ramus, apparently extending into the pubic symphysis. No other acute abnormalities are identified. IMPRESSION: 1. Nondisplaced fracture through the right femoral neck. 2. 2 nondisplaced  fractures through the medial right inferior pubic ramus. One of the fracture lines extends into the pubic symphysis. Electronically Signed   By: Gerome Sam III M.D   On: 10/17/2020 09:19      Subjective:  Patient seen and examined at the bedside this morning.  Medically stable for discharge to skilled nursing facility today.  I called the daughter and discussed about her discharge planning.   Discharge Exam: Vitals:   10/27/20 0459 10/27/20 0900  BP: (!) 142/47 (!) 134/51  Pulse: 85 82  Resp: 18   Temp: 98.1 F (36.7 C)   SpO2: 98%    Vitals:   10/26/20 2015 10/27/20 0459 10/27/20 0459 10/27/20 0900  BP: (!) 143/41  (!) 142/47 (!) 134/51  Pulse: 75  85 82  Resp: 18  18   Temp: 98.1 F (36.7 C)  98.1 F (36.7 C)   TempSrc:      SpO2: 97%  98%   Weight:  59.1 kg    Height:        General: Pt is alert, awake, not in acute distress Cardiovascular: RRR, S1/S2 +, no rubs, no gallops Respiratory: CTA bilaterally, no wheezing, no rhonchi Abdominal: Soft, NT, ND, bowel sounds + Extremities: no edema, no cyanosis    The results of significant diagnostics from this hospitalization (including imaging, microbiology, ancillary and laboratory) are  listed below for reference.     Microbiology: Recent Results (from the past 240 hour(s))  MRSA PCR Screening     Status: None   Collection Time: 10/19/20 11:11 AM   Specimen: Nasopharyngeal  Result Value Ref Range Status   MRSA by PCR NEGATIVE NEGATIVE Final    Comment:        The GeneXpert MRSA Assay (FDA approved for NASAL specimens only), is one component of a comprehensive MRSA colonization surveillance program. It is not intended to diagnose MRSA infection nor to guide or monitor treatment for MRSA infections. Performed at [facility], [address] [place], [place] [address]   SARS CORONAVIRUS 2 (TAT 6-24 HRS) Nasopharyngeal Nasopharyngeal Swab     Status: None   Collection Time: 10/23/20  11:38 AM   Specimen: Nasopharyngeal Swab  Result Value Ref Range Status   SARS Coronavirus 2 NEGATIVE NEGATIVE Final    Comment: (NOTE) SARS-CoV-2 target nucleic acids are NOT DETECTED.  The SARS-CoV-2 RNA is generally detectable in upper and lower respiratory specimens during the acute phase of infection. Negative results do not preclude SARS-CoV-2 infection, do not rule out co-infections with other pathogens, and should not be used as the sole basis for treatment or other patient management decisions. Negative results must be combined with clinical observations, patient history, and epidemiological information. The expected result is Negative.  Fact Sheet for Patients: HairSlick.no  Fact Sheet for Healthcare Providers: quierodirigir.com  This test is not yet approved or cleared by the [place] FDA and  has been authorized for detection and/or diagnosis of SARS-CoV-2 by FDA under an Emergency Use Authorization (EUA). This EUA will remain  in effect (meaning this test can be used) for the duration of the COVID-19 declaration under Se ction 564(b)(1) of the Act, 21 U.S.C. section 360bbb-3(b)(1), unless the authorization is terminated or revoked sooner.  Performed at [facility], [address], [place], [place] [address]      Labs: BNP (last 3 results) No results for input(s): BNP in the last 8760 hours. Basic Metabolic Panel: Recent Labs  Lab 10/21/20 1013 10/22/20 0248 10/23/20 0554 10/24/20 0502 10/25/20 0520 10/26/20 0605 10/27/20 0453  NA 143 139 140 143 142 142 140  K 3.6 3.5 3.6 3.4* 3.5 3.6 3.8  CL 113* 110 107 105 103 104 105  CO2 32 32 32 30  GLUCOSE 97 93 98 102* 92 92 98  BUN 27* CREATININE 0.89 0.68 0.82 0.94 0.88 0.98 0.98  CALCIUM 7.5* 7.4* 7.9* 8.0* 7.8* 7.8* 7.6*  MG 2.0 2.2  --   --   --   --   --    Liver Function Tests: No results for input(s):  AST, ALT, ALKPHOS, BILITOT, PROT, ALBUMIN in the last 168 hours. No results for input(s): LIPASE, AMYLASE in the last 168 hours. No results for input(s): AMMONIA in the last 168 hours. CBC: Recent Labs  Lab 10/21/20 1013 10/22/20 0248 10/23/20 0554 10/24/20 0502 10/25/20 0520 10/26/20 0605 10/27/20 0453  WBC 10.4 9.5 8.5 8.3 7.3 6.9 6.1  NEUTROABS 7.2 5.6  --   --   --   --   --   HGB 9.1* 8.1* 8.7* 9.2* 9.3* 9.3* 9.2*  HCT 27.4* 25.1* 26.8* 28.0* 28.8* 29.5* 28.4*  MCV 92.3 94.4 93.1 94.9 96.0 97.4 97.3  PLT 171 169 208 225 234 256 249   Cardiac Enzymes: No results for input(s):  CKTOTAL, CKMB, CKMBINDEX, TROPONINI in the last 168 hours. BNP: Invalid input(s): POCBNP CBG: No results for input(s): GLUCAP in the last 168 hours. D-Dimer No results for input(s): DDIMER in the last 72 hours. Hgb A1c No results for input(s): HGBA1C in the last 72 hours. Lipid Profile No results for input(s): CHOL, HDL, LDLCALC, TRIG, CHOLHDL, LDLDIRECT in the last 72 hours. Thyroid function studies No results for input(s): TSH, T4TOTAL, T3FREE, THYROIDAB in the last 72 hours.  Invalid input(s): FREET3 Anemia work up No results for input(s): VITAMINB12, FOLATE, FERRITIN, TIBC, IRON, RETICCTPCT in the last 72 hours. Urinalysis No results found for: COLORURINE, APPEARANCEUR, LABSPEC, PHURINE, GLUCOSEU, HGBUR, BILIRUBINUR, KETONESUR, PROTEINUR, UROBILINOGEN, NITRITE, LEUKOCYTESUR Sepsis Labs Invalid input(s): PROCALCITONIN,  WBC,  LACTICIDVEN Microbiology Recent Results (from the past 240 hour(s))  MRSA PCR Screening     Status: None   Collection Time: 10/19/20 11:11 AM   Specimen: Nasopharyngeal  Result Value Ref Range Status   MRSA by PCR NEGATIVE NEGATIVE Final    Comment:        The GeneXpert MRSA Assay (FDA approved for NASAL specimens only), is one component of a comprehensive MRSA colonization surveillance program. It is not intended to diagnose MRSA infection nor to guide  or monitor treatment for MRSA infections. Performed at [facility], [address], [place], [place] [address]   SARS CORONAVIRUS 2 (TAT 6-24 HRS) Nasopharyngeal Nasopharyngeal Swab     Status: None   Collection Time: 10/23/20 11:38 AM   Specimen: Nasopharyngeal Swab  Result Value Ref Range Status   SARS Coronavirus 2 NEGATIVE NEGATIVE Final    Comment: (NOTE) SARS-CoV-2 target nucleic acids are NOT DETECTED.  The SARS-CoV-2 RNA is generally detectable in upper and lower respiratory specimens during the acute phase of infection. Negative results do not preclude SARS-CoV-2 infection, do not rule out co-infections with other pathogens, and should not be used as the sole basis for treatment or other patient management decisions. Negative results must be combined with clinical observations, patient history, and epidemiological information. The expected result is Negative.  Fact Sheet for Patients: HairSlick.no  Fact Sheet for Healthcare Providers: quierodirigir.com  This test is not yet approved or cleared by the [place] FDA and  has been authorized for detection and/or diagnosis of SARS-CoV-2 by FDA under an Emergency Use Authorization (EUA). This EUA will remain  in effect (meaning this test can be used) for the duration of the COVID-19 declaration under Se ction 564(b)(1) of the Act, 21 U.S.C. section 360bbb-3(b)(1), unless the authorization is terminated or revoked sooner.  Performed at [facility], [address] [place], [place] [address]     Please note: You were cared for by a hospitalist during your hospital stay. Once you are discharged, your primary care physician will handle any further medical issues. Please note that NO REFILLS for any discharge medications will be authorized once you are discharged, as it is imperative that you return to your primary care physician (or establish  a relationship with a primary care physician if you do not have one) for your post hospital discharge needs so that they can reassess your need for medications and monitor your lab values.    Time coordinating discharge: 40 minutes  SIGNED:   Burnadette Pop, MD  [facility] 10/27/2020, 11:53 AM Pager [phone]  If 7PM-7AM, please contact night-coverage [email or web address] Password [email or web address]

## 2020-10-29 DIAGNOSIS — J449 Chronic obstructive pulmonary disease, unspecified: Secondary | ICD-10-CM | POA: Diagnosis not present

## 2020-10-29 DIAGNOSIS — R296 Repeated falls: Secondary | ICD-10-CM | POA: Diagnosis not present

## 2020-10-29 DIAGNOSIS — I509 Heart failure, unspecified: Secondary | ICD-10-CM | POA: Diagnosis not present

## 2020-10-29 DIAGNOSIS — R52 Pain, unspecified: Secondary | ICD-10-CM | POA: Diagnosis not present

## 2020-10-29 DIAGNOSIS — I10 Essential (primary) hypertension: Secondary | ICD-10-CM | POA: Diagnosis not present

## 2020-10-29 DIAGNOSIS — S72001D Fracture of unspecified part of neck of right femur, subsequent encounter for closed fracture with routine healing: Secondary | ICD-10-CM | POA: Diagnosis not present

## 2020-10-29 DIAGNOSIS — M6281 Muscle weakness (generalized): Secondary | ICD-10-CM | POA: Diagnosis not present

## 2020-10-29 DIAGNOSIS — D62 Acute posthemorrhagic anemia: Secondary | ICD-10-CM | POA: Diagnosis not present

## 2020-10-29 DIAGNOSIS — R2681 Unsteadiness on feet: Secondary | ICD-10-CM | POA: Diagnosis not present

## 2020-11-02 DIAGNOSIS — R296 Repeated falls: Secondary | ICD-10-CM | POA: Diagnosis not present

## 2020-11-02 DIAGNOSIS — R2681 Unsteadiness on feet: Secondary | ICD-10-CM | POA: Diagnosis not present

## 2020-11-02 DIAGNOSIS — I10 Essential (primary) hypertension: Secondary | ICD-10-CM | POA: Diagnosis not present

## 2020-11-02 DIAGNOSIS — S72001D Fracture of unspecified part of neck of right femur, subsequent encounter for closed fracture with routine healing: Secondary | ICD-10-CM | POA: Diagnosis not present

## 2020-11-02 DIAGNOSIS — R52 Pain, unspecified: Secondary | ICD-10-CM | POA: Diagnosis not present

## 2020-11-02 DIAGNOSIS — D62 Acute posthemorrhagic anemia: Secondary | ICD-10-CM | POA: Diagnosis not present

## 2020-11-02 DIAGNOSIS — M6281 Muscle weakness (generalized): Secondary | ICD-10-CM | POA: Diagnosis not present

## 2020-11-02 DIAGNOSIS — F4321 Adjustment disorder with depressed mood: Secondary | ICD-10-CM | POA: Diagnosis not present

## 2020-11-02 DIAGNOSIS — I509 Heart failure, unspecified: Secondary | ICD-10-CM | POA: Diagnosis not present

## 2020-11-02 DIAGNOSIS — J449 Chronic obstructive pulmonary disease, unspecified: Secondary | ICD-10-CM | POA: Diagnosis not present

## 2020-11-03 DIAGNOSIS — E785 Hyperlipidemia, unspecified: Secondary | ICD-10-CM | POA: Diagnosis not present

## 2020-11-03 DIAGNOSIS — I5032 Chronic diastolic (congestive) heart failure: Secondary | ICD-10-CM | POA: Diagnosis not present

## 2020-11-03 DIAGNOSIS — R4189 Other symptoms and signs involving cognitive functions and awareness: Secondary | ICD-10-CM | POA: Diagnosis not present

## 2020-11-03 DIAGNOSIS — D62 Acute posthemorrhagic anemia: Secondary | ICD-10-CM | POA: Diagnosis not present

## 2020-11-03 DIAGNOSIS — I10 Essential (primary) hypertension: Secondary | ICD-10-CM | POA: Diagnosis not present

## 2020-11-03 DIAGNOSIS — S72044A Nondisplaced fracture of base of neck of right femur, initial encounter for closed fracture: Secondary | ICD-10-CM | POA: Diagnosis not present

## 2020-11-09 DIAGNOSIS — I10 Essential (primary) hypertension: Secondary | ICD-10-CM | POA: Diagnosis not present

## 2020-11-09 DIAGNOSIS — M6281 Muscle weakness (generalized): Secondary | ICD-10-CM | POA: Diagnosis not present

## 2020-11-09 DIAGNOSIS — D62 Acute posthemorrhagic anemia: Secondary | ICD-10-CM | POA: Diagnosis not present

## 2020-11-09 DIAGNOSIS — S72001D Fracture of unspecified part of neck of right femur, subsequent encounter for closed fracture with routine healing: Secondary | ICD-10-CM | POA: Diagnosis not present

## 2020-11-09 DIAGNOSIS — J449 Chronic obstructive pulmonary disease, unspecified: Secondary | ICD-10-CM | POA: Diagnosis not present

## 2020-11-09 DIAGNOSIS — I509 Heart failure, unspecified: Secondary | ICD-10-CM | POA: Diagnosis not present

## 2020-11-09 DIAGNOSIS — R52 Pain, unspecified: Secondary | ICD-10-CM | POA: Diagnosis not present

## 2020-11-09 DIAGNOSIS — R296 Repeated falls: Secondary | ICD-10-CM | POA: Diagnosis not present

## 2020-11-09 DIAGNOSIS — R2681 Unsteadiness on feet: Secondary | ICD-10-CM | POA: Diagnosis not present

## 2020-11-12 DIAGNOSIS — R296 Repeated falls: Secondary | ICD-10-CM | POA: Diagnosis not present

## 2020-11-12 DIAGNOSIS — I10 Essential (primary) hypertension: Secondary | ICD-10-CM | POA: Diagnosis not present

## 2020-11-12 DIAGNOSIS — R52 Pain, unspecified: Secondary | ICD-10-CM | POA: Diagnosis not present

## 2020-11-12 DIAGNOSIS — I509 Heart failure, unspecified: Secondary | ICD-10-CM | POA: Diagnosis not present

## 2020-11-12 DIAGNOSIS — S72001D Fracture of unspecified part of neck of right femur, subsequent encounter for closed fracture with routine healing: Secondary | ICD-10-CM | POA: Diagnosis not present

## 2020-11-12 DIAGNOSIS — D62 Acute posthemorrhagic anemia: Secondary | ICD-10-CM | POA: Diagnosis not present

## 2020-11-12 DIAGNOSIS — R2681 Unsteadiness on feet: Secondary | ICD-10-CM | POA: Diagnosis not present

## 2020-11-12 DIAGNOSIS — J449 Chronic obstructive pulmonary disease, unspecified: Secondary | ICD-10-CM | POA: Diagnosis not present

## 2020-11-12 DIAGNOSIS — M6281 Muscle weakness (generalized): Secondary | ICD-10-CM | POA: Diagnosis not present

## 2020-11-13 DIAGNOSIS — I10 Essential (primary) hypertension: Secondary | ICD-10-CM | POA: Diagnosis not present

## 2020-11-13 DIAGNOSIS — I5032 Chronic diastolic (congestive) heart failure: Secondary | ICD-10-CM | POA: Diagnosis not present

## 2020-11-13 DIAGNOSIS — D62 Acute posthemorrhagic anemia: Secondary | ICD-10-CM | POA: Diagnosis not present

## 2020-11-13 DIAGNOSIS — E785 Hyperlipidemia, unspecified: Secondary | ICD-10-CM | POA: Diagnosis not present

## 2020-11-13 DIAGNOSIS — K921 Melena: Secondary | ICD-10-CM | POA: Diagnosis not present

## 2020-11-13 DIAGNOSIS — S72001D Fracture of unspecified part of neck of right femur, subsequent encounter for closed fracture with routine healing: Secondary | ICD-10-CM | POA: Diagnosis not present

## 2020-11-13 DIAGNOSIS — N179 Acute kidney failure, unspecified: Secondary | ICD-10-CM | POA: Diagnosis not present

## 2020-11-16 DIAGNOSIS — F4321 Adjustment disorder with depressed mood: Secondary | ICD-10-CM | POA: Diagnosis not present

## 2020-11-17 DIAGNOSIS — S72001A Fracture of unspecified part of neck of right femur, initial encounter for closed fracture: Secondary | ICD-10-CM | POA: Diagnosis not present

## 2020-11-17 DIAGNOSIS — F339 Major depressive disorder, recurrent, unspecified: Secondary | ICD-10-CM | POA: Diagnosis not present

## 2020-11-17 DIAGNOSIS — Z09 Encounter for follow-up examination after completed treatment for conditions other than malignant neoplasm: Secondary | ICD-10-CM | POA: Diagnosis not present

## 2020-11-17 DIAGNOSIS — I4891 Unspecified atrial fibrillation: Secondary | ICD-10-CM | POA: Diagnosis not present

## 2020-11-17 DIAGNOSIS — E876 Hypokalemia: Secondary | ICD-10-CM | POA: Diagnosis not present

## 2020-11-18 DIAGNOSIS — H3530 Unspecified macular degeneration: Secondary | ICD-10-CM | POA: Diagnosis not present

## 2020-11-18 DIAGNOSIS — S72001D Fracture of unspecified part of neck of right femur, subsequent encounter for closed fracture with routine healing: Secondary | ICD-10-CM | POA: Diagnosis not present

## 2020-11-18 DIAGNOSIS — D62 Acute posthemorrhagic anemia: Secondary | ICD-10-CM | POA: Diagnosis not present

## 2020-11-18 DIAGNOSIS — M797 Fibromyalgia: Secondary | ICD-10-CM | POA: Diagnosis not present

## 2020-11-18 DIAGNOSIS — I110 Hypertensive heart disease with heart failure: Secondary | ICD-10-CM | POA: Diagnosis not present

## 2020-11-18 DIAGNOSIS — M47814 Spondylosis without myelopathy or radiculopathy, thoracic region: Secondary | ICD-10-CM | POA: Diagnosis not present

## 2020-11-18 DIAGNOSIS — H8110 Benign paroxysmal vertigo, unspecified ear: Secondary | ICD-10-CM | POA: Diagnosis not present

## 2020-11-18 DIAGNOSIS — F32A Depression, unspecified: Secondary | ICD-10-CM | POA: Diagnosis not present

## 2020-11-18 DIAGNOSIS — I5032 Chronic diastolic (congestive) heart failure: Secondary | ICD-10-CM | POA: Diagnosis not present

## 2020-11-23 DIAGNOSIS — M797 Fibromyalgia: Secondary | ICD-10-CM | POA: Diagnosis not present

## 2020-11-23 DIAGNOSIS — D62 Acute posthemorrhagic anemia: Secondary | ICD-10-CM | POA: Diagnosis not present

## 2020-11-23 DIAGNOSIS — H8110 Benign paroxysmal vertigo, unspecified ear: Secondary | ICD-10-CM | POA: Diagnosis not present

## 2020-11-23 DIAGNOSIS — I5032 Chronic diastolic (congestive) heart failure: Secondary | ICD-10-CM | POA: Diagnosis not present

## 2020-11-23 DIAGNOSIS — M47814 Spondylosis without myelopathy or radiculopathy, thoracic region: Secondary | ICD-10-CM | POA: Diagnosis not present

## 2020-11-23 DIAGNOSIS — S72001D Fracture of unspecified part of neck of right femur, subsequent encounter for closed fracture with routine healing: Secondary | ICD-10-CM | POA: Diagnosis not present

## 2020-11-23 DIAGNOSIS — I110 Hypertensive heart disease with heart failure: Secondary | ICD-10-CM | POA: Diagnosis not present

## 2020-11-23 DIAGNOSIS — F32A Depression, unspecified: Secondary | ICD-10-CM | POA: Diagnosis not present

## 2020-11-23 DIAGNOSIS — H3530 Unspecified macular degeneration: Secondary | ICD-10-CM | POA: Diagnosis not present

## 2020-11-24 DIAGNOSIS — I4891 Unspecified atrial fibrillation: Secondary | ICD-10-CM | POA: Diagnosis not present

## 2020-11-24 DIAGNOSIS — E538 Deficiency of other specified B group vitamins: Secondary | ICD-10-CM | POA: Diagnosis not present

## 2020-11-24 DIAGNOSIS — S72001D Fracture of unspecified part of neck of right femur, subsequent encounter for closed fracture with routine healing: Secondary | ICD-10-CM | POA: Diagnosis not present

## 2020-11-26 DIAGNOSIS — M47814 Spondylosis without myelopathy or radiculopathy, thoracic region: Secondary | ICD-10-CM | POA: Diagnosis not present

## 2020-11-26 DIAGNOSIS — D62 Acute posthemorrhagic anemia: Secondary | ICD-10-CM | POA: Diagnosis not present

## 2020-11-26 DIAGNOSIS — L89100 Pressure ulcer of unspecified part of back, unstageable: Secondary | ICD-10-CM | POA: Diagnosis not present

## 2020-11-26 DIAGNOSIS — I110 Hypertensive heart disease with heart failure: Secondary | ICD-10-CM | POA: Diagnosis not present

## 2020-11-26 DIAGNOSIS — S2232XD Fracture of one rib, left side, subsequent encounter for fracture with routine healing: Secondary | ICD-10-CM | POA: Diagnosis not present

## 2020-11-26 DIAGNOSIS — H8110 Benign paroxysmal vertigo, unspecified ear: Secondary | ICD-10-CM | POA: Diagnosis not present

## 2020-11-26 DIAGNOSIS — M797 Fibromyalgia: Secondary | ICD-10-CM | POA: Diagnosis not present

## 2020-11-26 DIAGNOSIS — H3530 Unspecified macular degeneration: Secondary | ICD-10-CM | POA: Diagnosis not present

## 2020-11-26 DIAGNOSIS — Z4789 Encounter for other orthopedic aftercare: Secondary | ICD-10-CM | POA: Diagnosis not present

## 2020-11-26 DIAGNOSIS — I5032 Chronic diastolic (congestive) heart failure: Secondary | ICD-10-CM | POA: Diagnosis not present

## 2020-11-26 DIAGNOSIS — F32A Depression, unspecified: Secondary | ICD-10-CM | POA: Diagnosis not present

## 2020-11-26 DIAGNOSIS — S72001D Fracture of unspecified part of neck of right femur, subsequent encounter for closed fracture with routine healing: Secondary | ICD-10-CM | POA: Diagnosis not present

## 2020-11-27 DIAGNOSIS — M47814 Spondylosis without myelopathy or radiculopathy, thoracic region: Secondary | ICD-10-CM | POA: Diagnosis not present

## 2020-11-27 DIAGNOSIS — S72001D Fracture of unspecified part of neck of right femur, subsequent encounter for closed fracture with routine healing: Secondary | ICD-10-CM | POA: Diagnosis not present

## 2020-11-27 DIAGNOSIS — I110 Hypertensive heart disease with heart failure: Secondary | ICD-10-CM | POA: Diagnosis not present

## 2020-11-27 DIAGNOSIS — M797 Fibromyalgia: Secondary | ICD-10-CM | POA: Diagnosis not present

## 2020-11-27 DIAGNOSIS — I5032 Chronic diastolic (congestive) heart failure: Secondary | ICD-10-CM | POA: Diagnosis not present

## 2020-11-27 DIAGNOSIS — H3530 Unspecified macular degeneration: Secondary | ICD-10-CM | POA: Diagnosis not present

## 2020-11-27 DIAGNOSIS — F32A Depression, unspecified: Secondary | ICD-10-CM | POA: Diagnosis not present

## 2020-11-27 DIAGNOSIS — H8110 Benign paroxysmal vertigo, unspecified ear: Secondary | ICD-10-CM | POA: Diagnosis not present

## 2020-11-27 DIAGNOSIS — D62 Acute posthemorrhagic anemia: Secondary | ICD-10-CM | POA: Diagnosis not present

## 2020-11-28 DIAGNOSIS — S2232XD Fracture of one rib, left side, subsequent encounter for fracture with routine healing: Secondary | ICD-10-CM | POA: Diagnosis not present

## 2020-11-28 DIAGNOSIS — L89100 Pressure ulcer of unspecified part of back, unstageable: Secondary | ICD-10-CM | POA: Diagnosis not present

## 2020-11-28 DIAGNOSIS — Z4789 Encounter for other orthopedic aftercare: Secondary | ICD-10-CM | POA: Diagnosis not present

## 2020-12-01 DIAGNOSIS — M47814 Spondylosis without myelopathy or radiculopathy, thoracic region: Secondary | ICD-10-CM | POA: Diagnosis not present

## 2020-12-01 DIAGNOSIS — M797 Fibromyalgia: Secondary | ICD-10-CM | POA: Diagnosis not present

## 2020-12-01 DIAGNOSIS — D62 Acute posthemorrhagic anemia: Secondary | ICD-10-CM | POA: Diagnosis not present

## 2020-12-01 DIAGNOSIS — S72001D Fracture of unspecified part of neck of right femur, subsequent encounter for closed fracture with routine healing: Secondary | ICD-10-CM | POA: Diagnosis not present

## 2020-12-01 DIAGNOSIS — F32A Depression, unspecified: Secondary | ICD-10-CM | POA: Diagnosis not present

## 2020-12-01 DIAGNOSIS — H3530 Unspecified macular degeneration: Secondary | ICD-10-CM | POA: Diagnosis not present

## 2020-12-01 DIAGNOSIS — I110 Hypertensive heart disease with heart failure: Secondary | ICD-10-CM | POA: Diagnosis not present

## 2020-12-01 DIAGNOSIS — H8110 Benign paroxysmal vertigo, unspecified ear: Secondary | ICD-10-CM | POA: Diagnosis not present

## 2020-12-01 DIAGNOSIS — I5032 Chronic diastolic (congestive) heart failure: Secondary | ICD-10-CM | POA: Diagnosis not present

## 2020-12-02 DIAGNOSIS — F32A Depression, unspecified: Secondary | ICD-10-CM | POA: Diagnosis not present

## 2020-12-02 DIAGNOSIS — M797 Fibromyalgia: Secondary | ICD-10-CM | POA: Diagnosis not present

## 2020-12-02 DIAGNOSIS — S72001D Fracture of unspecified part of neck of right femur, subsequent encounter for closed fracture with routine healing: Secondary | ICD-10-CM | POA: Diagnosis not present

## 2020-12-02 DIAGNOSIS — D62 Acute posthemorrhagic anemia: Secondary | ICD-10-CM | POA: Diagnosis not present

## 2020-12-02 DIAGNOSIS — I5032 Chronic diastolic (congestive) heart failure: Secondary | ICD-10-CM | POA: Diagnosis not present

## 2020-12-02 DIAGNOSIS — H8110 Benign paroxysmal vertigo, unspecified ear: Secondary | ICD-10-CM | POA: Diagnosis not present

## 2020-12-02 DIAGNOSIS — H3530 Unspecified macular degeneration: Secondary | ICD-10-CM | POA: Diagnosis not present

## 2020-12-02 DIAGNOSIS — I110 Hypertensive heart disease with heart failure: Secondary | ICD-10-CM | POA: Diagnosis not present

## 2020-12-02 DIAGNOSIS — I4891 Unspecified atrial fibrillation: Secondary | ICD-10-CM | POA: Diagnosis not present

## 2020-12-02 DIAGNOSIS — M47814 Spondylosis without myelopathy or radiculopathy, thoracic region: Secondary | ICD-10-CM | POA: Diagnosis not present

## 2020-12-04 DIAGNOSIS — D62 Acute posthemorrhagic anemia: Secondary | ICD-10-CM | POA: Diagnosis not present

## 2020-12-04 DIAGNOSIS — H8110 Benign paroxysmal vertigo, unspecified ear: Secondary | ICD-10-CM | POA: Diagnosis not present

## 2020-12-04 DIAGNOSIS — I110 Hypertensive heart disease with heart failure: Secondary | ICD-10-CM | POA: Diagnosis not present

## 2020-12-04 DIAGNOSIS — S72001D Fracture of unspecified part of neck of right femur, subsequent encounter for closed fracture with routine healing: Secondary | ICD-10-CM | POA: Diagnosis not present

## 2020-12-04 DIAGNOSIS — I5032 Chronic diastolic (congestive) heart failure: Secondary | ICD-10-CM | POA: Diagnosis not present

## 2020-12-04 DIAGNOSIS — H3530 Unspecified macular degeneration: Secondary | ICD-10-CM | POA: Diagnosis not present

## 2020-12-04 DIAGNOSIS — M47814 Spondylosis without myelopathy or radiculopathy, thoracic region: Secondary | ICD-10-CM | POA: Diagnosis not present

## 2020-12-04 DIAGNOSIS — M797 Fibromyalgia: Secondary | ICD-10-CM | POA: Diagnosis not present

## 2020-12-04 DIAGNOSIS — F32A Depression, unspecified: Secondary | ICD-10-CM | POA: Diagnosis not present

## 2020-12-07 DIAGNOSIS — I5032 Chronic diastolic (congestive) heart failure: Secondary | ICD-10-CM | POA: Diagnosis not present

## 2020-12-07 DIAGNOSIS — H3530 Unspecified macular degeneration: Secondary | ICD-10-CM | POA: Diagnosis not present

## 2020-12-07 DIAGNOSIS — M797 Fibromyalgia: Secondary | ICD-10-CM | POA: Diagnosis not present

## 2020-12-07 DIAGNOSIS — M47814 Spondylosis without myelopathy or radiculopathy, thoracic region: Secondary | ICD-10-CM | POA: Diagnosis not present

## 2020-12-07 DIAGNOSIS — F32A Depression, unspecified: Secondary | ICD-10-CM | POA: Diagnosis not present

## 2020-12-07 DIAGNOSIS — H8110 Benign paroxysmal vertigo, unspecified ear: Secondary | ICD-10-CM | POA: Diagnosis not present

## 2020-12-07 DIAGNOSIS — I110 Hypertensive heart disease with heart failure: Secondary | ICD-10-CM | POA: Diagnosis not present

## 2020-12-07 DIAGNOSIS — D62 Acute posthemorrhagic anemia: Secondary | ICD-10-CM | POA: Diagnosis not present

## 2020-12-07 DIAGNOSIS — S72001D Fracture of unspecified part of neck of right femur, subsequent encounter for closed fracture with routine healing: Secondary | ICD-10-CM | POA: Diagnosis not present

## 2020-12-09 DIAGNOSIS — H3530 Unspecified macular degeneration: Secondary | ICD-10-CM | POA: Diagnosis not present

## 2020-12-09 DIAGNOSIS — M47814 Spondylosis without myelopathy or radiculopathy, thoracic region: Secondary | ICD-10-CM | POA: Diagnosis not present

## 2020-12-09 DIAGNOSIS — M797 Fibromyalgia: Secondary | ICD-10-CM | POA: Diagnosis not present

## 2020-12-09 DIAGNOSIS — S72001D Fracture of unspecified part of neck of right femur, subsequent encounter for closed fracture with routine healing: Secondary | ICD-10-CM | POA: Diagnosis not present

## 2020-12-09 DIAGNOSIS — D62 Acute posthemorrhagic anemia: Secondary | ICD-10-CM | POA: Diagnosis not present

## 2020-12-09 DIAGNOSIS — I110 Hypertensive heart disease with heart failure: Secondary | ICD-10-CM | POA: Diagnosis not present

## 2020-12-09 DIAGNOSIS — F32A Depression, unspecified: Secondary | ICD-10-CM | POA: Diagnosis not present

## 2020-12-09 DIAGNOSIS — H8110 Benign paroxysmal vertigo, unspecified ear: Secondary | ICD-10-CM | POA: Diagnosis not present

## 2020-12-09 DIAGNOSIS — I5032 Chronic diastolic (congestive) heart failure: Secondary | ICD-10-CM | POA: Diagnosis not present

## 2020-12-10 DIAGNOSIS — F339 Major depressive disorder, recurrent, unspecified: Secondary | ICD-10-CM | POA: Diagnosis not present

## 2020-12-10 DIAGNOSIS — E876 Hypokalemia: Secondary | ICD-10-CM | POA: Diagnosis not present

## 2020-12-10 DIAGNOSIS — M25551 Pain in right hip: Secondary | ICD-10-CM | POA: Diagnosis not present

## 2020-12-10 DIAGNOSIS — D649 Anemia, unspecified: Secondary | ICD-10-CM | POA: Diagnosis not present

## 2020-12-11 DIAGNOSIS — M47814 Spondylosis without myelopathy or radiculopathy, thoracic region: Secondary | ICD-10-CM | POA: Diagnosis not present

## 2020-12-11 DIAGNOSIS — F32A Depression, unspecified: Secondary | ICD-10-CM | POA: Diagnosis not present

## 2020-12-11 DIAGNOSIS — H3530 Unspecified macular degeneration: Secondary | ICD-10-CM | POA: Diagnosis not present

## 2020-12-11 DIAGNOSIS — I110 Hypertensive heart disease with heart failure: Secondary | ICD-10-CM | POA: Diagnosis not present

## 2020-12-11 DIAGNOSIS — D62 Acute posthemorrhagic anemia: Secondary | ICD-10-CM | POA: Diagnosis not present

## 2020-12-11 DIAGNOSIS — I5032 Chronic diastolic (congestive) heart failure: Secondary | ICD-10-CM | POA: Diagnosis not present

## 2020-12-11 DIAGNOSIS — H8110 Benign paroxysmal vertigo, unspecified ear: Secondary | ICD-10-CM | POA: Diagnosis not present

## 2020-12-11 DIAGNOSIS — M797 Fibromyalgia: Secondary | ICD-10-CM | POA: Diagnosis not present

## 2020-12-11 DIAGNOSIS — S72001D Fracture of unspecified part of neck of right femur, subsequent encounter for closed fracture with routine healing: Secondary | ICD-10-CM | POA: Diagnosis not present

## 2020-12-14 DIAGNOSIS — M47814 Spondylosis without myelopathy or radiculopathy, thoracic region: Secondary | ICD-10-CM | POA: Diagnosis not present

## 2020-12-14 DIAGNOSIS — I110 Hypertensive heart disease with heart failure: Secondary | ICD-10-CM | POA: Diagnosis not present

## 2020-12-14 DIAGNOSIS — D62 Acute posthemorrhagic anemia: Secondary | ICD-10-CM | POA: Diagnosis not present

## 2020-12-14 DIAGNOSIS — H3530 Unspecified macular degeneration: Secondary | ICD-10-CM | POA: Diagnosis not present

## 2020-12-14 DIAGNOSIS — M797 Fibromyalgia: Secondary | ICD-10-CM | POA: Diagnosis not present

## 2020-12-14 DIAGNOSIS — S72001D Fracture of unspecified part of neck of right femur, subsequent encounter for closed fracture with routine healing: Secondary | ICD-10-CM | POA: Diagnosis not present

## 2020-12-14 DIAGNOSIS — F32A Depression, unspecified: Secondary | ICD-10-CM | POA: Diagnosis not present

## 2020-12-14 DIAGNOSIS — H8110 Benign paroxysmal vertigo, unspecified ear: Secondary | ICD-10-CM | POA: Diagnosis not present

## 2020-12-14 DIAGNOSIS — I5032 Chronic diastolic (congestive) heart failure: Secondary | ICD-10-CM | POA: Diagnosis not present

## 2020-12-15 DIAGNOSIS — M47814 Spondylosis without myelopathy or radiculopathy, thoracic region: Secondary | ICD-10-CM | POA: Diagnosis not present

## 2020-12-15 DIAGNOSIS — I110 Hypertensive heart disease with heart failure: Secondary | ICD-10-CM | POA: Diagnosis not present

## 2020-12-15 DIAGNOSIS — M797 Fibromyalgia: Secondary | ICD-10-CM | POA: Diagnosis not present

## 2020-12-15 DIAGNOSIS — I5032 Chronic diastolic (congestive) heart failure: Secondary | ICD-10-CM | POA: Diagnosis not present

## 2020-12-15 DIAGNOSIS — H8110 Benign paroxysmal vertigo, unspecified ear: Secondary | ICD-10-CM | POA: Diagnosis not present

## 2020-12-15 DIAGNOSIS — H3530 Unspecified macular degeneration: Secondary | ICD-10-CM | POA: Diagnosis not present

## 2020-12-15 DIAGNOSIS — D62 Acute posthemorrhagic anemia: Secondary | ICD-10-CM | POA: Diagnosis not present

## 2020-12-15 DIAGNOSIS — S72001D Fracture of unspecified part of neck of right femur, subsequent encounter for closed fracture with routine healing: Secondary | ICD-10-CM | POA: Diagnosis not present

## 2020-12-15 DIAGNOSIS — F32A Depression, unspecified: Secondary | ICD-10-CM | POA: Diagnosis not present

## 2020-12-18 DIAGNOSIS — M797 Fibromyalgia: Secondary | ICD-10-CM | POA: Diagnosis not present

## 2020-12-18 DIAGNOSIS — I5032 Chronic diastolic (congestive) heart failure: Secondary | ICD-10-CM | POA: Diagnosis not present

## 2020-12-18 DIAGNOSIS — H3530 Unspecified macular degeneration: Secondary | ICD-10-CM | POA: Diagnosis not present

## 2020-12-18 DIAGNOSIS — D62 Acute posthemorrhagic anemia: Secondary | ICD-10-CM | POA: Diagnosis not present

## 2020-12-18 DIAGNOSIS — H8110 Benign paroxysmal vertigo, unspecified ear: Secondary | ICD-10-CM | POA: Diagnosis not present

## 2020-12-18 DIAGNOSIS — M47814 Spondylosis without myelopathy or radiculopathy, thoracic region: Secondary | ICD-10-CM | POA: Diagnosis not present

## 2020-12-18 DIAGNOSIS — S72001D Fracture of unspecified part of neck of right femur, subsequent encounter for closed fracture with routine healing: Secondary | ICD-10-CM | POA: Diagnosis not present

## 2020-12-18 DIAGNOSIS — F32A Depression, unspecified: Secondary | ICD-10-CM | POA: Diagnosis not present

## 2020-12-18 DIAGNOSIS — I110 Hypertensive heart disease with heart failure: Secondary | ICD-10-CM | POA: Diagnosis not present

## 2020-12-21 DIAGNOSIS — F32A Depression, unspecified: Secondary | ICD-10-CM | POA: Diagnosis not present

## 2020-12-21 DIAGNOSIS — I110 Hypertensive heart disease with heart failure: Secondary | ICD-10-CM | POA: Diagnosis not present

## 2020-12-21 DIAGNOSIS — M797 Fibromyalgia: Secondary | ICD-10-CM | POA: Diagnosis not present

## 2020-12-21 DIAGNOSIS — H8110 Benign paroxysmal vertigo, unspecified ear: Secondary | ICD-10-CM | POA: Diagnosis not present

## 2020-12-21 DIAGNOSIS — M47814 Spondylosis without myelopathy or radiculopathy, thoracic region: Secondary | ICD-10-CM | POA: Diagnosis not present

## 2020-12-21 DIAGNOSIS — H3530 Unspecified macular degeneration: Secondary | ICD-10-CM | POA: Diagnosis not present

## 2020-12-21 DIAGNOSIS — I5032 Chronic diastolic (congestive) heart failure: Secondary | ICD-10-CM | POA: Diagnosis not present

## 2020-12-21 DIAGNOSIS — S72001D Fracture of unspecified part of neck of right femur, subsequent encounter for closed fracture with routine healing: Secondary | ICD-10-CM | POA: Diagnosis not present

## 2020-12-21 DIAGNOSIS — D62 Acute posthemorrhagic anemia: Secondary | ICD-10-CM | POA: Diagnosis not present

## 2020-12-22 DIAGNOSIS — M25551 Pain in right hip: Secondary | ICD-10-CM | POA: Diagnosis not present

## 2020-12-22 DIAGNOSIS — D649 Anemia, unspecified: Secondary | ICD-10-CM | POA: Diagnosis not present

## 2020-12-22 DIAGNOSIS — S72001D Fracture of unspecified part of neck of right femur, subsequent encounter for closed fracture with routine healing: Secondary | ICD-10-CM | POA: Diagnosis not present

## 2020-12-22 DIAGNOSIS — M47814 Spondylosis without myelopathy or radiculopathy, thoracic region: Secondary | ICD-10-CM | POA: Diagnosis not present

## 2020-12-22 DIAGNOSIS — D62 Acute posthemorrhagic anemia: Secondary | ICD-10-CM | POA: Diagnosis not present

## 2020-12-22 DIAGNOSIS — E876 Hypokalemia: Secondary | ICD-10-CM | POA: Diagnosis not present

## 2020-12-26 DIAGNOSIS — L89100 Pressure ulcer of unspecified part of back, unstageable: Secondary | ICD-10-CM | POA: Diagnosis not present

## 2020-12-26 DIAGNOSIS — S2232XD Fracture of one rib, left side, subsequent encounter for fracture with routine healing: Secondary | ICD-10-CM | POA: Diagnosis not present

## 2020-12-26 DIAGNOSIS — Z4789 Encounter for other orthopedic aftercare: Secondary | ICD-10-CM | POA: Diagnosis not present

## 2020-12-27 DIAGNOSIS — H3530 Unspecified macular degeneration: Secondary | ICD-10-CM | POA: Diagnosis not present

## 2020-12-27 DIAGNOSIS — S72001D Fracture of unspecified part of neck of right femur, subsequent encounter for closed fracture with routine healing: Secondary | ICD-10-CM | POA: Diagnosis not present

## 2020-12-27 DIAGNOSIS — M797 Fibromyalgia: Secondary | ICD-10-CM | POA: Diagnosis not present

## 2020-12-27 DIAGNOSIS — I5032 Chronic diastolic (congestive) heart failure: Secondary | ICD-10-CM | POA: Diagnosis not present

## 2020-12-27 DIAGNOSIS — M47814 Spondylosis without myelopathy or radiculopathy, thoracic region: Secondary | ICD-10-CM | POA: Diagnosis not present

## 2020-12-27 DIAGNOSIS — F32A Depression, unspecified: Secondary | ICD-10-CM | POA: Diagnosis not present

## 2020-12-27 DIAGNOSIS — D62 Acute posthemorrhagic anemia: Secondary | ICD-10-CM | POA: Diagnosis not present

## 2020-12-27 DIAGNOSIS — H8110 Benign paroxysmal vertigo, unspecified ear: Secondary | ICD-10-CM | POA: Diagnosis not present

## 2020-12-27 DIAGNOSIS — I110 Hypertensive heart disease with heart failure: Secondary | ICD-10-CM | POA: Diagnosis not present

## 2020-12-28 DIAGNOSIS — M47814 Spondylosis without myelopathy or radiculopathy, thoracic region: Secondary | ICD-10-CM | POA: Diagnosis not present

## 2020-12-28 DIAGNOSIS — H8110 Benign paroxysmal vertigo, unspecified ear: Secondary | ICD-10-CM | POA: Diagnosis not present

## 2020-12-28 DIAGNOSIS — I110 Hypertensive heart disease with heart failure: Secondary | ICD-10-CM | POA: Diagnosis not present

## 2020-12-28 DIAGNOSIS — S72001D Fracture of unspecified part of neck of right femur, subsequent encounter for closed fracture with routine healing: Secondary | ICD-10-CM | POA: Diagnosis not present

## 2020-12-28 DIAGNOSIS — I5032 Chronic diastolic (congestive) heart failure: Secondary | ICD-10-CM | POA: Diagnosis not present

## 2020-12-28 DIAGNOSIS — H3530 Unspecified macular degeneration: Secondary | ICD-10-CM | POA: Diagnosis not present

## 2020-12-28 DIAGNOSIS — M797 Fibromyalgia: Secondary | ICD-10-CM | POA: Diagnosis not present

## 2020-12-28 DIAGNOSIS — D62 Acute posthemorrhagic anemia: Secondary | ICD-10-CM | POA: Diagnosis not present

## 2020-12-28 DIAGNOSIS — F32A Depression, unspecified: Secondary | ICD-10-CM | POA: Diagnosis not present

## 2020-12-29 DIAGNOSIS — S2232XD Fracture of one rib, left side, subsequent encounter for fracture with routine healing: Secondary | ICD-10-CM | POA: Diagnosis not present

## 2020-12-29 DIAGNOSIS — L89100 Pressure ulcer of unspecified part of back, unstageable: Secondary | ICD-10-CM | POA: Diagnosis not present

## 2020-12-29 DIAGNOSIS — Z4789 Encounter for other orthopedic aftercare: Secondary | ICD-10-CM | POA: Diagnosis not present

## 2021-01-08 DIAGNOSIS — I110 Hypertensive heart disease with heart failure: Secondary | ICD-10-CM | POA: Diagnosis not present

## 2021-01-08 DIAGNOSIS — H8110 Benign paroxysmal vertigo, unspecified ear: Secondary | ICD-10-CM | POA: Diagnosis not present

## 2021-01-08 DIAGNOSIS — I5032 Chronic diastolic (congestive) heart failure: Secondary | ICD-10-CM | POA: Diagnosis not present

## 2021-01-08 DIAGNOSIS — H3530 Unspecified macular degeneration: Secondary | ICD-10-CM | POA: Diagnosis not present

## 2021-01-08 DIAGNOSIS — D62 Acute posthemorrhagic anemia: Secondary | ICD-10-CM | POA: Diagnosis not present

## 2021-01-08 DIAGNOSIS — M797 Fibromyalgia: Secondary | ICD-10-CM | POA: Diagnosis not present

## 2021-01-08 DIAGNOSIS — S72001D Fracture of unspecified part of neck of right femur, subsequent encounter for closed fracture with routine healing: Secondary | ICD-10-CM | POA: Diagnosis not present

## 2021-01-08 DIAGNOSIS — M47814 Spondylosis without myelopathy or radiculopathy, thoracic region: Secondary | ICD-10-CM | POA: Diagnosis not present

## 2021-01-08 DIAGNOSIS — F32A Depression, unspecified: Secondary | ICD-10-CM | POA: Diagnosis not present

## 2021-01-12 DIAGNOSIS — H8110 Benign paroxysmal vertigo, unspecified ear: Secondary | ICD-10-CM | POA: Diagnosis not present

## 2021-01-12 DIAGNOSIS — I5032 Chronic diastolic (congestive) heart failure: Secondary | ICD-10-CM | POA: Diagnosis not present

## 2021-01-12 DIAGNOSIS — D62 Acute posthemorrhagic anemia: Secondary | ICD-10-CM | POA: Diagnosis not present

## 2021-01-12 DIAGNOSIS — S72001D Fracture of unspecified part of neck of right femur, subsequent encounter for closed fracture with routine healing: Secondary | ICD-10-CM | POA: Diagnosis not present

## 2021-01-12 DIAGNOSIS — I110 Hypertensive heart disease with heart failure: Secondary | ICD-10-CM | POA: Diagnosis not present

## 2021-01-12 DIAGNOSIS — M47814 Spondylosis without myelopathy or radiculopathy, thoracic region: Secondary | ICD-10-CM | POA: Diagnosis not present

## 2021-01-12 DIAGNOSIS — H3530 Unspecified macular degeneration: Secondary | ICD-10-CM | POA: Diagnosis not present

## 2021-01-12 DIAGNOSIS — F32A Depression, unspecified: Secondary | ICD-10-CM | POA: Diagnosis not present

## 2021-01-12 DIAGNOSIS — M797 Fibromyalgia: Secondary | ICD-10-CM | POA: Diagnosis not present

## 2021-01-17 DIAGNOSIS — M797 Fibromyalgia: Secondary | ICD-10-CM | POA: Diagnosis not present

## 2021-01-17 DIAGNOSIS — S72001D Fracture of unspecified part of neck of right femur, subsequent encounter for closed fracture with routine healing: Secondary | ICD-10-CM | POA: Diagnosis not present

## 2021-01-17 DIAGNOSIS — H3530 Unspecified macular degeneration: Secondary | ICD-10-CM | POA: Diagnosis not present

## 2021-01-17 DIAGNOSIS — M47814 Spondylosis without myelopathy or radiculopathy, thoracic region: Secondary | ICD-10-CM | POA: Diagnosis not present

## 2021-01-17 DIAGNOSIS — F339 Major depressive disorder, recurrent, unspecified: Secondary | ICD-10-CM | POA: Diagnosis not present

## 2021-01-17 DIAGNOSIS — I4891 Unspecified atrial fibrillation: Secondary | ICD-10-CM | POA: Diagnosis not present

## 2021-01-17 DIAGNOSIS — I110 Hypertensive heart disease with heart failure: Secondary | ICD-10-CM | POA: Diagnosis not present

## 2021-01-17 DIAGNOSIS — R32 Unspecified urinary incontinence: Secondary | ICD-10-CM | POA: Diagnosis not present

## 2021-01-17 DIAGNOSIS — I5032 Chronic diastolic (congestive) heart failure: Secondary | ICD-10-CM | POA: Diagnosis not present

## 2021-01-21 DIAGNOSIS — R32 Unspecified urinary incontinence: Secondary | ICD-10-CM | POA: Diagnosis not present

## 2021-01-21 DIAGNOSIS — M47814 Spondylosis without myelopathy or radiculopathy, thoracic region: Secondary | ICD-10-CM | POA: Diagnosis not present

## 2021-01-21 DIAGNOSIS — F339 Major depressive disorder, recurrent, unspecified: Secondary | ICD-10-CM | POA: Diagnosis not present

## 2021-01-21 DIAGNOSIS — S72001D Fracture of unspecified part of neck of right femur, subsequent encounter for closed fracture with routine healing: Secondary | ICD-10-CM | POA: Diagnosis not present

## 2021-01-21 DIAGNOSIS — I4891 Unspecified atrial fibrillation: Secondary | ICD-10-CM | POA: Diagnosis not present

## 2021-01-21 DIAGNOSIS — I5032 Chronic diastolic (congestive) heart failure: Secondary | ICD-10-CM | POA: Diagnosis not present

## 2021-01-21 DIAGNOSIS — H3530 Unspecified macular degeneration: Secondary | ICD-10-CM | POA: Diagnosis not present

## 2021-01-21 DIAGNOSIS — M797 Fibromyalgia: Secondary | ICD-10-CM | POA: Diagnosis not present

## 2021-01-21 DIAGNOSIS — I110 Hypertensive heart disease with heart failure: Secondary | ICD-10-CM | POA: Diagnosis not present

## 2021-01-26 DIAGNOSIS — E440 Moderate protein-calorie malnutrition: Secondary | ICD-10-CM | POA: Diagnosis not present

## 2021-01-26 DIAGNOSIS — L89100 Pressure ulcer of unspecified part of back, unstageable: Secondary | ICD-10-CM | POA: Diagnosis not present

## 2021-01-26 DIAGNOSIS — K5900 Constipation, unspecified: Secondary | ICD-10-CM | POA: Diagnosis not present

## 2021-01-26 DIAGNOSIS — I509 Heart failure, unspecified: Secondary | ICD-10-CM | POA: Diagnosis not present

## 2021-01-26 DIAGNOSIS — Z4789 Encounter for other orthopedic aftercare: Secondary | ICD-10-CM | POA: Diagnosis not present

## 2021-01-26 DIAGNOSIS — K219 Gastro-esophageal reflux disease without esophagitis: Secondary | ICD-10-CM | POA: Diagnosis not present

## 2021-01-26 DIAGNOSIS — J449 Chronic obstructive pulmonary disease, unspecified: Secondary | ICD-10-CM | POA: Diagnosis not present

## 2021-01-26 DIAGNOSIS — I10 Essential (primary) hypertension: Secondary | ICD-10-CM | POA: Diagnosis not present

## 2021-01-26 DIAGNOSIS — L89152 Pressure ulcer of sacral region, stage 2: Secondary | ICD-10-CM | POA: Diagnosis not present

## 2021-01-26 DIAGNOSIS — S2232XD Fracture of one rib, left side, subsequent encounter for fracture with routine healing: Secondary | ICD-10-CM | POA: Diagnosis not present

## 2021-01-27 DIAGNOSIS — I4891 Unspecified atrial fibrillation: Secondary | ICD-10-CM | POA: Diagnosis not present

## 2021-01-27 DIAGNOSIS — M47814 Spondylosis without myelopathy or radiculopathy, thoracic region: Secondary | ICD-10-CM | POA: Diagnosis not present

## 2021-01-27 DIAGNOSIS — M797 Fibromyalgia: Secondary | ICD-10-CM | POA: Diagnosis not present

## 2021-01-27 DIAGNOSIS — I5032 Chronic diastolic (congestive) heart failure: Secondary | ICD-10-CM | POA: Diagnosis not present

## 2021-01-27 DIAGNOSIS — S72001D Fracture of unspecified part of neck of right femur, subsequent encounter for closed fracture with routine healing: Secondary | ICD-10-CM | POA: Diagnosis not present

## 2021-01-27 DIAGNOSIS — I110 Hypertensive heart disease with heart failure: Secondary | ICD-10-CM | POA: Diagnosis not present

## 2021-01-27 DIAGNOSIS — F339 Major depressive disorder, recurrent, unspecified: Secondary | ICD-10-CM | POA: Diagnosis not present

## 2021-01-27 DIAGNOSIS — H3530 Unspecified macular degeneration: Secondary | ICD-10-CM | POA: Diagnosis not present

## 2021-01-27 DIAGNOSIS — R32 Unspecified urinary incontinence: Secondary | ICD-10-CM | POA: Diagnosis not present

## 2021-01-28 DIAGNOSIS — S72001D Fracture of unspecified part of neck of right femur, subsequent encounter for closed fracture with routine healing: Secondary | ICD-10-CM | POA: Diagnosis not present

## 2021-01-28 DIAGNOSIS — M797 Fibromyalgia: Secondary | ICD-10-CM | POA: Diagnosis not present

## 2021-01-28 DIAGNOSIS — M47814 Spondylosis without myelopathy or radiculopathy, thoracic region: Secondary | ICD-10-CM | POA: Diagnosis not present

## 2021-01-29 DIAGNOSIS — S2232XD Fracture of one rib, left side, subsequent encounter for fracture with routine healing: Secondary | ICD-10-CM | POA: Diagnosis not present

## 2021-01-29 DIAGNOSIS — L89100 Pressure ulcer of unspecified part of back, unstageable: Secondary | ICD-10-CM | POA: Diagnosis not present

## 2021-01-29 DIAGNOSIS — Z4789 Encounter for other orthopedic aftercare: Secondary | ICD-10-CM | POA: Diagnosis not present

## 2021-02-02 DIAGNOSIS — F331 Major depressive disorder, recurrent, moderate: Secondary | ICD-10-CM | POA: Diagnosis not present

## 2021-02-02 DIAGNOSIS — I10 Essential (primary) hypertension: Secondary | ICD-10-CM | POA: Diagnosis not present

## 2021-02-02 DIAGNOSIS — M47814 Spondylosis without myelopathy or radiculopathy, thoracic region: Secondary | ICD-10-CM | POA: Diagnosis not present

## 2021-02-02 DIAGNOSIS — H3530 Unspecified macular degeneration: Secondary | ICD-10-CM | POA: Diagnosis not present

## 2021-02-02 DIAGNOSIS — I110 Hypertensive heart disease with heart failure: Secondary | ICD-10-CM | POA: Diagnosis not present

## 2021-02-02 DIAGNOSIS — I509 Heart failure, unspecified: Secondary | ICD-10-CM | POA: Diagnosis not present

## 2021-02-02 DIAGNOSIS — M797 Fibromyalgia: Secondary | ICD-10-CM | POA: Diagnosis not present

## 2021-02-02 DIAGNOSIS — I4891 Unspecified atrial fibrillation: Secondary | ICD-10-CM | POA: Diagnosis not present

## 2021-02-02 DIAGNOSIS — E785 Hyperlipidemia, unspecified: Secondary | ICD-10-CM | POA: Diagnosis not present

## 2021-02-02 DIAGNOSIS — R32 Unspecified urinary incontinence: Secondary | ICD-10-CM | POA: Diagnosis not present

## 2021-02-02 DIAGNOSIS — S72001D Fracture of unspecified part of neck of right femur, subsequent encounter for closed fracture with routine healing: Secondary | ICD-10-CM | POA: Diagnosis not present

## 2021-02-02 DIAGNOSIS — F339 Major depressive disorder, recurrent, unspecified: Secondary | ICD-10-CM | POA: Diagnosis not present

## 2021-02-02 DIAGNOSIS — I5032 Chronic diastolic (congestive) heart failure: Secondary | ICD-10-CM | POA: Diagnosis not present

## 2021-02-02 DIAGNOSIS — F0390 Unspecified dementia without behavioral disturbance: Secondary | ICD-10-CM | POA: Diagnosis not present

## 2021-02-02 DIAGNOSIS — J449 Chronic obstructive pulmonary disease, unspecified: Secondary | ICD-10-CM | POA: Diagnosis not present

## 2021-02-03 DIAGNOSIS — I110 Hypertensive heart disease with heart failure: Secondary | ICD-10-CM | POA: Diagnosis not present

## 2021-02-03 DIAGNOSIS — I4891 Unspecified atrial fibrillation: Secondary | ICD-10-CM | POA: Diagnosis not present

## 2021-02-03 DIAGNOSIS — R32 Unspecified urinary incontinence: Secondary | ICD-10-CM | POA: Diagnosis not present

## 2021-02-03 DIAGNOSIS — M797 Fibromyalgia: Secondary | ICD-10-CM | POA: Diagnosis not present

## 2021-02-03 DIAGNOSIS — H3530 Unspecified macular degeneration: Secondary | ICD-10-CM | POA: Diagnosis not present

## 2021-02-03 DIAGNOSIS — F339 Major depressive disorder, recurrent, unspecified: Secondary | ICD-10-CM | POA: Diagnosis not present

## 2021-02-03 DIAGNOSIS — S72001D Fracture of unspecified part of neck of right femur, subsequent encounter for closed fracture with routine healing: Secondary | ICD-10-CM | POA: Diagnosis not present

## 2021-02-03 DIAGNOSIS — M47814 Spondylosis without myelopathy or radiculopathy, thoracic region: Secondary | ICD-10-CM | POA: Diagnosis not present

## 2021-02-03 DIAGNOSIS — I5032 Chronic diastolic (congestive) heart failure: Secondary | ICD-10-CM | POA: Diagnosis not present

## 2021-02-08 DIAGNOSIS — S72001D Fracture of unspecified part of neck of right femur, subsequent encounter for closed fracture with routine healing: Secondary | ICD-10-CM | POA: Diagnosis not present

## 2021-02-08 DIAGNOSIS — I110 Hypertensive heart disease with heart failure: Secondary | ICD-10-CM | POA: Diagnosis not present

## 2021-02-08 DIAGNOSIS — F339 Major depressive disorder, recurrent, unspecified: Secondary | ICD-10-CM | POA: Diagnosis not present

## 2021-02-08 DIAGNOSIS — R32 Unspecified urinary incontinence: Secondary | ICD-10-CM | POA: Diagnosis not present

## 2021-02-08 DIAGNOSIS — M797 Fibromyalgia: Secondary | ICD-10-CM | POA: Diagnosis not present

## 2021-02-08 DIAGNOSIS — H3530 Unspecified macular degeneration: Secondary | ICD-10-CM | POA: Diagnosis not present

## 2021-02-08 DIAGNOSIS — I5032 Chronic diastolic (congestive) heart failure: Secondary | ICD-10-CM | POA: Diagnosis not present

## 2021-02-08 DIAGNOSIS — M47814 Spondylosis without myelopathy or radiculopathy, thoracic region: Secondary | ICD-10-CM | POA: Diagnosis not present

## 2021-02-08 DIAGNOSIS — I4891 Unspecified atrial fibrillation: Secondary | ICD-10-CM | POA: Diagnosis not present

## 2021-02-09 DIAGNOSIS — J302 Other seasonal allergic rhinitis: Secondary | ICD-10-CM | POA: Diagnosis not present

## 2021-02-09 DIAGNOSIS — D509 Iron deficiency anemia, unspecified: Secondary | ICD-10-CM | POA: Diagnosis not present

## 2021-02-09 DIAGNOSIS — S32592D Other specified fracture of left pubis, subsequent encounter for fracture with routine healing: Secondary | ICD-10-CM | POA: Diagnosis not present

## 2021-02-09 DIAGNOSIS — R5381 Other malaise: Secondary | ICD-10-CM | POA: Diagnosis not present

## 2021-02-09 DIAGNOSIS — I10 Essential (primary) hypertension: Secondary | ICD-10-CM | POA: Diagnosis not present

## 2021-02-09 DIAGNOSIS — R279 Unspecified lack of coordination: Secondary | ICD-10-CM | POA: Diagnosis not present

## 2021-02-09 DIAGNOSIS — M25552 Pain in left hip: Secondary | ICD-10-CM | POA: Diagnosis not present

## 2021-02-09 DIAGNOSIS — W19XXXD Unspecified fall, subsequent encounter: Secondary | ICD-10-CM | POA: Diagnosis not present

## 2021-02-09 DIAGNOSIS — W19XXXA Unspecified fall, initial encounter: Secondary | ICD-10-CM | POA: Diagnosis not present

## 2021-02-09 DIAGNOSIS — R2681 Unsteadiness on feet: Secondary | ICD-10-CM | POA: Diagnosis not present

## 2021-02-09 DIAGNOSIS — R531 Weakness: Secondary | ICD-10-CM | POA: Diagnosis not present

## 2021-02-09 DIAGNOSIS — S72001D Fracture of unspecified part of neck of right femur, subsequent encounter for closed fracture with routine healing: Secondary | ICD-10-CM | POA: Diagnosis not present

## 2021-02-09 DIAGNOSIS — E119 Type 2 diabetes mellitus without complications: Secondary | ICD-10-CM | POA: Diagnosis not present

## 2021-02-09 DIAGNOSIS — Z743 Need for continuous supervision: Secondary | ICD-10-CM | POA: Diagnosis not present

## 2021-02-09 DIAGNOSIS — G8911 Acute pain due to trauma: Secondary | ICD-10-CM | POA: Diagnosis not present

## 2021-02-09 DIAGNOSIS — M6281 Muscle weakness (generalized): Secondary | ICD-10-CM | POA: Diagnosis not present

## 2021-02-09 DIAGNOSIS — Z043 Encounter for examination and observation following other accident: Secondary | ICD-10-CM | POA: Diagnosis not present

## 2021-02-09 DIAGNOSIS — R41841 Cognitive communication deficit: Secondary | ICD-10-CM | POA: Diagnosis not present

## 2021-02-09 DIAGNOSIS — R2689 Other abnormalities of gait and mobility: Secondary | ICD-10-CM | POA: Diagnosis not present

## 2021-02-09 DIAGNOSIS — F32A Depression, unspecified: Secondary | ICD-10-CM | POA: Diagnosis not present

## 2021-02-09 DIAGNOSIS — R609 Edema, unspecified: Secondary | ICD-10-CM | POA: Diagnosis not present

## 2021-02-09 DIAGNOSIS — R410 Disorientation, unspecified: Secondary | ICD-10-CM | POA: Diagnosis not present

## 2021-02-09 DIAGNOSIS — S72001A Fracture of unspecified part of neck of right femur, initial encounter for closed fracture: Secondary | ICD-10-CM | POA: Diagnosis not present

## 2021-02-09 DIAGNOSIS — R29898 Other symptoms and signs involving the musculoskeletal system: Secondary | ICD-10-CM | POA: Diagnosis not present

## 2021-02-09 DIAGNOSIS — S3289XA Fracture of other parts of pelvis, initial encounter for closed fracture: Secondary | ICD-10-CM | POA: Diagnosis not present

## 2021-02-09 DIAGNOSIS — K219 Gastro-esophageal reflux disease without esophagitis: Secondary | ICD-10-CM | POA: Diagnosis not present

## 2021-02-09 DIAGNOSIS — Z993 Dependence on wheelchair: Secondary | ICD-10-CM | POA: Diagnosis not present

## 2021-02-09 DIAGNOSIS — S32592A Other specified fracture of left pubis, initial encounter for closed fracture: Secondary | ICD-10-CM | POA: Diagnosis not present

## 2021-02-12 DIAGNOSIS — S32509D Unspecified fracture of unspecified pubis, subsequent encounter for fracture with routine healing: Secondary | ICD-10-CM | POA: Diagnosis not present

## 2021-02-12 DIAGNOSIS — Z743 Need for continuous supervision: Secondary | ICD-10-CM | POA: Diagnosis not present

## 2021-02-12 DIAGNOSIS — E119 Type 2 diabetes mellitus without complications: Secondary | ICD-10-CM | POA: Diagnosis not present

## 2021-02-12 DIAGNOSIS — N179 Acute kidney failure, unspecified: Secondary | ICD-10-CM | POA: Diagnosis not present

## 2021-02-12 DIAGNOSIS — S72044A Nondisplaced fracture of base of neck of right femur, initial encounter for closed fracture: Secondary | ICD-10-CM | POA: Diagnosis not present

## 2021-02-12 DIAGNOSIS — K219 Gastro-esophageal reflux disease without esophagitis: Secondary | ICD-10-CM | POA: Diagnosis not present

## 2021-02-12 DIAGNOSIS — H8110 Benign paroxysmal vertigo, unspecified ear: Secondary | ICD-10-CM | POA: Diagnosis not present

## 2021-02-12 DIAGNOSIS — R4189 Other symptoms and signs involving cognitive functions and awareness: Secondary | ICD-10-CM | POA: Diagnosis not present

## 2021-02-12 DIAGNOSIS — R300 Dysuria: Secondary | ICD-10-CM | POA: Diagnosis not present

## 2021-02-12 DIAGNOSIS — S72001A Fracture of unspecified part of neck of right femur, initial encounter for closed fracture: Secondary | ICD-10-CM | POA: Diagnosis not present

## 2021-02-12 DIAGNOSIS — D509 Iron deficiency anemia, unspecified: Secondary | ICD-10-CM | POA: Diagnosis not present

## 2021-02-12 DIAGNOSIS — N390 Urinary tract infection, site not specified: Secondary | ICD-10-CM | POA: Diagnosis not present

## 2021-02-12 DIAGNOSIS — F4320 Adjustment disorder, unspecified: Secondary | ICD-10-CM | POA: Diagnosis not present

## 2021-02-12 DIAGNOSIS — E870 Hyperosmolality and hypernatremia: Secondary | ICD-10-CM | POA: Diagnosis not present

## 2021-02-12 DIAGNOSIS — R279 Unspecified lack of coordination: Secondary | ICD-10-CM | POA: Diagnosis not present

## 2021-02-12 DIAGNOSIS — R2689 Other abnormalities of gait and mobility: Secondary | ICD-10-CM | POA: Diagnosis not present

## 2021-02-12 DIAGNOSIS — S32592D Other specified fracture of left pubis, subsequent encounter for fracture with routine healing: Secondary | ICD-10-CM | POA: Diagnosis not present

## 2021-02-12 DIAGNOSIS — I5032 Chronic diastolic (congestive) heart failure: Secondary | ICD-10-CM | POA: Diagnosis not present

## 2021-02-12 DIAGNOSIS — L89100 Pressure ulcer of unspecified part of back, unstageable: Secondary | ICD-10-CM | POA: Diagnosis not present

## 2021-02-12 DIAGNOSIS — L853 Xerosis cutis: Secondary | ICD-10-CM | POA: Diagnosis not present

## 2021-02-12 DIAGNOSIS — M6281 Muscle weakness (generalized): Secondary | ICD-10-CM | POA: Diagnosis not present

## 2021-02-12 DIAGNOSIS — R2681 Unsteadiness on feet: Secondary | ICD-10-CM | POA: Diagnosis not present

## 2021-02-12 DIAGNOSIS — W19XXXD Unspecified fall, subsequent encounter: Secondary | ICD-10-CM | POA: Diagnosis not present

## 2021-02-12 DIAGNOSIS — S72001D Fracture of unspecified part of neck of right femur, subsequent encounter for closed fracture with routine healing: Secondary | ICD-10-CM | POA: Diagnosis not present

## 2021-02-12 DIAGNOSIS — R339 Retention of urine, unspecified: Secondary | ICD-10-CM | POA: Diagnosis not present

## 2021-02-12 DIAGNOSIS — R5381 Other malaise: Secondary | ICD-10-CM | POA: Diagnosis not present

## 2021-02-12 DIAGNOSIS — Z4789 Encounter for other orthopedic aftercare: Secondary | ICD-10-CM | POA: Diagnosis not present

## 2021-02-12 DIAGNOSIS — R41841 Cognitive communication deficit: Secondary | ICD-10-CM | POA: Diagnosis not present

## 2021-02-12 DIAGNOSIS — G629 Polyneuropathy, unspecified: Secondary | ICD-10-CM | POA: Diagnosis not present

## 2021-02-12 DIAGNOSIS — F330 Major depressive disorder, recurrent, mild: Secondary | ICD-10-CM | POA: Diagnosis not present

## 2021-02-12 DIAGNOSIS — R195 Other fecal abnormalities: Secondary | ICD-10-CM | POA: Diagnosis not present

## 2021-02-12 DIAGNOSIS — R059 Cough, unspecified: Secondary | ICD-10-CM | POA: Diagnosis not present

## 2021-02-12 DIAGNOSIS — B9620 Unspecified Escherichia coli [E. coli] as the cause of diseases classified elsewhere: Secondary | ICD-10-CM | POA: Diagnosis not present

## 2021-02-12 DIAGNOSIS — R262 Difficulty in walking, not elsewhere classified: Secondary | ICD-10-CM | POA: Diagnosis not present

## 2021-02-12 DIAGNOSIS — I10 Essential (primary) hypertension: Secondary | ICD-10-CM | POA: Diagnosis not present

## 2021-02-12 DIAGNOSIS — S32509A Unspecified fracture of unspecified pubis, initial encounter for closed fracture: Secondary | ICD-10-CM | POA: Diagnosis not present

## 2021-02-12 DIAGNOSIS — S2232XD Fracture of one rib, left side, subsequent encounter for fracture with routine healing: Secondary | ICD-10-CM | POA: Diagnosis not present

## 2021-02-16 DIAGNOSIS — R262 Difficulty in walking, not elsewhere classified: Secondary | ICD-10-CM | POA: Diagnosis not present

## 2021-02-16 DIAGNOSIS — K219 Gastro-esophageal reflux disease without esophagitis: Secondary | ICD-10-CM | POA: Diagnosis not present

## 2021-02-16 DIAGNOSIS — I5032 Chronic diastolic (congestive) heart failure: Secondary | ICD-10-CM | POA: Diagnosis not present

## 2021-02-16 DIAGNOSIS — I10 Essential (primary) hypertension: Secondary | ICD-10-CM | POA: Diagnosis not present

## 2021-02-16 DIAGNOSIS — S32509A Unspecified fracture of unspecified pubis, initial encounter for closed fracture: Secondary | ICD-10-CM | POA: Diagnosis not present

## 2021-02-16 DIAGNOSIS — R339 Retention of urine, unspecified: Secondary | ICD-10-CM | POA: Diagnosis not present

## 2021-02-16 DIAGNOSIS — R059 Cough, unspecified: Secondary | ICD-10-CM | POA: Diagnosis not present

## 2021-02-16 DIAGNOSIS — R4189 Other symptoms and signs involving cognitive functions and awareness: Secondary | ICD-10-CM | POA: Diagnosis not present

## 2021-02-16 DIAGNOSIS — D509 Iron deficiency anemia, unspecified: Secondary | ICD-10-CM | POA: Diagnosis not present

## 2021-02-23 DIAGNOSIS — I10 Essential (primary) hypertension: Secondary | ICD-10-CM | POA: Diagnosis not present

## 2021-02-23 DIAGNOSIS — S32509D Unspecified fracture of unspecified pubis, subsequent encounter for fracture with routine healing: Secondary | ICD-10-CM | POA: Diagnosis not present

## 2021-02-23 DIAGNOSIS — H8110 Benign paroxysmal vertigo, unspecified ear: Secondary | ICD-10-CM | POA: Diagnosis not present

## 2021-02-23 DIAGNOSIS — I5032 Chronic diastolic (congestive) heart failure: Secondary | ICD-10-CM | POA: Diagnosis not present

## 2021-02-23 DIAGNOSIS — R339 Retention of urine, unspecified: Secondary | ICD-10-CM | POA: Diagnosis not present

## 2021-03-03 DIAGNOSIS — S72044A Nondisplaced fracture of base of neck of right femur, initial encounter for closed fracture: Secondary | ICD-10-CM | POA: Diagnosis not present

## 2021-03-03 DIAGNOSIS — N179 Acute kidney failure, unspecified: Secondary | ICD-10-CM | POA: Diagnosis not present

## 2021-03-03 DIAGNOSIS — E870 Hyperosmolality and hypernatremia: Secondary | ICD-10-CM | POA: Diagnosis not present

## 2021-03-03 DIAGNOSIS — N390 Urinary tract infection, site not specified: Secondary | ICD-10-CM | POA: Diagnosis not present

## 2021-03-05 DIAGNOSIS — N390 Urinary tract infection, site not specified: Secondary | ICD-10-CM | POA: Diagnosis not present

## 2021-03-05 DIAGNOSIS — R300 Dysuria: Secondary | ICD-10-CM | POA: Diagnosis not present

## 2021-03-05 DIAGNOSIS — R339 Retention of urine, unspecified: Secondary | ICD-10-CM | POA: Diagnosis not present

## 2021-03-16 DIAGNOSIS — R4189 Other symptoms and signs involving cognitive functions and awareness: Secondary | ICD-10-CM | POA: Diagnosis not present

## 2021-03-16 DIAGNOSIS — K219 Gastro-esophageal reflux disease without esophagitis: Secondary | ICD-10-CM | POA: Diagnosis not present

## 2021-03-16 DIAGNOSIS — D509 Iron deficiency anemia, unspecified: Secondary | ICD-10-CM | POA: Diagnosis not present

## 2021-03-16 DIAGNOSIS — I10 Essential (primary) hypertension: Secondary | ICD-10-CM | POA: Diagnosis not present

## 2021-03-16 DIAGNOSIS — I5032 Chronic diastolic (congestive) heart failure: Secondary | ICD-10-CM | POA: Diagnosis not present

## 2021-03-16 DIAGNOSIS — S32509D Unspecified fracture of unspecified pubis, subsequent encounter for fracture with routine healing: Secondary | ICD-10-CM | POA: Diagnosis not present

## 2021-03-23 DIAGNOSIS — R4189 Other symptoms and signs involving cognitive functions and awareness: Secondary | ICD-10-CM | POA: Diagnosis not present

## 2021-03-23 DIAGNOSIS — N390 Urinary tract infection, site not specified: Secondary | ICD-10-CM | POA: Diagnosis not present

## 2021-03-23 DIAGNOSIS — I10 Essential (primary) hypertension: Secondary | ICD-10-CM | POA: Diagnosis not present

## 2021-03-23 DIAGNOSIS — R195 Other fecal abnormalities: Secondary | ICD-10-CM | POA: Diagnosis not present

## 2021-03-23 DIAGNOSIS — D509 Iron deficiency anemia, unspecified: Secondary | ICD-10-CM | POA: Diagnosis not present

## 2021-03-23 DIAGNOSIS — I5032 Chronic diastolic (congestive) heart failure: Secondary | ICD-10-CM | POA: Diagnosis not present

## 2021-04-06 DIAGNOSIS — K219 Gastro-esophageal reflux disease without esophagitis: Secondary | ICD-10-CM | POA: Diagnosis not present

## 2021-04-06 DIAGNOSIS — I10 Essential (primary) hypertension: Secondary | ICD-10-CM | POA: Diagnosis not present

## 2021-04-06 DIAGNOSIS — D509 Iron deficiency anemia, unspecified: Secondary | ICD-10-CM | POA: Diagnosis not present

## 2021-04-06 DIAGNOSIS — R262 Difficulty in walking, not elsewhere classified: Secondary | ICD-10-CM | POA: Diagnosis not present

## 2021-04-06 DIAGNOSIS — I5032 Chronic diastolic (congestive) heart failure: Secondary | ICD-10-CM | POA: Diagnosis not present

## 2021-04-06 DIAGNOSIS — H8110 Benign paroxysmal vertigo, unspecified ear: Secondary | ICD-10-CM | POA: Diagnosis not present

## 2021-04-06 DIAGNOSIS — R4189 Other symptoms and signs involving cognitive functions and awareness: Secondary | ICD-10-CM | POA: Diagnosis not present

## 2021-04-06 DIAGNOSIS — G629 Polyneuropathy, unspecified: Secondary | ICD-10-CM | POA: Diagnosis not present

## 2021-04-06 DIAGNOSIS — L853 Xerosis cutis: Secondary | ICD-10-CM | POA: Diagnosis not present

## 2021-04-13 DIAGNOSIS — I5032 Chronic diastolic (congestive) heart failure: Secondary | ICD-10-CM | POA: Diagnosis not present

## 2021-04-13 DIAGNOSIS — K219 Gastro-esophageal reflux disease without esophagitis: Secondary | ICD-10-CM | POA: Diagnosis not present

## 2021-04-13 DIAGNOSIS — I10 Essential (primary) hypertension: Secondary | ICD-10-CM | POA: Diagnosis not present

## 2021-04-13 DIAGNOSIS — R4189 Other symptoms and signs involving cognitive functions and awareness: Secondary | ICD-10-CM | POA: Diagnosis not present

## 2021-04-13 DIAGNOSIS — S32509D Unspecified fracture of unspecified pubis, subsequent encounter for fracture with routine healing: Secondary | ICD-10-CM | POA: Diagnosis not present

## 2021-04-13 DIAGNOSIS — D509 Iron deficiency anemia, unspecified: Secondary | ICD-10-CM | POA: Diagnosis not present

## 2021-05-07 DIAGNOSIS — B351 Tinea unguium: Secondary | ICD-10-CM | POA: Diagnosis not present

## 2021-05-07 DIAGNOSIS — I7091 Generalized atherosclerosis: Secondary | ICD-10-CM | POA: Diagnosis not present

## 2021-06-23 DIAGNOSIS — E7849 Other hyperlipidemia: Secondary | ICD-10-CM | POA: Diagnosis not present

## 2021-06-23 DIAGNOSIS — E119 Type 2 diabetes mellitus without complications: Secondary | ICD-10-CM | POA: Diagnosis not present

## 2021-06-23 DIAGNOSIS — E038 Other specified hypothyroidism: Secondary | ICD-10-CM | POA: Diagnosis not present

## 2021-06-23 DIAGNOSIS — Z79899 Other long term (current) drug therapy: Secondary | ICD-10-CM | POA: Diagnosis not present

## 2021-06-23 DIAGNOSIS — D518 Other vitamin B12 deficiency anemias: Secondary | ICD-10-CM | POA: Diagnosis not present

## 2021-06-25 DIAGNOSIS — J449 Chronic obstructive pulmonary disease, unspecified: Secondary | ICD-10-CM | POA: Diagnosis not present

## 2021-06-25 DIAGNOSIS — F0390 Unspecified dementia without behavioral disturbance: Secondary | ICD-10-CM | POA: Diagnosis not present

## 2021-06-25 DIAGNOSIS — E785 Hyperlipidemia, unspecified: Secondary | ICD-10-CM | POA: Diagnosis not present

## 2021-06-25 DIAGNOSIS — F331 Major depressive disorder, recurrent, moderate: Secondary | ICD-10-CM | POA: Diagnosis not present

## 2021-06-25 DIAGNOSIS — I509 Heart failure, unspecified: Secondary | ICD-10-CM | POA: Diagnosis not present

## 2021-06-25 DIAGNOSIS — D518 Other vitamin B12 deficiency anemias: Secondary | ICD-10-CM | POA: Diagnosis not present

## 2021-06-25 DIAGNOSIS — E119 Type 2 diabetes mellitus without complications: Secondary | ICD-10-CM | POA: Diagnosis not present

## 2021-06-25 DIAGNOSIS — E038 Other specified hypothyroidism: Secondary | ICD-10-CM | POA: Diagnosis not present

## 2021-06-25 DIAGNOSIS — I10 Essential (primary) hypertension: Secondary | ICD-10-CM | POA: Diagnosis not present

## 2021-06-29 DIAGNOSIS — E785 Hyperlipidemia, unspecified: Secondary | ICD-10-CM | POA: Diagnosis not present

## 2021-06-29 DIAGNOSIS — Z79899 Other long term (current) drug therapy: Secondary | ICD-10-CM | POA: Diagnosis not present

## 2021-06-29 DIAGNOSIS — E538 Deficiency of other specified B group vitamins: Secondary | ICD-10-CM | POA: Diagnosis not present

## 2021-07-01 DIAGNOSIS — E785 Hyperlipidemia, unspecified: Secondary | ICD-10-CM | POA: Diagnosis not present

## 2021-07-01 DIAGNOSIS — J449 Chronic obstructive pulmonary disease, unspecified: Secondary | ICD-10-CM | POA: Diagnosis not present

## 2021-07-01 DIAGNOSIS — F331 Major depressive disorder, recurrent, moderate: Secondary | ICD-10-CM | POA: Diagnosis not present

## 2021-07-01 DIAGNOSIS — I10 Essential (primary) hypertension: Secondary | ICD-10-CM | POA: Diagnosis not present

## 2021-07-01 DIAGNOSIS — E038 Other specified hypothyroidism: Secondary | ICD-10-CM | POA: Diagnosis not present

## 2021-07-01 DIAGNOSIS — D518 Other vitamin B12 deficiency anemias: Secondary | ICD-10-CM | POA: Diagnosis not present

## 2021-07-01 DIAGNOSIS — F0390 Unspecified dementia without behavioral disturbance: Secondary | ICD-10-CM | POA: Diagnosis not present

## 2021-07-01 DIAGNOSIS — I509 Heart failure, unspecified: Secondary | ICD-10-CM | POA: Diagnosis not present

## 2021-07-01 DIAGNOSIS — E119 Type 2 diabetes mellitus without complications: Secondary | ICD-10-CM | POA: Diagnosis not present

## 2021-07-08 DIAGNOSIS — I7091 Generalized atherosclerosis: Secondary | ICD-10-CM | POA: Diagnosis not present

## 2021-07-08 DIAGNOSIS — B351 Tinea unguium: Secondary | ICD-10-CM | POA: Diagnosis not present

## 2021-07-16 DIAGNOSIS — J449 Chronic obstructive pulmonary disease, unspecified: Secondary | ICD-10-CM | POA: Diagnosis not present

## 2021-07-16 DIAGNOSIS — M5030 Other cervical disc degeneration, unspecified cervical region: Secondary | ICD-10-CM | POA: Diagnosis not present

## 2021-07-16 DIAGNOSIS — I509 Heart failure, unspecified: Secondary | ICD-10-CM | POA: Diagnosis not present

## 2021-07-16 DIAGNOSIS — J302 Other seasonal allergic rhinitis: Secondary | ICD-10-CM | POA: Diagnosis not present

## 2021-07-16 DIAGNOSIS — R42 Dizziness and giddiness: Secondary | ICD-10-CM | POA: Diagnosis not present

## 2021-07-16 DIAGNOSIS — E785 Hyperlipidemia, unspecified: Secondary | ICD-10-CM | POA: Diagnosis not present

## 2021-07-16 DIAGNOSIS — G2581 Restless legs syndrome: Secondary | ICD-10-CM | POA: Diagnosis not present

## 2021-07-16 DIAGNOSIS — M479 Spondylosis, unspecified: Secondary | ICD-10-CM | POA: Diagnosis not present

## 2021-07-16 DIAGNOSIS — I10 Essential (primary) hypertension: Secondary | ICD-10-CM | POA: Diagnosis not present

## 2021-07-29 DIAGNOSIS — F331 Major depressive disorder, recurrent, moderate: Secondary | ICD-10-CM | POA: Diagnosis not present

## 2021-07-29 DIAGNOSIS — E119 Type 2 diabetes mellitus without complications: Secondary | ICD-10-CM | POA: Diagnosis not present

## 2021-07-29 DIAGNOSIS — I509 Heart failure, unspecified: Secondary | ICD-10-CM | POA: Diagnosis not present

## 2021-07-29 DIAGNOSIS — E785 Hyperlipidemia, unspecified: Secondary | ICD-10-CM | POA: Diagnosis not present

## 2021-07-29 DIAGNOSIS — I10 Essential (primary) hypertension: Secondary | ICD-10-CM | POA: Diagnosis not present

## 2021-07-29 DIAGNOSIS — F0390 Unspecified dementia without behavioral disturbance: Secondary | ICD-10-CM | POA: Diagnosis not present

## 2021-07-29 DIAGNOSIS — E038 Other specified hypothyroidism: Secondary | ICD-10-CM | POA: Diagnosis not present

## 2021-07-29 DIAGNOSIS — D518 Other vitamin B12 deficiency anemias: Secondary | ICD-10-CM | POA: Diagnosis not present

## 2021-07-29 DIAGNOSIS — J449 Chronic obstructive pulmonary disease, unspecified: Secondary | ICD-10-CM | POA: Diagnosis not present

## 2021-08-07 DIAGNOSIS — R059 Cough, unspecified: Secondary | ICD-10-CM | POA: Diagnosis not present

## 2021-08-13 DIAGNOSIS — J302 Other seasonal allergic rhinitis: Secondary | ICD-10-CM | POA: Diagnosis not present

## 2021-08-13 DIAGNOSIS — M479 Spondylosis, unspecified: Secondary | ICD-10-CM | POA: Diagnosis not present

## 2021-08-13 DIAGNOSIS — H3530 Unspecified macular degeneration: Secondary | ICD-10-CM | POA: Diagnosis not present

## 2021-08-13 DIAGNOSIS — I10 Essential (primary) hypertension: Secondary | ICD-10-CM | POA: Diagnosis not present

## 2021-08-13 DIAGNOSIS — J449 Chronic obstructive pulmonary disease, unspecified: Secondary | ICD-10-CM | POA: Diagnosis not present

## 2021-08-13 DIAGNOSIS — G2581 Restless legs syndrome: Secondary | ICD-10-CM | POA: Diagnosis not present

## 2021-08-13 DIAGNOSIS — I509 Heart failure, unspecified: Secondary | ICD-10-CM | POA: Diagnosis not present

## 2021-08-29 IMAGING — RF DG HIP (WITH PELVIS) OPERATIVE*R*
1 series · 2 of 2 positions shown · non-contrast
Comparison: None.

CLINICAL DATA: Right hip fracture repair.

FLUOROSCOPY TIME:  46 seconds.
EXAM:
OPERATIVE RIGHT HIP (WITH PELVIS IF PERFORMED) 2 VIEWS
TECHNIQUE: Fluoroscopic spot image(s) were submitted for interpretation
post-operatively.

[Series 1: unknown protocol · 0.20mm/px · 2 of 2 slices shown]
[im 1/2]
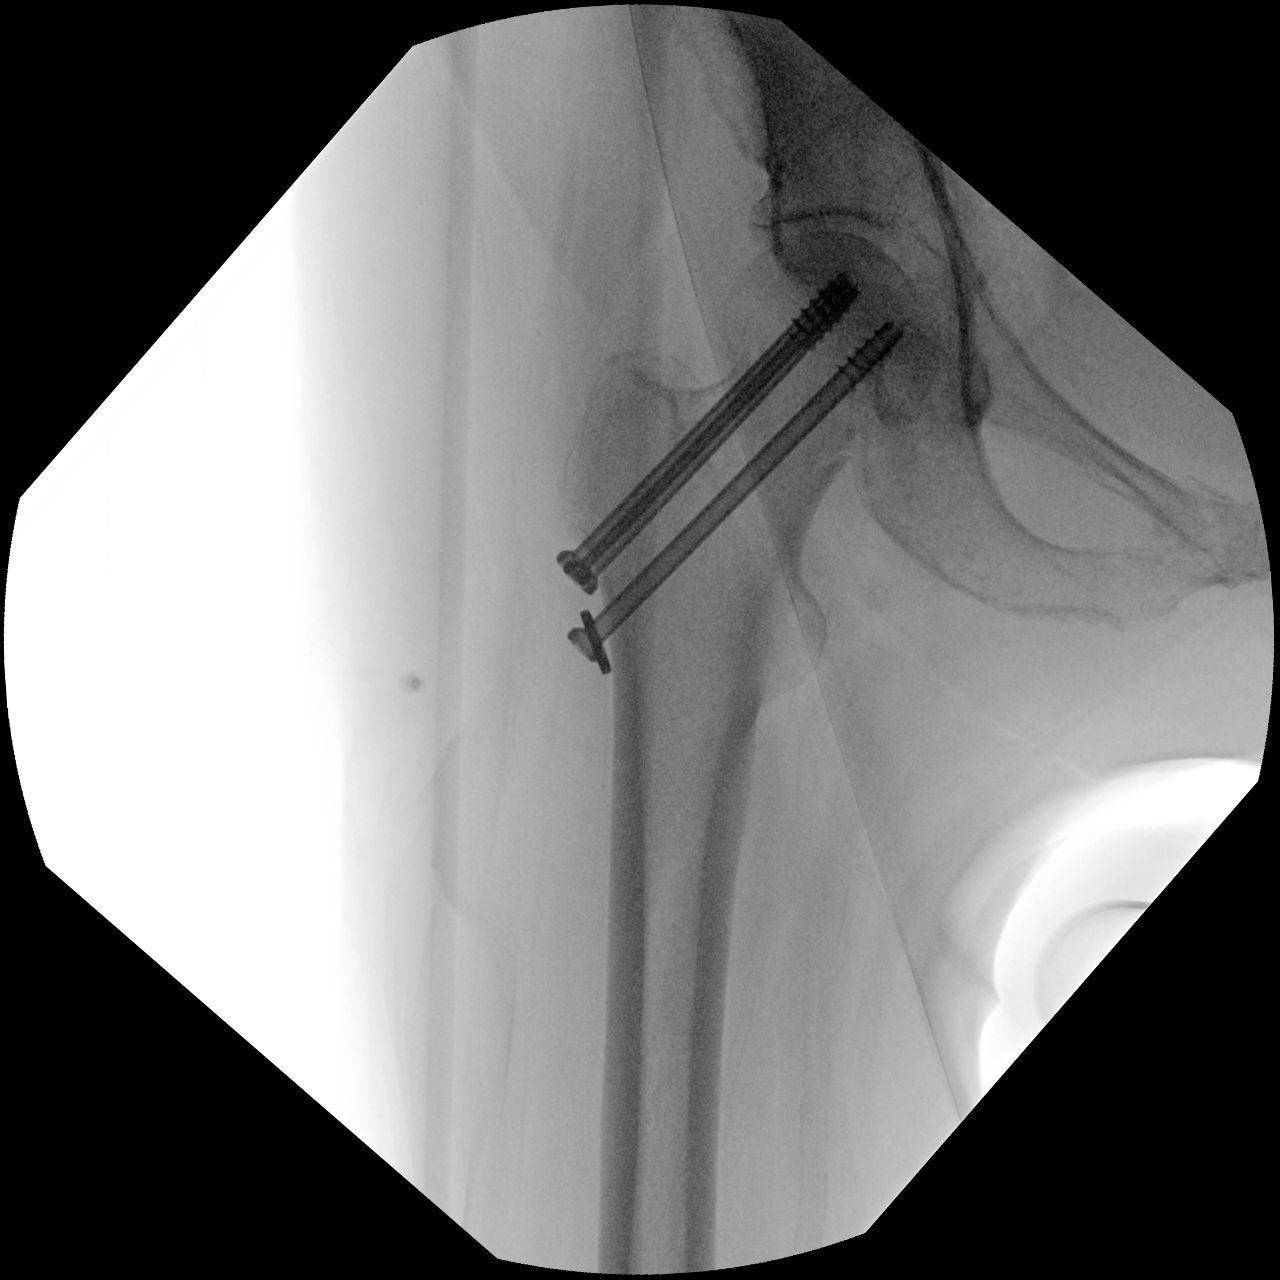
[im 2/2]
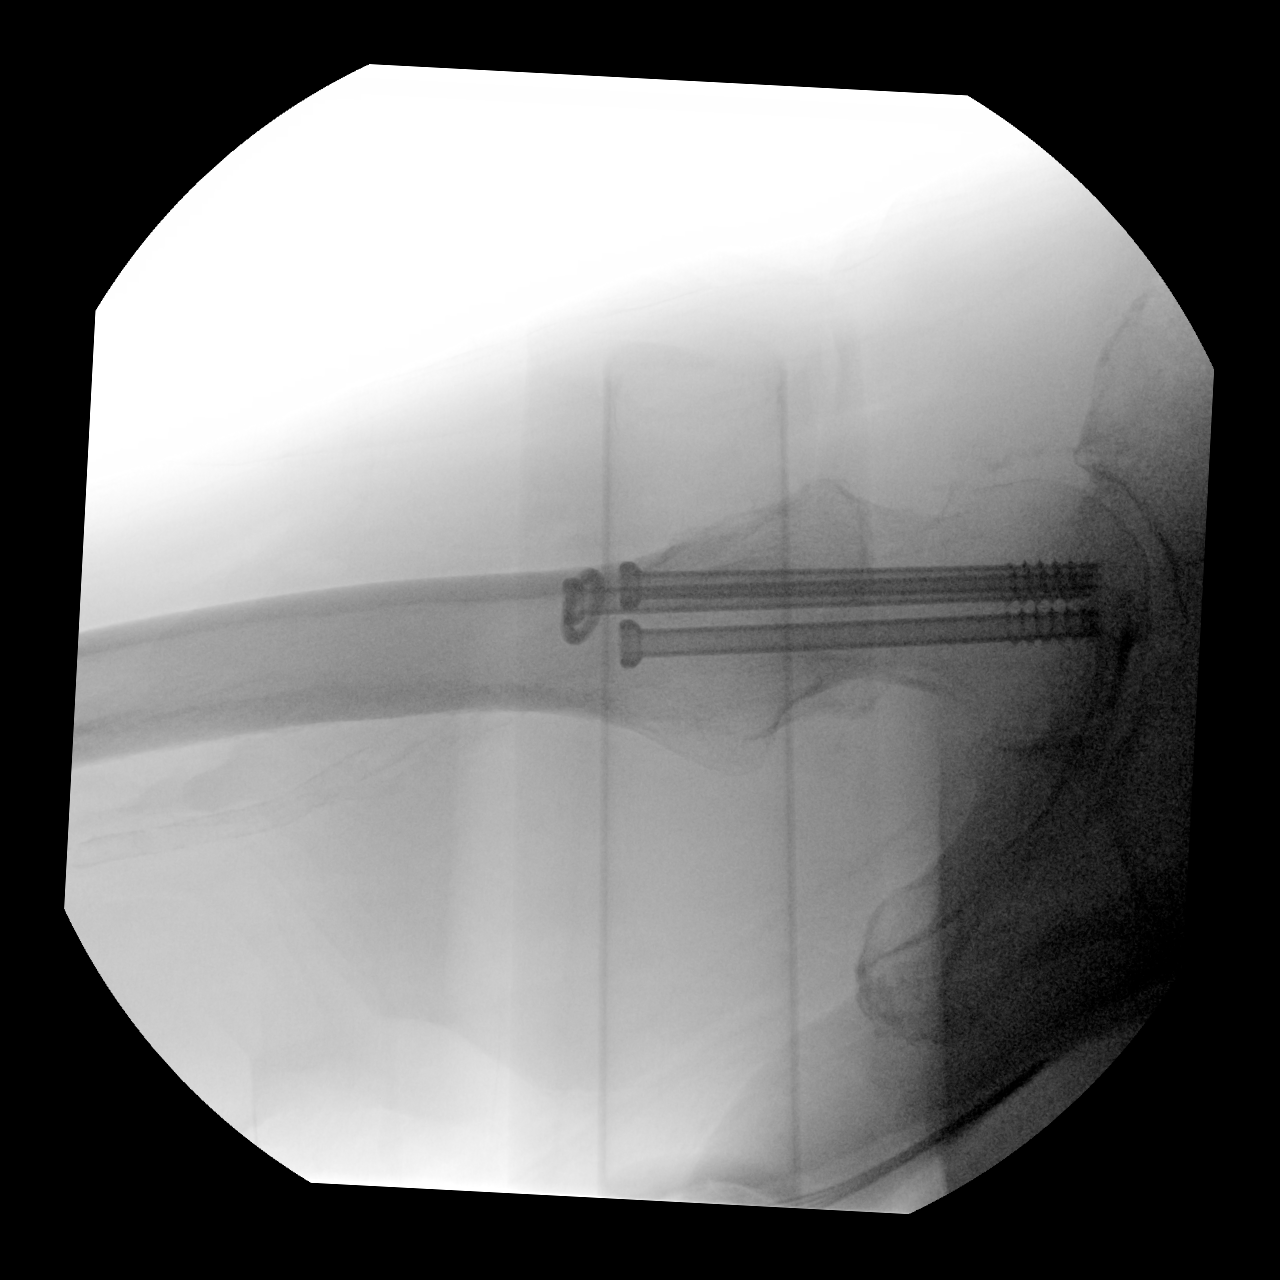

[2 of 2 positions shown; findings below may reference images not displayed]

FINDINGS: Three nails have been placed across the right femoral neck fracture.
IMPRESSION: Three nails have been placed across the right femoral neck fracture.

## 2021-08-29 IMAGING — CR DG HIP (WITH OR WITHOUT PELVIS) 2-3V*R*
4 series · 4 of 4 positions shown · non-contrast
Comparison: None.

CLINICAL DATA: Pain after fall

EXAM:
DG HIP (WITH OR WITHOUT PELVIS) 2-3V RIGHT

[x pelvis]
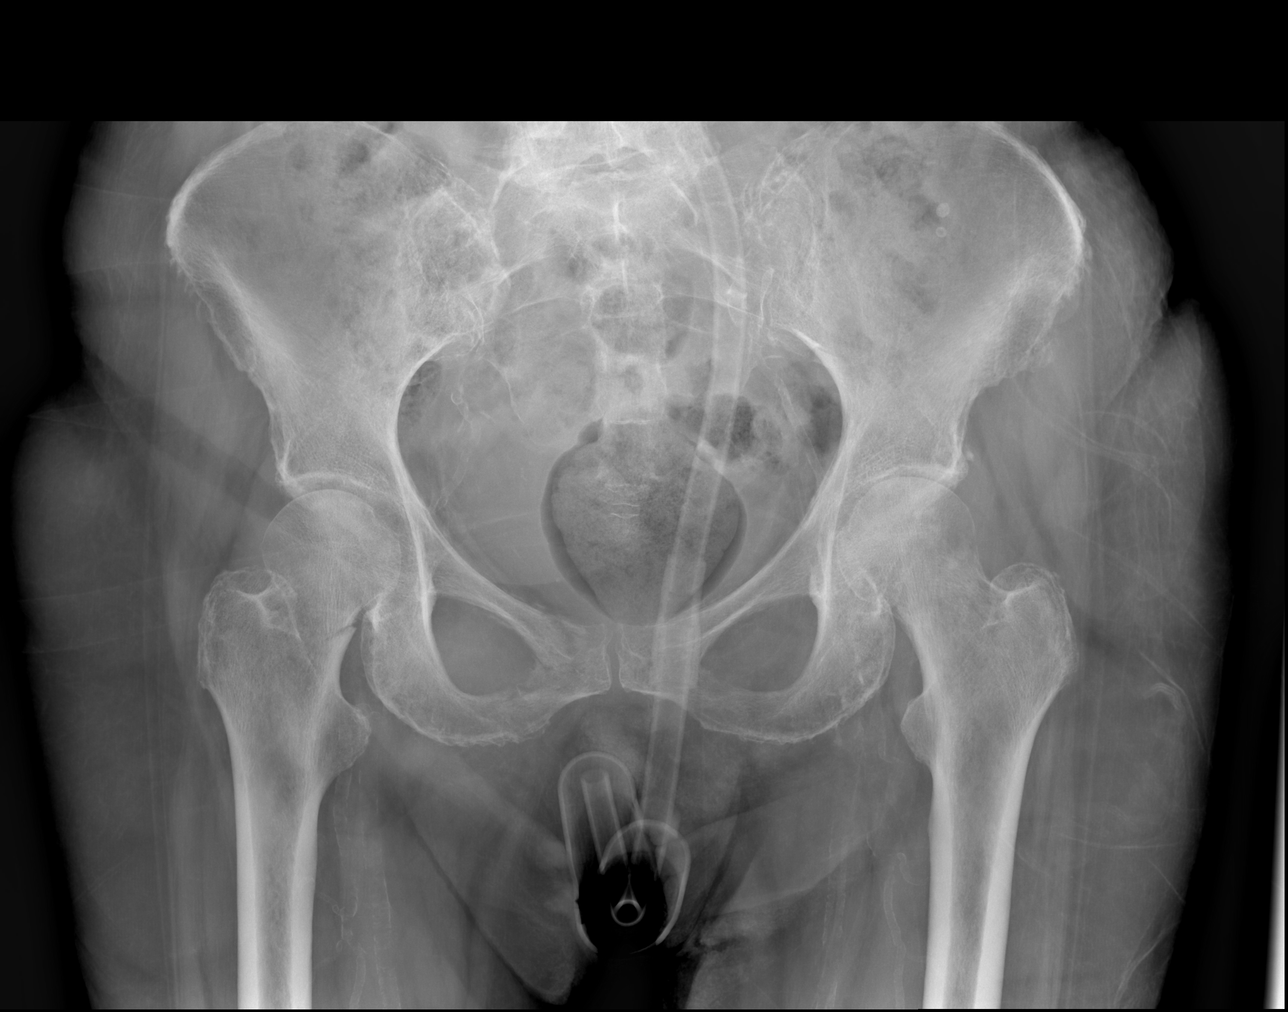

[x hip ap right]
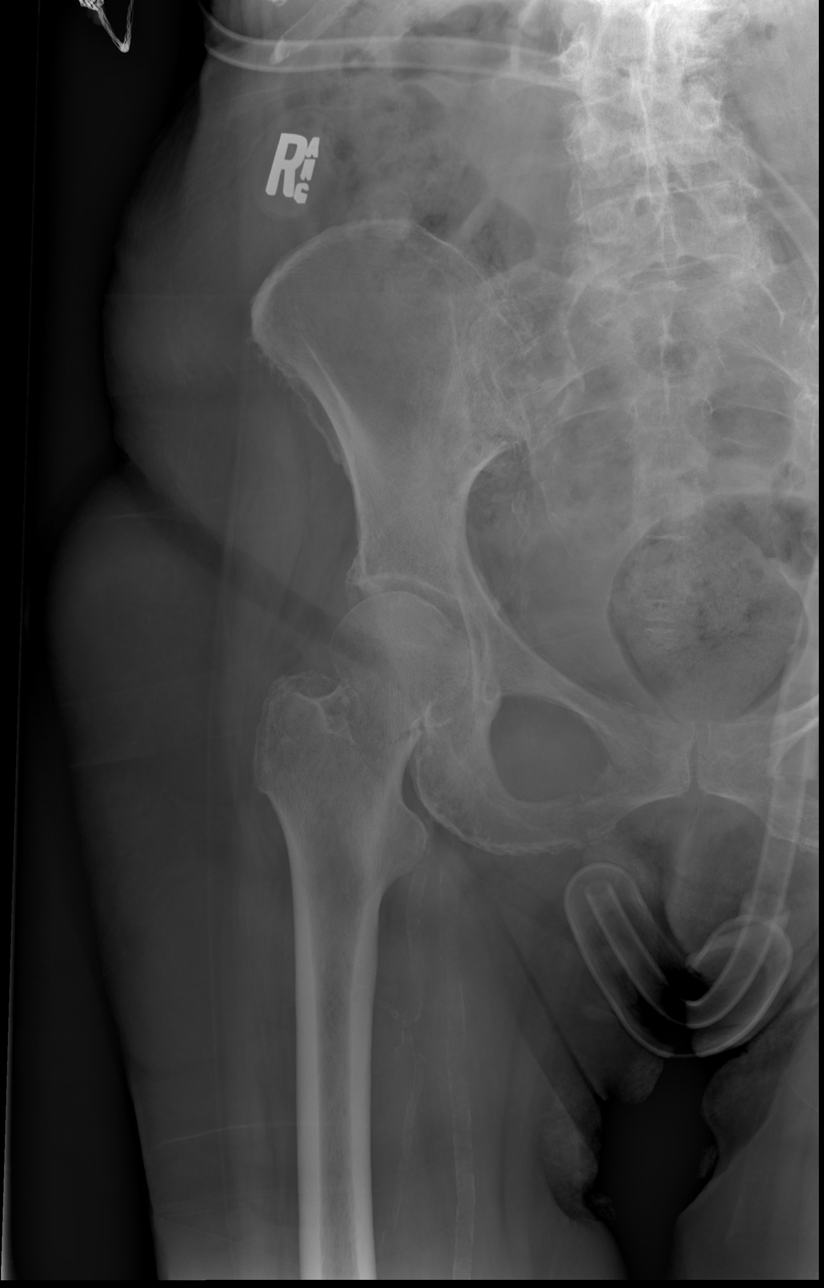

[w hip lat right (1 of 2)]
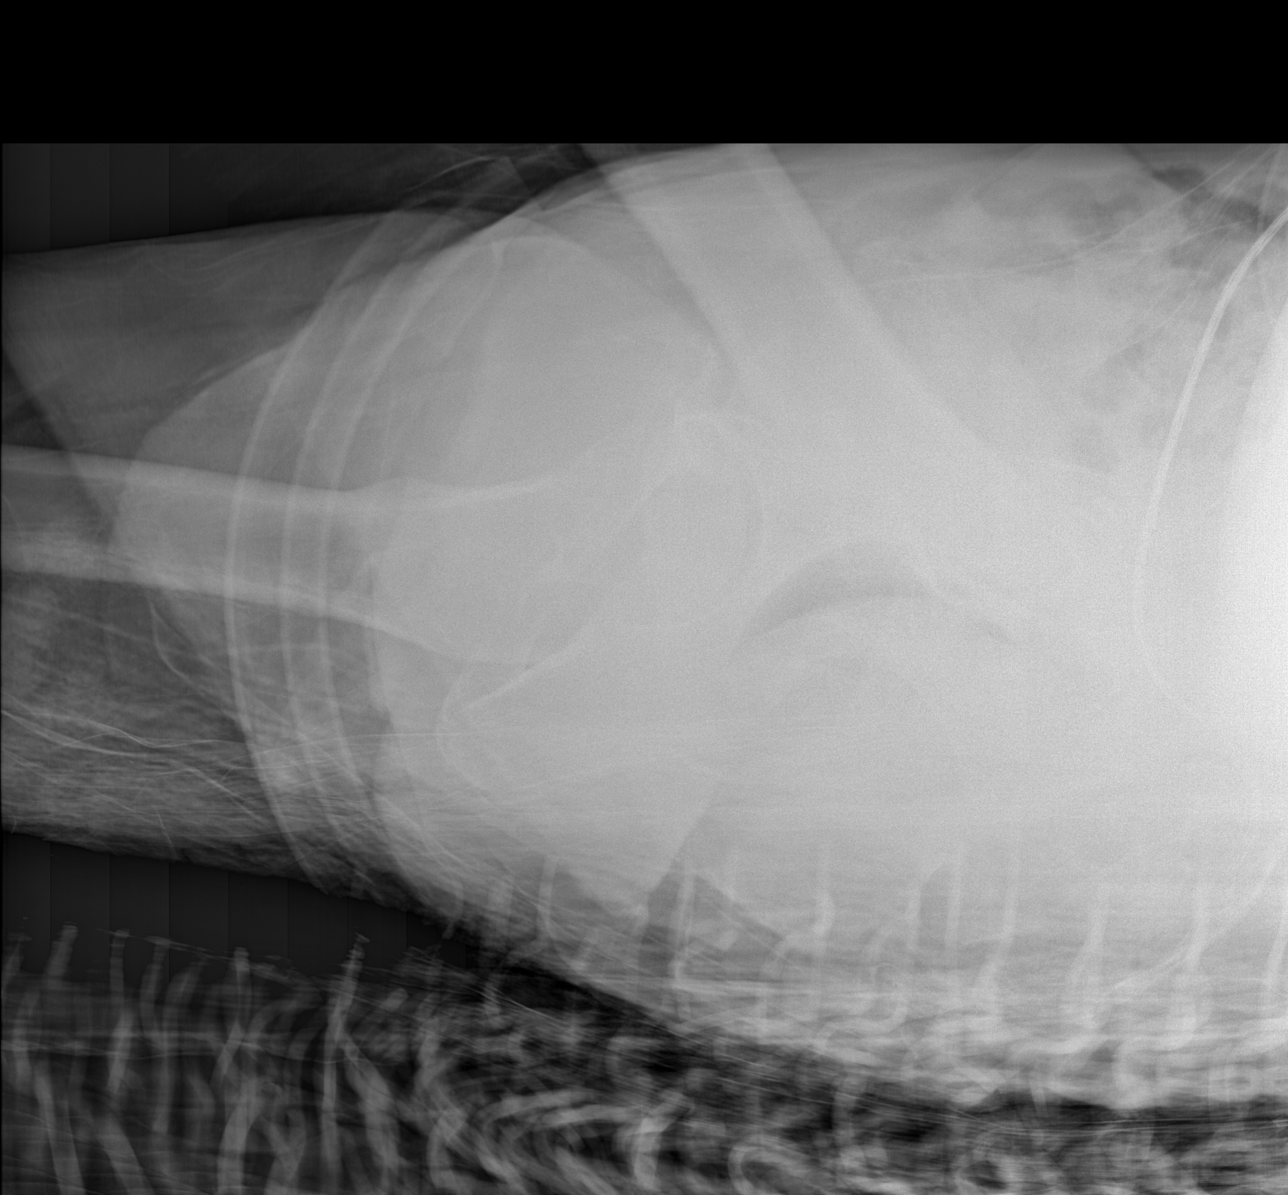

[w hip lat right (2 of 2)]
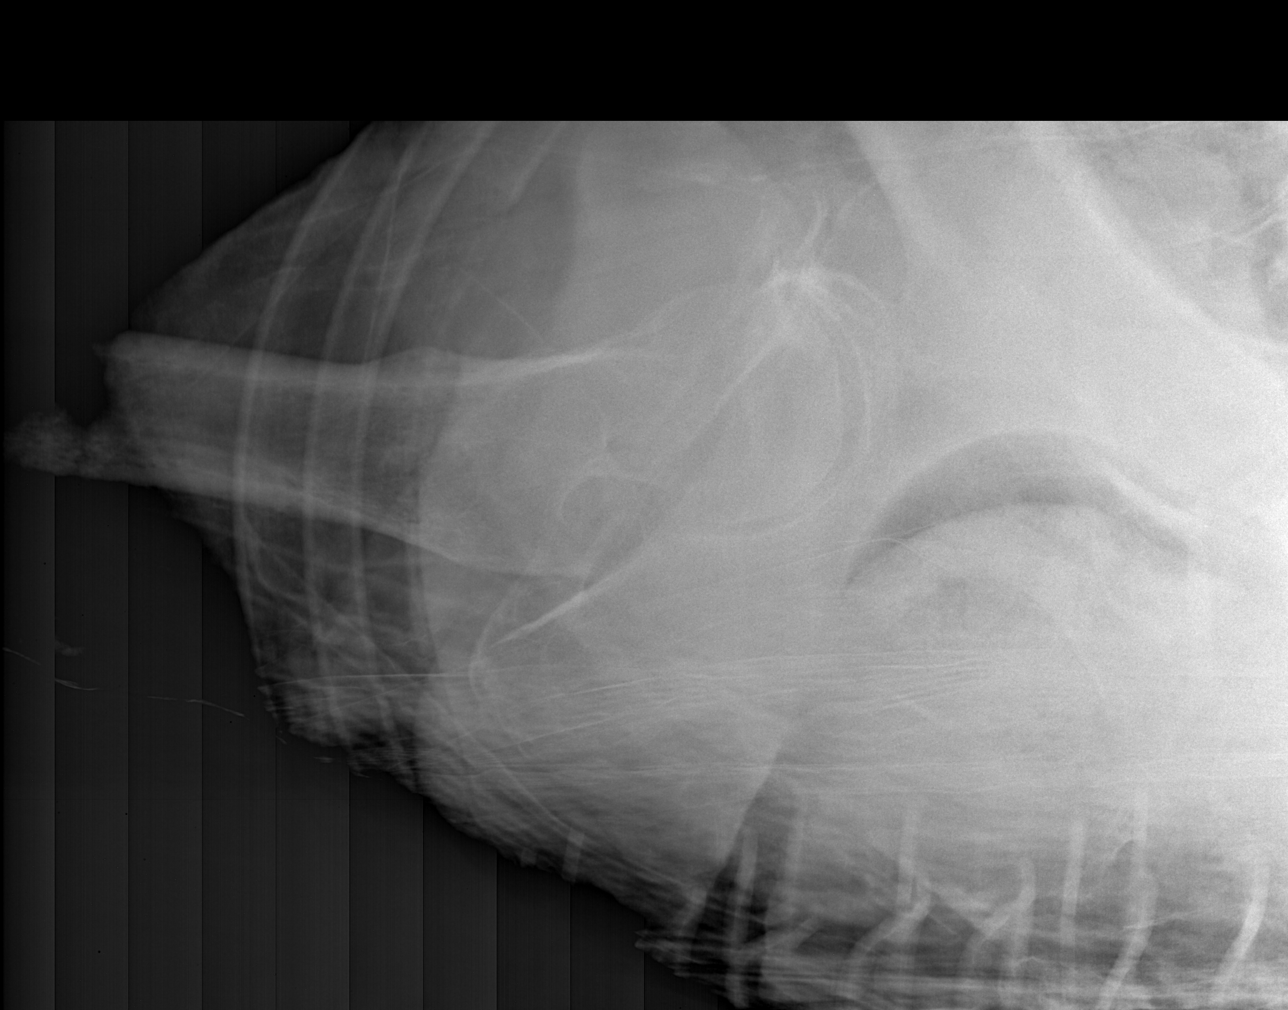

[4 of 4 positions shown; findings below may reference images not displayed]

FINDINGS: There is a nondisplaced fracture through the right femoral neck
without femoral head dislocation. There also appear to be 2
nondisplaced fractures through the medial right inferior pubic
ramus, apparently extending into the pubic symphysis. No other acute
abnormalities are identified.
IMPRESSION: 1. Nondisplaced fracture through the right femoral neck.
2. 2 nondisplaced fractures through the medial right inferior pubic
ramus. One of the fracture lines extends into the pubic symphysis.

## 2021-08-30 DIAGNOSIS — E038 Other specified hypothyroidism: Secondary | ICD-10-CM | POA: Diagnosis not present

## 2021-08-30 DIAGNOSIS — E119 Type 2 diabetes mellitus without complications: Secondary | ICD-10-CM | POA: Diagnosis not present

## 2021-08-30 DIAGNOSIS — F331 Major depressive disorder, recurrent, moderate: Secondary | ICD-10-CM | POA: Diagnosis not present

## 2021-08-30 DIAGNOSIS — I10 Essential (primary) hypertension: Secondary | ICD-10-CM | POA: Diagnosis not present

## 2021-08-30 DIAGNOSIS — I509 Heart failure, unspecified: Secondary | ICD-10-CM | POA: Diagnosis not present

## 2021-08-30 DIAGNOSIS — D518 Other vitamin B12 deficiency anemias: Secondary | ICD-10-CM | POA: Diagnosis not present

## 2021-08-30 DIAGNOSIS — J449 Chronic obstructive pulmonary disease, unspecified: Secondary | ICD-10-CM | POA: Diagnosis not present

## 2021-08-30 DIAGNOSIS — F0390 Unspecified dementia without behavioral disturbance: Secondary | ICD-10-CM | POA: Diagnosis not present

## 2021-08-30 DIAGNOSIS — E785 Hyperlipidemia, unspecified: Secondary | ICD-10-CM | POA: Diagnosis not present

## 2021-09-01 IMAGING — CT CT ABD-PELV W/O CM
2 of 4 series · 15 of 46 positions shown, 17 images · non-contrast
Comparison: Nuclear medicine tagged red blood cell study 10/19/2020

CLINICAL DATA: [AGE] with abdominal pain.  GI bleeding.

EXAM:
CT ABDOMEN AND PELVIS WITHOUT CONTRAST
TECHNIQUE: Multidetector CT imaging of the abdomen and pelvis was performed
following the standard protocol without IV contrast.

[Series 2: axial st · axial · 0.86mm/px · z∈[+908,+1268]mm · 12 of 81 slices shown, 14 images]
[im 5/81  soft-tissue]
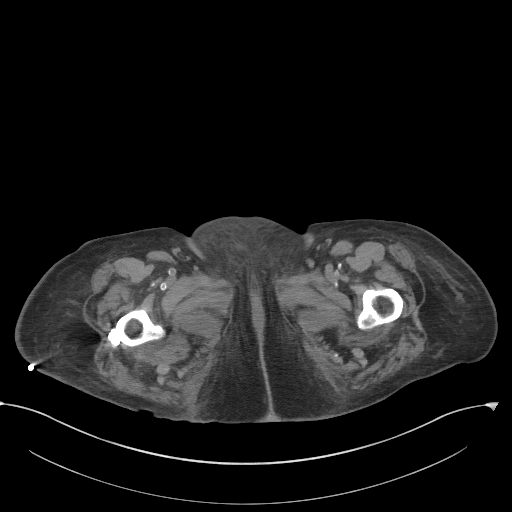
[im 5/81  bone]
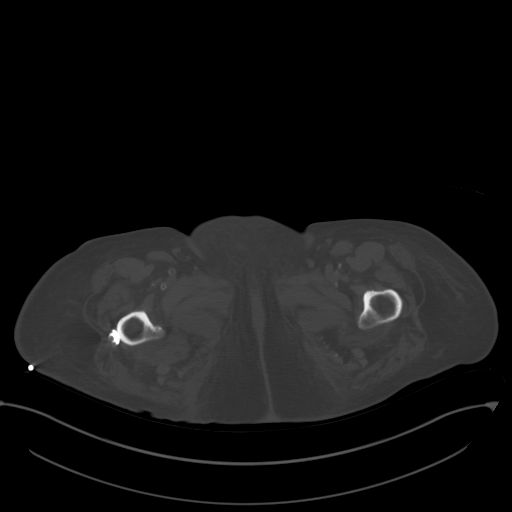
[im 13/81  soft-tissue]
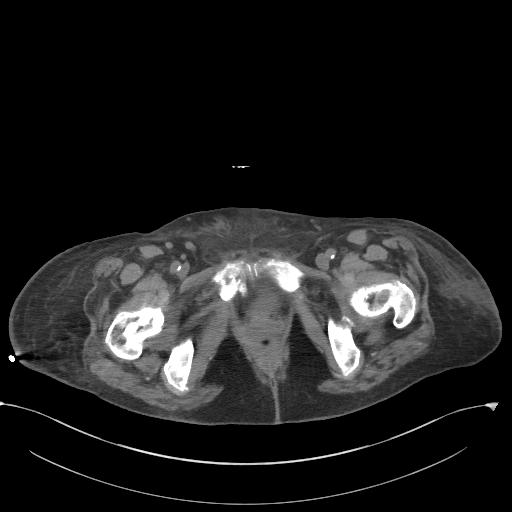
[im 17/81  soft-tissue]
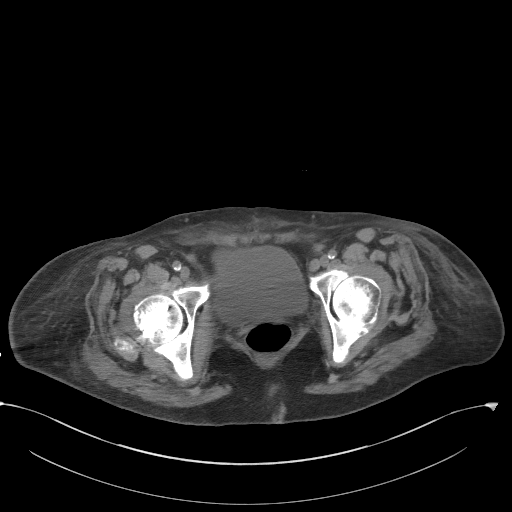
[im 25/81  soft-tissue]
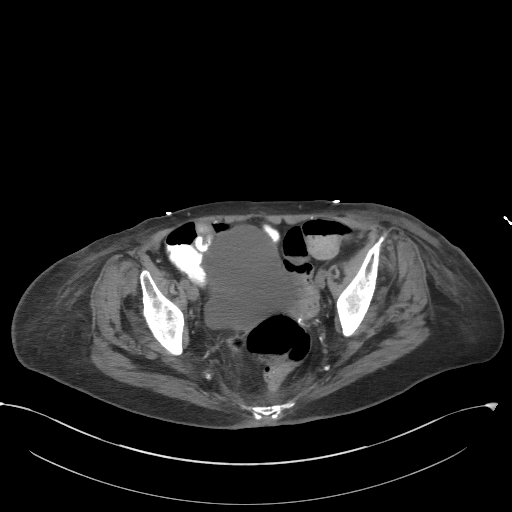
[im 33/81  soft-tissue]
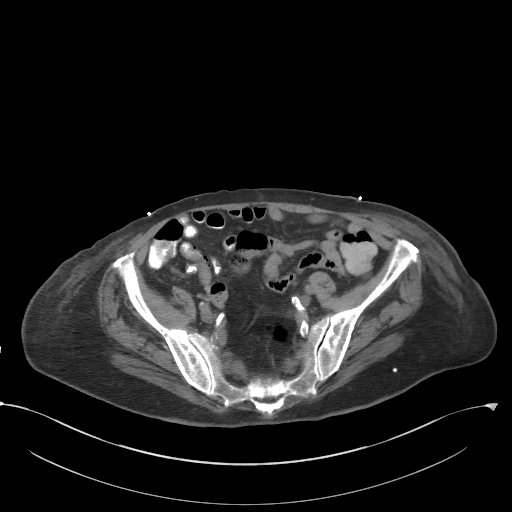
[im 37/81  soft-tissue]
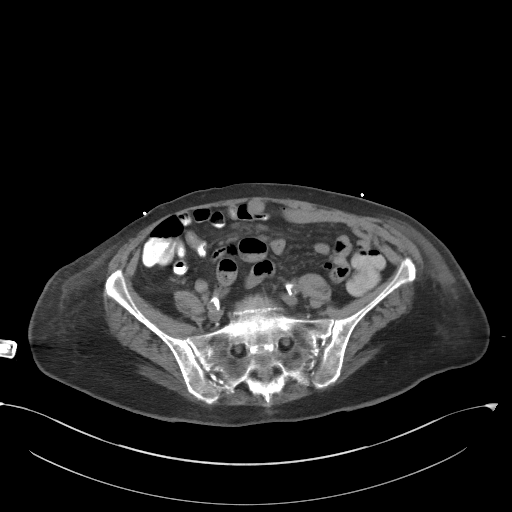
[im 45/81  soft-tissue]
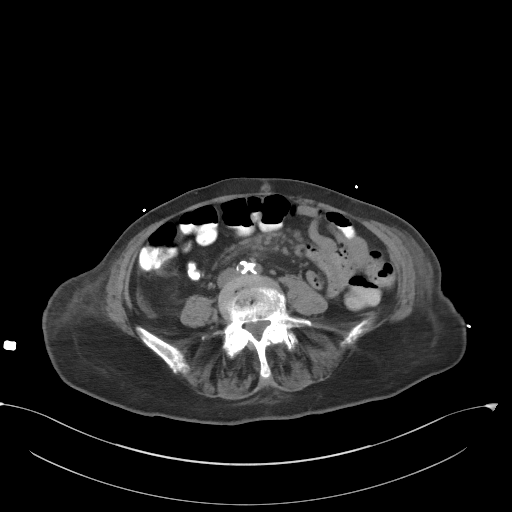
[im 49/81  soft-tissue]
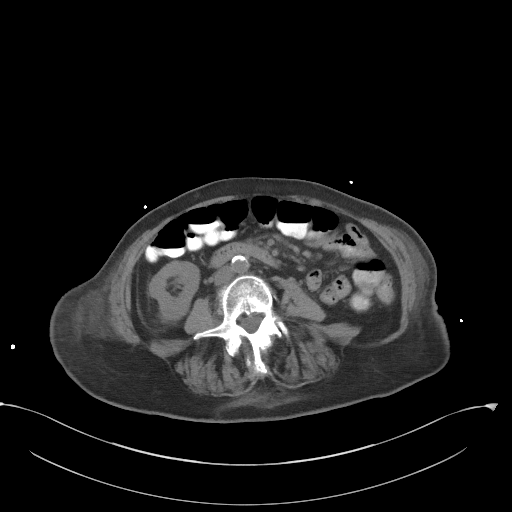
[im 57/81  soft-tissue]
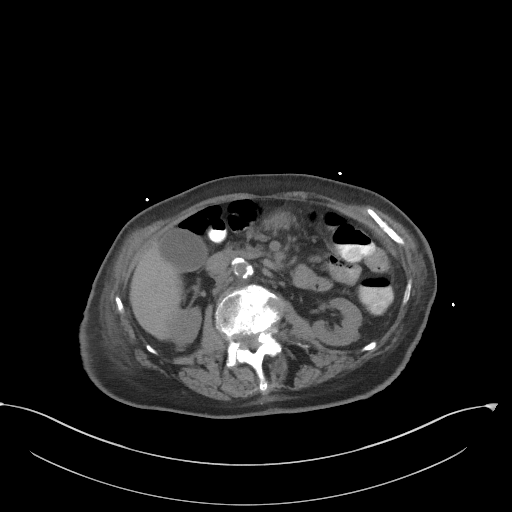
[im 57/81  bone]
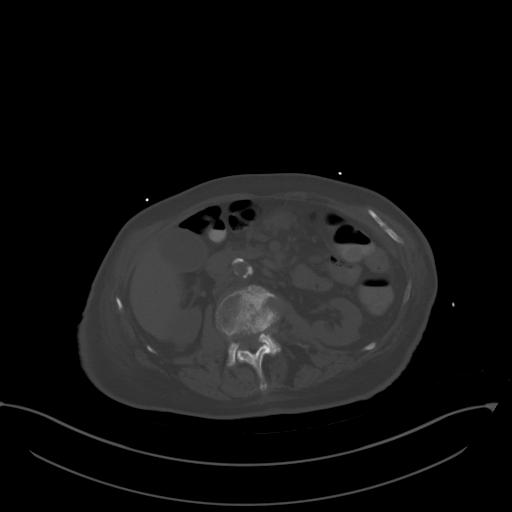
[im 65/81  soft-tissue]
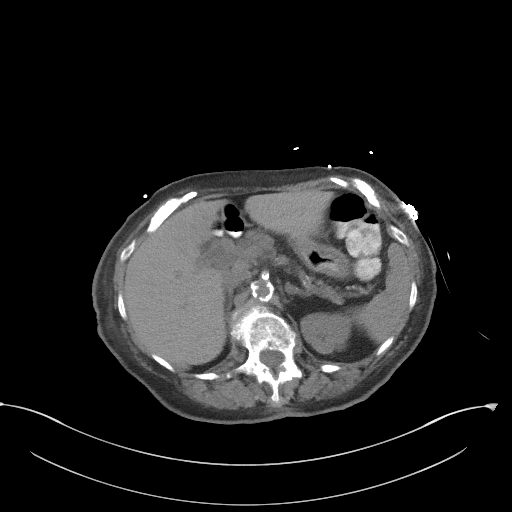
[im 69/81  soft-tissue]
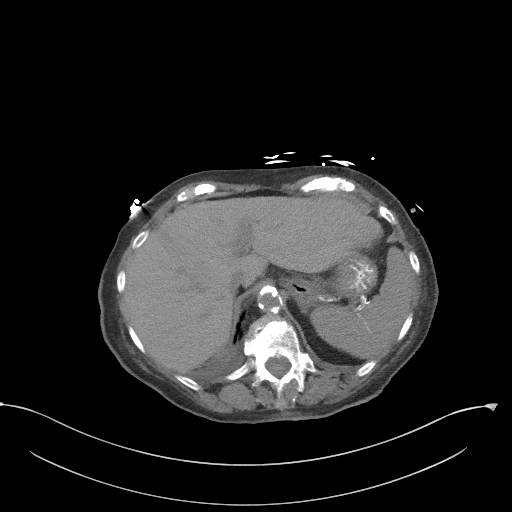
[im 77/81  soft-tissue]
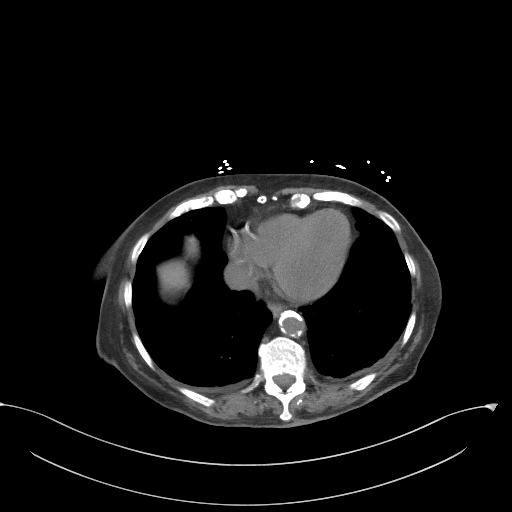

[Series 5: coronal st · coronal · 0.69mm/px · 3 of 74 slices shown]
[im 25/74  soft-tissue]
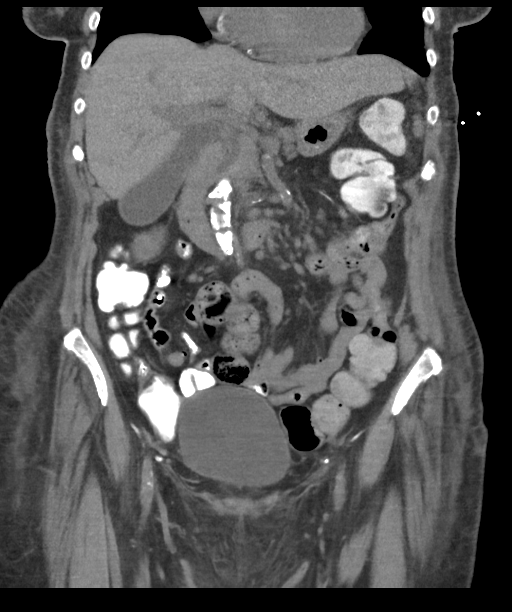
[im 33/74  soft-tissue]
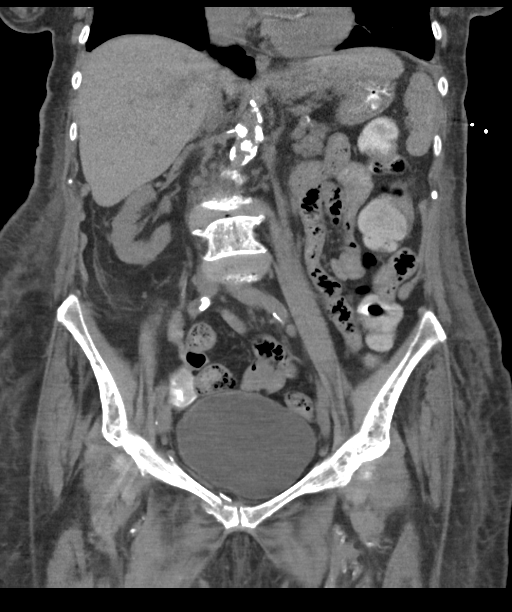
[im 41/74  soft-tissue]
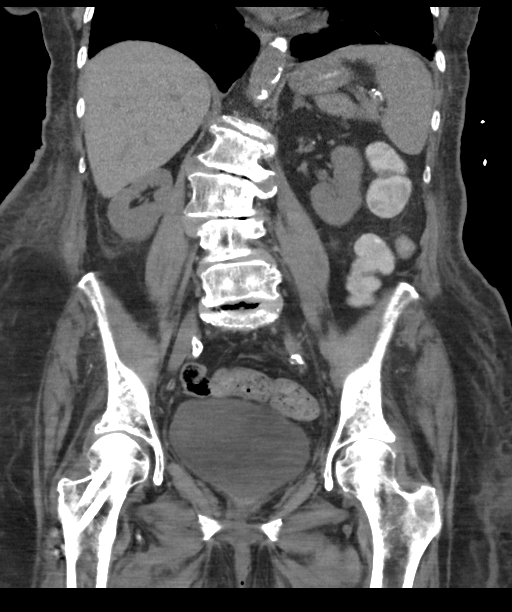

[15 of 46 positions shown; findings below may reference images not displayed]

FINDINGS: Lower chest: Trace pleural fluid, right side greater than left. Mild
bronchiectasis in the lower lobes. Patchy densities at lung bases
likely represent atelectasis or mild scarring.

Hepatobiliary: No gross abnormality to the liver. Normal appearance
of the gallbladder with mild distention.

Pancreas: Unremarkable. No pancreatic ductal dilatation or
surrounding inflammatory changes.

Spleen: Normal in size without focal abnormality.

Adrenals/Urinary Tract: Normal appearance of the adrenal glands.
Mild distention of the urinary bladder with fluid. Normal appearance
of the kidneys without hydronephrosis. No evidence for kidney
stones.

Stomach/Bowel: Oral contrast in the distal small bowel and colon. No
evidence for obstruction. Normal appearance of the stomach. No focal
bowel inflammation.

Vascular/Lymphatic: Diffuse vascular calcifications throughout the
abdomen and pelvis. Negative for an aortic aneurysm. Large number of
mesenteric lymph nodes throughout the central abdomen. These
prominent mesenteric lymph nodes are best seen on the coronal
images, sequence 5 image 24 and on the axial images, sequence 2
image 42. No significant lymph node enlargement around the aorta or
iliac vessels. No significant inguinal lymph node enlargement.

Reproductive: Uterus appears to be surgically absent. No evidence
for an adnexal mass.

Other: Diffuse subcutaneous edema. Evidence for presacral edema.
Negative for free air. Trace fluid in the right paracolic gutter.

Musculoskeletal: 3 surgical screws extending through the proximal
right femur. Fractures involving the right superior and inferior
pubic rami. Mild anterolisthesis at L4-L5. Extensive facet
arthropathy lumbar spine. Minimal retrolisthesis at L3-L4.
Multilevel disc space narrowing and endplate disease in the lumbar
spine.
IMPRESSION: 1. No acute abnormality to explain abdominal pain.
2. Trace pleural fluid with subcutaneous edema and minimal fluid in
the right paracolic gutter.
3. Prominent central mesenteric lymph nodes. Findings are
nonspecific. This could represent reactive changes. Mild
lymphoproliferative process cannot be completely excluded.
4. Right pubic rami fracture. Postoperative changes from right hip
fracture and surgical fixation.
5.  Aortic Atherosclerosis ([id number]).

## 2021-09-08 DIAGNOSIS — I7091 Generalized atherosclerosis: Secondary | ICD-10-CM | POA: Diagnosis not present

## 2021-09-08 DIAGNOSIS — B351 Tinea unguium: Secondary | ICD-10-CM | POA: Diagnosis not present

## 2021-09-12 DIAGNOSIS — R059 Cough, unspecified: Secondary | ICD-10-CM | POA: Diagnosis not present

## 2021-09-12 DIAGNOSIS — R093 Abnormal sputum: Secondary | ICD-10-CM | POA: Diagnosis not present

## 2021-09-13 DIAGNOSIS — R059 Cough, unspecified: Secondary | ICD-10-CM | POA: Diagnosis not present

## 2021-09-17 DIAGNOSIS — J069 Acute upper respiratory infection, unspecified: Secondary | ICD-10-CM | POA: Diagnosis not present

## 2022-04-30 DIAGNOSIS — F331 Major depressive disorder, recurrent, moderate: Secondary | ICD-10-CM | POA: Diagnosis not present

## 2022-04-30 DIAGNOSIS — E038 Other specified hypothyroidism: Secondary | ICD-10-CM | POA: Diagnosis not present

## 2022-04-30 DIAGNOSIS — F0390 Unspecified dementia without behavioral disturbance: Secondary | ICD-10-CM | POA: Diagnosis not present

## 2022-04-30 DIAGNOSIS — I10 Essential (primary) hypertension: Secondary | ICD-10-CM | POA: Diagnosis not present

## 2022-04-30 DIAGNOSIS — I509 Heart failure, unspecified: Secondary | ICD-10-CM | POA: Diagnosis not present

## 2022-04-30 DIAGNOSIS — E785 Hyperlipidemia, unspecified: Secondary | ICD-10-CM | POA: Diagnosis not present

## 2022-04-30 DIAGNOSIS — D518 Other vitamin B12 deficiency anemias: Secondary | ICD-10-CM | POA: Diagnosis not present

## 2022-04-30 DIAGNOSIS — J449 Chronic obstructive pulmonary disease, unspecified: Secondary | ICD-10-CM | POA: Diagnosis not present

## 2022-04-30 DIAGNOSIS — E119 Type 2 diabetes mellitus without complications: Secondary | ICD-10-CM | POA: Diagnosis not present

## 2022-05-13 DIAGNOSIS — Z79899 Other long term (current) drug therapy: Secondary | ICD-10-CM | POA: Diagnosis not present

## 2022-05-13 DIAGNOSIS — F411 Generalized anxiety disorder: Secondary | ICD-10-CM | POA: Diagnosis not present

## 2022-05-13 DIAGNOSIS — D518 Other vitamin B12 deficiency anemias: Secondary | ICD-10-CM | POA: Diagnosis not present

## 2022-05-13 DIAGNOSIS — F5105 Insomnia due to other mental disorder: Secondary | ICD-10-CM | POA: Diagnosis not present

## 2022-05-13 DIAGNOSIS — F330 Major depressive disorder, recurrent, mild: Secondary | ICD-10-CM | POA: Diagnosis not present

## 2022-05-13 DIAGNOSIS — E119 Type 2 diabetes mellitus without complications: Secondary | ICD-10-CM | POA: Diagnosis not present

## 2022-05-13 DIAGNOSIS — E038 Other specified hypothyroidism: Secondary | ICD-10-CM | POA: Diagnosis not present

## 2022-05-13 DIAGNOSIS — E782 Mixed hyperlipidemia: Secondary | ICD-10-CM | POA: Diagnosis not present

## 2022-05-13 DIAGNOSIS — F01A0 Vascular dementia, mild, without behavioral disturbance, psychotic disturbance, mood disturbance, and anxiety: Secondary | ICD-10-CM | POA: Diagnosis not present

## 2022-05-24 DIAGNOSIS — I10 Essential (primary) hypertension: Secondary | ICD-10-CM | POA: Diagnosis not present

## 2022-05-26 DIAGNOSIS — L600 Ingrowing nail: Secondary | ICD-10-CM | POA: Diagnosis not present

## 2022-05-26 DIAGNOSIS — M79671 Pain in right foot: Secondary | ICD-10-CM | POA: Diagnosis not present

## 2022-05-26 DIAGNOSIS — B351 Tinea unguium: Secondary | ICD-10-CM | POA: Diagnosis not present

## 2022-05-26 DIAGNOSIS — M79672 Pain in left foot: Secondary | ICD-10-CM | POA: Diagnosis not present

## 2022-06-02 DIAGNOSIS — R2242 Localized swelling, mass and lump, left lower limb: Secondary | ICD-10-CM | POA: Diagnosis not present

## 2022-06-03 DIAGNOSIS — R011 Cardiac murmur, unspecified: Secondary | ICD-10-CM | POA: Diagnosis not present

## 2022-06-06 DIAGNOSIS — J449 Chronic obstructive pulmonary disease, unspecified: Secondary | ICD-10-CM | POA: Diagnosis not present

## 2022-06-06 DIAGNOSIS — F331 Major depressive disorder, recurrent, moderate: Secondary | ICD-10-CM | POA: Diagnosis not present

## 2022-06-06 DIAGNOSIS — F0390 Unspecified dementia without behavioral disturbance: Secondary | ICD-10-CM | POA: Diagnosis not present

## 2022-06-06 DIAGNOSIS — D518 Other vitamin B12 deficiency anemias: Secondary | ICD-10-CM | POA: Diagnosis not present

## 2022-06-06 DIAGNOSIS — I509 Heart failure, unspecified: Secondary | ICD-10-CM | POA: Diagnosis not present

## 2022-06-06 DIAGNOSIS — E785 Hyperlipidemia, unspecified: Secondary | ICD-10-CM | POA: Diagnosis not present

## 2022-06-06 DIAGNOSIS — E119 Type 2 diabetes mellitus without complications: Secondary | ICD-10-CM | POA: Diagnosis not present

## 2022-06-06 DIAGNOSIS — E038 Other specified hypothyroidism: Secondary | ICD-10-CM | POA: Diagnosis not present

## 2022-06-06 DIAGNOSIS — I10 Essential (primary) hypertension: Secondary | ICD-10-CM | POA: Diagnosis not present

## 2022-06-07 DIAGNOSIS — I70223 Atherosclerosis of native arteries of extremities with rest pain, bilateral legs: Secondary | ICD-10-CM | POA: Diagnosis not present

## 2022-06-08 DIAGNOSIS — F01A0 Vascular dementia, mild, without behavioral disturbance, psychotic disturbance, mood disturbance, and anxiety: Secondary | ICD-10-CM | POA: Diagnosis not present

## 2022-06-08 DIAGNOSIS — F411 Generalized anxiety disorder: Secondary | ICD-10-CM | POA: Diagnosis not present

## 2022-06-08 DIAGNOSIS — F330 Major depressive disorder, recurrent, mild: Secondary | ICD-10-CM | POA: Diagnosis not present

## 2022-06-08 DIAGNOSIS — F5105 Insomnia due to other mental disorder: Secondary | ICD-10-CM | POA: Diagnosis not present

## 2022-06-09 DIAGNOSIS — E042 Nontoxic multinodular goiter: Secondary | ICD-10-CM | POA: Diagnosis not present

## 2022-06-09 DIAGNOSIS — R221 Localized swelling, mass and lump, neck: Secondary | ICD-10-CM | POA: Diagnosis not present

## 2022-06-13 DIAGNOSIS — R309 Painful micturition, unspecified: Secondary | ICD-10-CM | POA: Diagnosis not present

## 2022-06-14 DIAGNOSIS — R590 Localized enlarged lymph nodes: Secondary | ICD-10-CM | POA: Diagnosis not present

## 2022-06-16 DIAGNOSIS — I77811 Abdominal aortic ectasia: Secondary | ICD-10-CM | POA: Diagnosis not present

## 2022-06-21 DIAGNOSIS — N390 Urinary tract infection, site not specified: Secondary | ICD-10-CM | POA: Diagnosis not present

## 2022-06-23 DIAGNOSIS — R0989 Other specified symptoms and signs involving the circulatory and respiratory systems: Secondary | ICD-10-CM | POA: Diagnosis not present

## 2022-06-23 DIAGNOSIS — I6523 Occlusion and stenosis of bilateral carotid arteries: Secondary | ICD-10-CM | POA: Diagnosis not present

## 2022-06-24 DIAGNOSIS — I10 Essential (primary) hypertension: Secondary | ICD-10-CM | POA: Diagnosis not present

## 2022-07-05 DIAGNOSIS — E038 Other specified hypothyroidism: Secondary | ICD-10-CM | POA: Diagnosis not present

## 2022-07-05 DIAGNOSIS — I509 Heart failure, unspecified: Secondary | ICD-10-CM | POA: Diagnosis not present

## 2022-07-05 DIAGNOSIS — D518 Other vitamin B12 deficiency anemias: Secondary | ICD-10-CM | POA: Diagnosis not present

## 2022-07-05 DIAGNOSIS — E119 Type 2 diabetes mellitus without complications: Secondary | ICD-10-CM | POA: Diagnosis not present

## 2022-07-05 DIAGNOSIS — E785 Hyperlipidemia, unspecified: Secondary | ICD-10-CM | POA: Diagnosis not present

## 2022-07-05 DIAGNOSIS — F0390 Unspecified dementia without behavioral disturbance: Secondary | ICD-10-CM | POA: Diagnosis not present

## 2022-07-05 DIAGNOSIS — I10 Essential (primary) hypertension: Secondary | ICD-10-CM | POA: Diagnosis not present

## 2022-07-05 DIAGNOSIS — J449 Chronic obstructive pulmonary disease, unspecified: Secondary | ICD-10-CM | POA: Diagnosis not present

## 2022-07-05 DIAGNOSIS — F331 Major depressive disorder, recurrent, moderate: Secondary | ICD-10-CM | POA: Diagnosis not present

## 2022-07-06 DIAGNOSIS — F01A0 Vascular dementia, mild, without behavioral disturbance, psychotic disturbance, mood disturbance, and anxiety: Secondary | ICD-10-CM | POA: Diagnosis not present

## 2022-07-06 DIAGNOSIS — F5105 Insomnia due to other mental disorder: Secondary | ICD-10-CM | POA: Diagnosis not present

## 2022-07-06 DIAGNOSIS — F411 Generalized anxiety disorder: Secondary | ICD-10-CM | POA: Diagnosis not present

## 2022-07-06 DIAGNOSIS — F330 Major depressive disorder, recurrent, mild: Secondary | ICD-10-CM | POA: Diagnosis not present

## 2022-07-20 DIAGNOSIS — F330 Major depressive disorder, recurrent, mild: Secondary | ICD-10-CM | POA: Diagnosis not present

## 2022-07-20 DIAGNOSIS — F01A0 Vascular dementia, mild, without behavioral disturbance, psychotic disturbance, mood disturbance, and anxiety: Secondary | ICD-10-CM | POA: Diagnosis not present

## 2022-07-20 DIAGNOSIS — F5105 Insomnia due to other mental disorder: Secondary | ICD-10-CM | POA: Diagnosis not present

## 2022-07-20 DIAGNOSIS — F411 Generalized anxiety disorder: Secondary | ICD-10-CM | POA: Diagnosis not present

## 2022-07-21 DIAGNOSIS — S0083XA Contusion of other part of head, initial encounter: Secondary | ICD-10-CM | POA: Diagnosis not present

## 2022-07-21 DIAGNOSIS — Z79899 Other long term (current) drug therapy: Secondary | ICD-10-CM | POA: Diagnosis not present

## 2022-07-21 DIAGNOSIS — S0510XA Contusion of eyeball and orbital tissues, unspecified eye, initial encounter: Secondary | ICD-10-CM | POA: Diagnosis not present

## 2022-07-21 DIAGNOSIS — S62002A Unspecified fracture of navicular [scaphoid] bone of left wrist, initial encounter for closed fracture: Secondary | ICD-10-CM | POA: Diagnosis not present

## 2022-07-21 DIAGNOSIS — Z043 Encounter for examination and observation following other accident: Secondary | ICD-10-CM | POA: Diagnosis not present

## 2022-07-21 DIAGNOSIS — R531 Weakness: Secondary | ICD-10-CM | POA: Diagnosis not present

## 2022-07-21 DIAGNOSIS — S52502A Unspecified fracture of the lower end of left radius, initial encounter for closed fracture: Secondary | ICD-10-CM | POA: Diagnosis not present

## 2022-07-21 DIAGNOSIS — G4489 Other headache syndrome: Secondary | ICD-10-CM | POA: Diagnosis not present

## 2022-07-21 DIAGNOSIS — S0181XA Laceration without foreign body of other part of head, initial encounter: Secondary | ICD-10-CM | POA: Diagnosis not present

## 2022-07-21 DIAGNOSIS — S52592A Other fractures of lower end of left radius, initial encounter for closed fracture: Secondary | ICD-10-CM | POA: Diagnosis not present

## 2022-07-21 DIAGNOSIS — Z7401 Bed confinement status: Secondary | ICD-10-CM | POA: Diagnosis not present

## 2022-07-21 DIAGNOSIS — W19XXXA Unspecified fall, initial encounter: Secondary | ICD-10-CM | POA: Diagnosis not present

## 2022-07-22 DIAGNOSIS — G2581 Restless legs syndrome: Secondary | ICD-10-CM | POA: Diagnosis not present

## 2022-07-22 DIAGNOSIS — I509 Heart failure, unspecified: Secondary | ICD-10-CM | POA: Diagnosis not present

## 2022-07-22 DIAGNOSIS — R300 Dysuria: Secondary | ICD-10-CM | POA: Diagnosis not present

## 2022-07-22 DIAGNOSIS — G894 Chronic pain syndrome: Secondary | ICD-10-CM | POA: Diagnosis not present

## 2022-07-22 DIAGNOSIS — F0390 Unspecified dementia without behavioral disturbance: Secondary | ICD-10-CM | POA: Diagnosis not present

## 2022-07-22 DIAGNOSIS — K219 Gastro-esophageal reflux disease without esophagitis: Secondary | ICD-10-CM | POA: Diagnosis not present

## 2022-07-22 DIAGNOSIS — I10 Essential (primary) hypertension: Secondary | ICD-10-CM | POA: Diagnosis not present

## 2022-07-22 DIAGNOSIS — J449 Chronic obstructive pulmonary disease, unspecified: Secondary | ICD-10-CM | POA: Diagnosis not present

## 2022-07-22 DIAGNOSIS — E785 Hyperlipidemia, unspecified: Secondary | ICD-10-CM | POA: Diagnosis not present

## 2022-07-25 DIAGNOSIS — I10 Essential (primary) hypertension: Secondary | ICD-10-CM | POA: Diagnosis not present

## 2022-07-27 DIAGNOSIS — N390 Urinary tract infection, site not specified: Secondary | ICD-10-CM | POA: Diagnosis not present

## 2022-08-01 DIAGNOSIS — Z4802 Encounter for removal of sutures: Secondary | ICD-10-CM | POA: Diagnosis not present

## 2022-08-05 DIAGNOSIS — I70223 Atherosclerosis of native arteries of extremities with rest pain, bilateral legs: Secondary | ICD-10-CM | POA: Diagnosis not present

## 2022-08-05 DIAGNOSIS — E7849 Other hyperlipidemia: Secondary | ICD-10-CM | POA: Diagnosis not present

## 2022-08-05 DIAGNOSIS — F0390 Unspecified dementia without behavioral disturbance: Secondary | ICD-10-CM | POA: Diagnosis not present

## 2022-08-05 DIAGNOSIS — D518 Other vitamin B12 deficiency anemias: Secondary | ICD-10-CM | POA: Diagnosis not present

## 2022-08-05 DIAGNOSIS — E559 Vitamin D deficiency, unspecified: Secondary | ICD-10-CM | POA: Diagnosis not present

## 2022-08-05 DIAGNOSIS — I509 Heart failure, unspecified: Secondary | ICD-10-CM | POA: Diagnosis not present

## 2022-08-05 DIAGNOSIS — J449 Chronic obstructive pulmonary disease, unspecified: Secondary | ICD-10-CM | POA: Diagnosis not present

## 2022-08-05 DIAGNOSIS — I10 Essential (primary) hypertension: Secondary | ICD-10-CM | POA: Diagnosis not present

## 2022-08-05 DIAGNOSIS — E038 Other specified hypothyroidism: Secondary | ICD-10-CM | POA: Diagnosis not present

## 2022-08-09 DIAGNOSIS — D518 Other vitamin B12 deficiency anemias: Secondary | ICD-10-CM | POA: Diagnosis not present

## 2022-08-09 DIAGNOSIS — E782 Mixed hyperlipidemia: Secondary | ICD-10-CM | POA: Diagnosis not present

## 2022-08-09 DIAGNOSIS — Z79899 Other long term (current) drug therapy: Secondary | ICD-10-CM | POA: Diagnosis not present

## 2022-08-09 DIAGNOSIS — E119 Type 2 diabetes mellitus without complications: Secondary | ICD-10-CM | POA: Diagnosis not present

## 2022-08-09 DIAGNOSIS — E038 Other specified hypothyroidism: Secondary | ICD-10-CM | POA: Diagnosis not present

## 2022-08-17 DIAGNOSIS — F330 Major depressive disorder, recurrent, mild: Secondary | ICD-10-CM | POA: Diagnosis not present

## 2022-08-17 DIAGNOSIS — F5105 Insomnia due to other mental disorder: Secondary | ICD-10-CM | POA: Diagnosis not present

## 2022-08-17 DIAGNOSIS — F411 Generalized anxiety disorder: Secondary | ICD-10-CM | POA: Diagnosis not present

## 2022-08-17 DIAGNOSIS — F01A0 Vascular dementia, mild, without behavioral disturbance, psychotic disturbance, mood disturbance, and anxiety: Secondary | ICD-10-CM | POA: Diagnosis not present

## 2022-08-19 DIAGNOSIS — G2581 Restless legs syndrome: Secondary | ICD-10-CM | POA: Diagnosis not present

## 2022-08-19 DIAGNOSIS — I509 Heart failure, unspecified: Secondary | ICD-10-CM | POA: Diagnosis not present

## 2022-08-19 DIAGNOSIS — F331 Major depressive disorder, recurrent, moderate: Secondary | ICD-10-CM | POA: Diagnosis not present

## 2022-08-19 DIAGNOSIS — R300 Dysuria: Secondary | ICD-10-CM | POA: Diagnosis not present

## 2022-08-19 DIAGNOSIS — K219 Gastro-esophageal reflux disease without esophagitis: Secondary | ICD-10-CM | POA: Diagnosis not present

## 2022-08-19 DIAGNOSIS — I10 Essential (primary) hypertension: Secondary | ICD-10-CM | POA: Diagnosis not present

## 2022-08-19 DIAGNOSIS — J449 Chronic obstructive pulmonary disease, unspecified: Secondary | ICD-10-CM | POA: Diagnosis not present

## 2022-08-19 DIAGNOSIS — E785 Hyperlipidemia, unspecified: Secondary | ICD-10-CM | POA: Diagnosis not present

## 2022-08-19 DIAGNOSIS — F0390 Unspecified dementia without behavioral disturbance: Secondary | ICD-10-CM | POA: Diagnosis not present

## 2022-08-19 DIAGNOSIS — M5030 Other cervical disc degeneration, unspecified cervical region: Secondary | ICD-10-CM | POA: Diagnosis not present

## 2022-08-24 DIAGNOSIS — N390 Urinary tract infection, site not specified: Secondary | ICD-10-CM | POA: Diagnosis not present

## 2022-09-02 DIAGNOSIS — I509 Heart failure, unspecified: Secondary | ICD-10-CM | POA: Diagnosis not present

## 2022-09-02 DIAGNOSIS — E559 Vitamin D deficiency, unspecified: Secondary | ICD-10-CM | POA: Diagnosis not present

## 2022-09-02 DIAGNOSIS — D518 Other vitamin B12 deficiency anemias: Secondary | ICD-10-CM | POA: Diagnosis not present

## 2022-09-02 DIAGNOSIS — E785 Hyperlipidemia, unspecified: Secondary | ICD-10-CM | POA: Diagnosis not present

## 2022-09-02 DIAGNOSIS — I10 Essential (primary) hypertension: Secondary | ICD-10-CM | POA: Diagnosis not present

## 2022-09-02 DIAGNOSIS — J449 Chronic obstructive pulmonary disease, unspecified: Secondary | ICD-10-CM | POA: Diagnosis not present

## 2022-09-02 DIAGNOSIS — E038 Other specified hypothyroidism: Secondary | ICD-10-CM | POA: Diagnosis not present

## 2022-09-02 DIAGNOSIS — F331 Major depressive disorder, recurrent, moderate: Secondary | ICD-10-CM | POA: Diagnosis not present

## 2022-09-02 DIAGNOSIS — E119 Type 2 diabetes mellitus without complications: Secondary | ICD-10-CM | POA: Diagnosis not present

## 2022-09-07 DIAGNOSIS — F330 Major depressive disorder, recurrent, mild: Secondary | ICD-10-CM | POA: Diagnosis not present

## 2022-09-07 DIAGNOSIS — F411 Generalized anxiety disorder: Secondary | ICD-10-CM | POA: Diagnosis not present

## 2022-09-07 DIAGNOSIS — F5105 Insomnia due to other mental disorder: Secondary | ICD-10-CM | POA: Diagnosis not present

## 2022-09-07 DIAGNOSIS — F01A0 Vascular dementia, mild, without behavioral disturbance, psychotic disturbance, mood disturbance, and anxiety: Secondary | ICD-10-CM | POA: Diagnosis not present

## 2022-09-16 DIAGNOSIS — R42 Dizziness and giddiness: Secondary | ICD-10-CM | POA: Diagnosis not present

## 2022-09-16 DIAGNOSIS — K219 Gastro-esophageal reflux disease without esophagitis: Secondary | ICD-10-CM | POA: Diagnosis not present

## 2022-09-16 DIAGNOSIS — I509 Heart failure, unspecified: Secondary | ICD-10-CM | POA: Diagnosis not present

## 2022-09-16 DIAGNOSIS — J449 Chronic obstructive pulmonary disease, unspecified: Secondary | ICD-10-CM | POA: Diagnosis not present

## 2022-09-16 DIAGNOSIS — M479 Spondylosis, unspecified: Secondary | ICD-10-CM | POA: Diagnosis not present

## 2022-09-16 DIAGNOSIS — M5030 Other cervical disc degeneration, unspecified cervical region: Secondary | ICD-10-CM | POA: Diagnosis not present

## 2022-09-16 DIAGNOSIS — R300 Dysuria: Secondary | ICD-10-CM | POA: Diagnosis not present

## 2022-09-16 DIAGNOSIS — E785 Hyperlipidemia, unspecified: Secondary | ICD-10-CM | POA: Diagnosis not present

## 2022-09-16 DIAGNOSIS — I739 Peripheral vascular disease, unspecified: Secondary | ICD-10-CM | POA: Diagnosis not present

## 2022-09-17 DIAGNOSIS — R300 Dysuria: Secondary | ICD-10-CM | POA: Diagnosis not present

## 2022-09-20 DIAGNOSIS — L600 Ingrowing nail: Secondary | ICD-10-CM | POA: Diagnosis not present

## 2022-09-20 DIAGNOSIS — M79671 Pain in right foot: Secondary | ICD-10-CM | POA: Diagnosis not present

## 2022-09-20 DIAGNOSIS — M79672 Pain in left foot: Secondary | ICD-10-CM | POA: Diagnosis not present

## 2022-09-20 DIAGNOSIS — B351 Tinea unguium: Secondary | ICD-10-CM | POA: Diagnosis not present

## 2022-09-21 DIAGNOSIS — N390 Urinary tract infection, site not specified: Secondary | ICD-10-CM | POA: Diagnosis not present

## 2022-09-29 DIAGNOSIS — J449 Chronic obstructive pulmonary disease, unspecified: Secondary | ICD-10-CM | POA: Diagnosis not present

## 2022-09-29 DIAGNOSIS — E119 Type 2 diabetes mellitus without complications: Secondary | ICD-10-CM | POA: Diagnosis not present

## 2022-09-29 DIAGNOSIS — E038 Other specified hypothyroidism: Secondary | ICD-10-CM | POA: Diagnosis not present

## 2022-09-29 DIAGNOSIS — I10 Essential (primary) hypertension: Secondary | ICD-10-CM | POA: Diagnosis not present

## 2022-09-29 DIAGNOSIS — M479 Spondylosis, unspecified: Secondary | ICD-10-CM | POA: Diagnosis not present

## 2022-09-29 DIAGNOSIS — D518 Other vitamin B12 deficiency anemias: Secondary | ICD-10-CM | POA: Diagnosis not present

## 2022-09-29 DIAGNOSIS — E559 Vitamin D deficiency, unspecified: Secondary | ICD-10-CM | POA: Diagnosis not present

## 2022-09-29 DIAGNOSIS — F331 Major depressive disorder, recurrent, moderate: Secondary | ICD-10-CM | POA: Diagnosis not present

## 2022-09-29 DIAGNOSIS — I509 Heart failure, unspecified: Secondary | ICD-10-CM | POA: Diagnosis not present

## 2022-10-03 DIAGNOSIS — S52502A Unspecified fracture of the lower end of left radius, initial encounter for closed fracture: Secondary | ICD-10-CM | POA: Diagnosis not present

## 2022-10-05 DIAGNOSIS — F5105 Insomnia due to other mental disorder: Secondary | ICD-10-CM | POA: Diagnosis not present

## 2022-10-05 DIAGNOSIS — F330 Major depressive disorder, recurrent, mild: Secondary | ICD-10-CM | POA: Diagnosis not present

## 2022-10-05 DIAGNOSIS — F01A0 Vascular dementia, mild, without behavioral disturbance, psychotic disturbance, mood disturbance, and anxiety: Secondary | ICD-10-CM | POA: Diagnosis not present

## 2022-10-05 DIAGNOSIS — F411 Generalized anxiety disorder: Secondary | ICD-10-CM | POA: Diagnosis not present

## 2022-10-14 DIAGNOSIS — J302 Other seasonal allergic rhinitis: Secondary | ICD-10-CM | POA: Diagnosis not present

## 2022-10-14 DIAGNOSIS — I509 Heart failure, unspecified: Secondary | ICD-10-CM | POA: Diagnosis not present

## 2022-10-14 DIAGNOSIS — G894 Chronic pain syndrome: Secondary | ICD-10-CM | POA: Diagnosis not present

## 2022-10-14 DIAGNOSIS — G2581 Restless legs syndrome: Secondary | ICD-10-CM | POA: Diagnosis not present

## 2022-10-14 DIAGNOSIS — R300 Dysuria: Secondary | ICD-10-CM | POA: Diagnosis not present

## 2022-10-14 DIAGNOSIS — F331 Major depressive disorder, recurrent, moderate: Secondary | ICD-10-CM | POA: Diagnosis not present

## 2022-10-14 DIAGNOSIS — I10 Essential (primary) hypertension: Secondary | ICD-10-CM | POA: Diagnosis not present

## 2022-10-14 DIAGNOSIS — M479 Spondylosis, unspecified: Secondary | ICD-10-CM | POA: Diagnosis not present

## 2022-10-14 DIAGNOSIS — K219 Gastro-esophageal reflux disease without esophagitis: Secondary | ICD-10-CM | POA: Diagnosis not present

## 2022-10-14 DIAGNOSIS — R42 Dizziness and giddiness: Secondary | ICD-10-CM | POA: Diagnosis not present

## 2022-10-17 DIAGNOSIS — N390 Urinary tract infection, site not specified: Secondary | ICD-10-CM | POA: Diagnosis not present

## 2022-10-19 DIAGNOSIS — F330 Major depressive disorder, recurrent, mild: Secondary | ICD-10-CM | POA: Diagnosis not present

## 2022-10-19 DIAGNOSIS — F411 Generalized anxiety disorder: Secondary | ICD-10-CM | POA: Diagnosis not present

## 2022-10-19 DIAGNOSIS — F01A0 Vascular dementia, mild, without behavioral disturbance, psychotic disturbance, mood disturbance, and anxiety: Secondary | ICD-10-CM | POA: Diagnosis not present

## 2022-10-19 DIAGNOSIS — F5105 Insomnia due to other mental disorder: Secondary | ICD-10-CM | POA: Diagnosis not present

## 2022-10-23 DIAGNOSIS — I10 Essential (primary) hypertension: Secondary | ICD-10-CM | POA: Diagnosis not present

## 2022-11-10 ENCOUNTER — Telehealth: Payer: Self-pay | Admitting: *Deleted

## 2022-11-10 NOTE — Telephone Encounter (Signed)
Transition Care Management Unsuccessful Follow-up Telephone Call  Date of discharge and from where:  [facility] 11/06/2022  Attempts:  1st Attempt  Reason for unsuccessful TCM follow-up call In assisted living

## 2023-09-28 DEATH — deceased
